# Patient Record
Sex: Male | Born: 1950 | Race: White | Hispanic: No | Marital: Married | Smoking: Never smoker
Health system: Southern US, Community
[De-identification: ages and names within clinical notes are randomized; demographics above are authoritative.]

## PROBLEM LIST (undated history)

## (undated) DIAGNOSIS — G629 Polyneuropathy, unspecified: Secondary | ICD-10-CM

## (undated) DIAGNOSIS — Z8679 Personal history of other diseases of the circulatory system: Secondary | ICD-10-CM

## (undated) DIAGNOSIS — E039 Hypothyroidism, unspecified: Secondary | ICD-10-CM

## (undated) DIAGNOSIS — G8929 Other chronic pain: Secondary | ICD-10-CM

## (undated) DIAGNOSIS — M21371 Foot drop, right foot: Secondary | ICD-10-CM

## (undated) DIAGNOSIS — N390 Urinary tract infection, site not specified: Secondary | ICD-10-CM

## (undated) DIAGNOSIS — E785 Hyperlipidemia, unspecified: Secondary | ICD-10-CM

## (undated) DIAGNOSIS — M21372 Foot drop, left foot: Secondary | ICD-10-CM

## (undated) DIAGNOSIS — F1120 Opioid dependence, uncomplicated: Secondary | ICD-10-CM

## (undated) DIAGNOSIS — F419 Anxiety disorder, unspecified: Secondary | ICD-10-CM

## (undated) DIAGNOSIS — I82409 Acute embolism and thrombosis of unspecified deep veins of unspecified lower extremity: Secondary | ICD-10-CM

## (undated) DIAGNOSIS — I509 Heart failure, unspecified: Secondary | ICD-10-CM

## (undated) DIAGNOSIS — E669 Obesity, unspecified: Secondary | ICD-10-CM

## (undated) DIAGNOSIS — F329 Major depressive disorder, single episode, unspecified: Secondary | ICD-10-CM

## (undated) DIAGNOSIS — R569 Unspecified convulsions: Secondary | ICD-10-CM

## (undated) DIAGNOSIS — R0602 Shortness of breath: Secondary | ICD-10-CM

## (undated) DIAGNOSIS — M8700 Idiopathic aseptic necrosis of unspecified bone: Secondary | ICD-10-CM

## (undated) DIAGNOSIS — K219 Gastro-esophageal reflux disease without esophagitis: Secondary | ICD-10-CM

## (undated) DIAGNOSIS — M549 Dorsalgia, unspecified: Secondary | ICD-10-CM

## (undated) DIAGNOSIS — R51 Headache: Secondary | ICD-10-CM

## (undated) DIAGNOSIS — M1990 Unspecified osteoarthritis, unspecified site: Secondary | ICD-10-CM

## (undated) DIAGNOSIS — G9340 Encephalopathy, unspecified: Secondary | ICD-10-CM

## (undated) DIAGNOSIS — G253 Myoclonus: Secondary | ICD-10-CM

## (undated) DIAGNOSIS — F32A Depression, unspecified: Secondary | ICD-10-CM

## (undated) DIAGNOSIS — R413 Other amnesia: Secondary | ICD-10-CM

## (undated) DIAGNOSIS — R269 Unspecified abnormalities of gait and mobility: Secondary | ICD-10-CM

## (undated) HISTORY — PX: JOINT REPLACEMENT: SHX530

## (undated) HISTORY — PX: HIP SURGERY: SHX245

## (undated) HISTORY — PX: BACK SURGERY: SHX140

## (undated) HISTORY — DX: Headache: R51

## (undated) HISTORY — DX: Personal history of other diseases of the circulatory system: Z86.79

## (undated) HISTORY — DX: Obesity, unspecified: E66.9

## (undated) HISTORY — PX: PAIN PUMP IMPLANTATION: SHX330

## (undated) HISTORY — PX: CERVICAL FUSION: SHX112

## (undated) HISTORY — PX: KNEE ARTHROSCOPY: SUR90

## (undated) HISTORY — DX: Unspecified abnormalities of gait and mobility: R26.9

---

## 2010-03-28 ENCOUNTER — Encounter: Admission: RE | Admit: 2010-03-28 | Discharge: 2010-03-28 | Payer: Self-pay | Admitting: Orthopedic Surgery

## 2010-05-25 LAB — DIFFERENTIAL
Basophils Absolute: 0 10*3/uL (ref 0.0–0.1)
Basophils Relative: 0 % (ref 0–1)
Eosinophils Absolute: 0.1 10*3/uL (ref 0.0–0.7)
Eosinophils Relative: 1 % (ref 0–5)
Lymphocytes Relative: 29 % (ref 12–46)
Lymphs Abs: 2.3 10*3/uL (ref 0.7–4.0)
Monocytes Absolute: 0.7 10*3/uL (ref 0.1–1.0)
Monocytes Relative: 9 % (ref 3–12)
Neutro Abs: 4.8 10*3/uL (ref 1.7–7.7)
Neutrophils Relative %: 60 % (ref 43–77)

## 2010-05-25 LAB — BASIC METABOLIC PANEL
BUN: 10 mg/dL (ref 6–23)
CO2: 30 mEq/L (ref 19–32)
Calcium: 9.4 mg/dL (ref 8.4–10.5)
Chloride: 104 mEq/L (ref 96–112)
Creatinine, Ser: 1.06 mg/dL (ref 0.4–1.5)
GFR calc Af Amer: >60 mL/min (ref >60)
GFR calc non Af Amer: >60 mL/min (ref >60)
Glucose, Bld: 105 mg/dL — ABNORMAL HIGH (ref 70–99)
Potassium: 4.5 mEq/L (ref 3.5–5.1)
Sodium: 143 mEq/L (ref 135–145)

## 2010-05-25 LAB — CBC
HCT: 47.7 % (ref 39.0–52.0)
Hemoglobin: 16.1 g/dL (ref 13.0–17.0)
MCH: 32.1 pg (ref 26.0–34.0)
MCHC: 33.8 g/dL (ref 30.0–36.0)
MCV: 95.2 fL (ref 78.0–100.0)
Platelets: 175 10*3/uL (ref 150–400)
RBC: 5.01 MIL/uL (ref 4.22–5.81)
RDW: 13.1 % (ref 11.5–15.5)
WBC: 7.9 10*3/uL (ref 4.0–10.5)

## 2010-05-25 LAB — SURGICAL PCR SCREEN
MRSA, PCR: NEGATIVE
Staphylococcus aureus: POSITIVE — AB

## 2010-05-25 LAB — PROTIME-INR
INR: 1.13 (ref 0.00–1.49)
Prothrombin Time: 14.7 seconds (ref 11.6–15.2)

## 2010-05-25 LAB — APTT: aPTT: 34 seconds (ref 24–37)

## 2010-05-27 LAB — URINALYSIS, ROUTINE W REFLEX MICROSCOPIC
Hgb urine dipstick: NEGATIVE
Nitrite: NEGATIVE
Protein, ur: NEGATIVE mg/dL
Specific Gravity, Urine: 1.035 — ABNORMAL HIGH (ref 1.005–1.030)
Urine Glucose, Fasting: NEGATIVE mg/dL
Urobilinogen, UA: 1 mg/dL (ref 0.0–1.0)
pH: 6 (ref 5.0–8.0)

## 2010-05-29 ENCOUNTER — Inpatient Hospital Stay (HOSPITAL_COMMUNITY)
Admission: RE | Admit: 2010-05-29 | Discharge: 2010-06-01 | Payer: Self-pay | Source: Home / Self Care | Attending: Orthopedic Surgery | Admitting: Orthopedic Surgery

## 2010-05-31 ENCOUNTER — Encounter: Payer: Self-pay | Admitting: Orthopedic Surgery

## 2010-06-01 LAB — TYPE AND SCREEN
ABO/RH(D): AB POS
Antibody Screen: NEGATIVE

## 2010-06-01 LAB — ABO/RH: ABO/RH(D): AB POS

## 2010-06-01 NOTE — Op Note (Signed)
NAMESIONE, BAUMGARTEN NO.:  [id number]  MEDICAL RECORD NO.:  [id number]          PATIENT TYPE:  INP  LOCATION:  0004                         FACILITY:  [facility]  PHYSICIAN:  Madlyn Frankel. Charlann Boxer, M.D.  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  PROCEDURE:  Left total hip replacement utilizing DePuy hip system, size 52 pinnacle cup, 36 +4 neutral AltrX liner, a size 6 high trial lock stem with a 36 +1.5 Delta ceramic ball.  SURGEON:  Madlyn Frankel. Charlann Boxer, M.D.  ASSISTANT:  Nelia Shi. Webb Silversmith, RN, nurse practitioner  ANESTHESIA:  General.  SPECIMENS:  None.  DRAINS:  One Hemovac.  COMPLICATIONS:  None.  BLOOD LOSS:  Approximately 800 mL.  INDICATIONS OF PROCEDURE:  Mr. Roessner is a 60 year old gentleman on chronic narcotic use for low back pain and multiple failed surgeries in his back.  He radiographically had diagnosis of avascular necrosis of both his hips.  After discussing with him this diagnosis and his chronic pain issues, we discussed the possibility of some pain relief by addressing his hips.  After identifying that he was failing conservative measures, he wished to proceed with left hip arthroplasty.  Risks of infection, DVT, component failure, dislocation, need for revision surgery for any reason were discussed.  Consent was obtained for the benefit of pain relief.  PROCEDURE IN DETAIL:  Patient was brought to the operative theater. Once adequate anesthesia and preoperative antibiotics, Ancef 2 grams was administered and patient was positioned into the right lateral decubitus position with the left side up.  Left lower extremity was then prepped and draped in a sterile fashion.  A time-out was  performed identifying the patient, planned procedure and extremity.  The lateral incision was made based off the trochanter.  Sharp dissection was carried to the iliotibial band and gluteal fascia.  This incised for posterior approach.  The patient was noted to be a large male with significant adipose tissue from the skin to the iliotibial band with an addition to a large muscle mass and adipose tissue in this gluteal region.  Short external rotators were identified and taken down separate from the posterior capsule.  An L capsulotomy was made preserving the posterior capsule for later anatomic repair, but also to protect the sciatic nerve from retractors.  Hip was dislocated.  A neck osteotomy made into the trochanteric fossa based off anatomic landmarks and preoperative templating the evaluation.  Attention was first directed to the femur.  Femoral retractors were placed.  The proximal femur was opened with starting drill hand, reamed once and then irrigated to try to prevent fat emboli, began to broach with the one broach and broached up to a size 6 broach, used a calcar planner, removed a little bit of more medial bone off the neck and calcar region.  I then packed off the femur now and attended to the  acetabulum.  The acetabular retractors were placed.  Labrum was debrided.  I began reaming.  The curved reamers were necessary due to his size and aided in the case significantly in order to prepare the bone adequately and reamed to 51 mm reamer with good bony bed preparation.  The size 52 cup was then chosen and using a curved impactor, I impacted the cup and it appeared based on evaluation with a cup guide to be positioned in approximately 4 degrees of abduction and appeared to be positioned adequately and anatomically with the cup beneath the anterior rim anteriorly and a portion of the cup exposed superolateral.  Given this and the insertion of the cup guide, I went ahead and  placed two screws to supplementally fix this cup and then placed a final 36 +4 neutral AltrX liner.  Trial reduction was now carried out with the six broach in place, a high offset neck and a 36 1.5 ball.  Combined anteversion appeared to be about 45-50 degrees.  There was very little shuck, but no evidence of impingement throughout range of motion.  Given these findings, the final six high trial lock stem was chosen.  The trial components were removed and the six high trial lock stem was then impacted and sat at the level where the broach was.  Based on this and my trial reduction, a 36 +1.5 Delta ceramic ball was chosen and impacted onto a clean and dry trunnion.  The hip was reduced.  We had irrigated the hip throughout the case and again at this point, I reapproximated the posterior capsule superior leaflet using #1 Vicryl.  A medium Hemovac drain was placed deep.  The remaining of the wound was closed with #1 Vicryl on the iliotibial band and gluteal fascia, 2-0 Vicryl in the subcutaneous layer and running 4-0 Monocryl.  The hip was cleaned, dried and dressed sterilely with Dermabond and Aquacel dressing.  The drain site was dressed separately.  He was then brought to the recovery room in stable condition tolerating the procedure well, extubated.     Madlyn Frankel Charlann Boxer, M.D.     MDO/MEDQ  D:  05/29/2010  T:  05/29/2010  Job:  [id number]  Electronically Signed by Durene Romans M.D. on 06/01/2010 09:37:53 AM

## 2010-06-01 NOTE — Discharge Summary (Signed)
NAMEEMBER, GOTTWALD NO.:  [id number]  MEDICAL RECORD NO.:  [id number]          PATIENT TYPE:  INP  LOCATION:  1611                         FACILITY:  [facility]  PHYSICIAN:  Madlyn Frankel. Charlann Boxer, M.D.  DATE OF BIRTH:  1950/11/06  DATE OF ADMISSION:  05/29/2010 DATE OF DISCHARGE:  06/01/2010                              DISCHARGE SUMMARY   ADMITTING DIAGNOSIS:  Left hip avascular necrosis.  DISCHARGE DIAGNOSES: 1. Left hip osteoarthritis. 2. Chronic pain. 3. Anxiety/depression. 4. Myoclonic seizures. 5. Hypercholesterolemia. 6. Reflux disease.  BRIEF ADMITTING HISTORY:  Alex Mcintosh is a 60 year old male who had presented office for evaluation of hip pain associated with longstanding chronic back pain due to failed back surgery.  Radiographs revealed concern to avascular necrosis in bilateral hips.  After reviewing with him the findings, the location of his pain and association with his chronic pain, he wished at this point to proceed with hip arthroplasty in a  staged fashion.  His left hip was hurting more than the right.  We had reviewed the risks and benefits of the procedures in the office and he was scheduled for same-day admission surgery.  BRIEF HOSPITAL COURSE:  The patient admitted for same-day surgery on May 29, 2010.  He underwent a left total hip replacement.  Please see dictated operative note for full details of the procedure. Postoperatively, after routine stay in the recovery room, he was transferred to orthopedic ward where he remained over the weekend.  He was seen and evaluated by Physical Therapy.  His Foley had been discharged on postop day #1.  He did have problems voiding, required catheter placement of Foley.  On postop day #3, the Foley was removed with plans to continue followup voiding at home.  He was seen and evaluated by Physical Therapy, was designated to be weightbearing as tolerated.  He progressed with recommendations  for strength and training with Home Health Physical Therapy.  He was started on Xarelto as blood thinner as well as early mobilization, had mechanical devices SCDs and TED hose placed.  He was on a regular diet tolerating this well.  By postop day #3, he was ready for discharge.  DISCHARGE INSTRUCTIONS:  The patient will be discharged to home with home health physical therapy.  He has a Aquacel dressing that will remain in place for 1 week.  He will remove this and then apply gauze and tape.  He may shower whenever as this is a waterproof dressing.  He will be seen and evaluated with therapy for weightbearing training, gait training and strengthening.  He will return to [facility] in 2 weeks to follow up with Dr. Durene Romans.  If there are orthopedic questions that arise, he should contact us at [phone].  Due to his chronic pain and pain management issues, if he has questions regarding his pain medication usage, then he should contact his pain specialist.  DIET:  He will be on a regular diet at home.  DISCHARGE MEDICATIONS:  His discharge medication include Colace 100 mg p.o. b.i.d. for constipation while on pain medicine, ferrous sulfate 325  mg 2 to 3 times a day for 2 weeks, Robaxin 500 mg every 6 hours as needed for pain, MiraLax 17 g p.o. daily as needed for constipation while on pain medicines, Xarelto 10 mg p.o. daily for 10 days, aspirin. Aspirin instructions are to be as follows, while he is on Xarelto, he will hold off on aspirin.  After the 10 mg of Xarelto, he will get regular strength aspirin for 4 weeks and then back to his 81 mg. Carisoprodol 350 mg p.o. q.i.d., Cymbalta 60 mg 2 tablets q.a.m., divalproex 500 mg q.h.s., gabapentin 600 mg 2 tablets q.i.d., Lunesta 3 mg q.h.s., multivitamins p.o. daily, Nucynta 100 mg q.i.d. as needed for pain, Nucynta ER 250 mg b.i.d., omeprazole 20 mg q.a.m., propranolol 20 mg p.o. b.i.d., testosterone gel topically  every morning, Zetia 10 mg q.a.m..  In the hospital, he was given prescription for the Xarelto and Nucynta 75 mg.  Again, I had instructed him to contact his pain specialist if he had questions or concerns regarding his pain medication usage due to the significant amount of medicine he is on.     Madlyn Frankel Charlann Boxer, M.D.     MDO/MEDQ  D:  06/01/2010  T:  06/01/2010  Job:  [id number]  Electronically Signed by Durene Romans M.D. on 06/01/2010 09:37:57 AM

## 2010-06-01 NOTE — H&P (Signed)
NAMELABRANDON, Mcintosh NO.:  [id number]  MEDICAL RECORD NO.:  [id number]          PATIENT TYPE:  INP  LOCATION:  NA                           FACILITY:  [facility]  PHYSICIAN:  Madlyn Frankel. Charlann Boxer, M.D.  DATE OF BIRTH:  26-Apr-1951  DATE OF ADMISSION: DATE OF DISCHARGE:                             HISTORY & PHYSICAL   ADMISSION DIAGNOSES:  Left hip osteoarthritis and avascular necrosis.  BRIEF HISTORY:  This is a patient who was seen by Dr. Charlann Boxer earlier in January for bilateral AVN, left greater than right in addition to multiple failed back surgery attempts and persistent chronic pain with chronic pain management.  The patient is scheduled to have the total left hip replacement on January 20.  He does have some avascular changes and advanced proximal hip pain.  He is also having some right-sided hip pain that is progressive, but he has waxing and waning with proceeding with the surgery, but right now it is scheduled for January 20.  PAST MEDICAL HISTORY:  Significant for anxiety and depression.  He has a new history of about 1 year of myoclonic seizures.  He has elevated cholesterol, reflux disease, history of fractures in his leg.  He has generalized osteoarthritis with degenerative disk disease, failed back surgery, staph infection, myofascial syndrome, measles and mumps as a child.  He also has some memory loss with fatigue, vertigo and transient tremors especially in his hands.  He does have a weak urinary stream.  PAST SURGICAL HISTORY:  Includes back surgery in 1984, two in 1986 with complications and one in 1994 with complications.  Neck fusion in 1990. He had a morphine pump installed in 1993 and had numerous procedures to work on that through 2008.  He has had knee surgeries in 1980 on the right and left in 1993.  CURRENT MEDICATION:  Nucynta ER, soma, Byetta, Neurontin, Cymbalta, Lunesta, Prilosec and propranolol.  He does not know the doses of any of his  medicines.  ALLERGIES:  He has medication allergies to CEFTIN and STATINS.  SOCIAL HISTORY:  The patient is married.  He does not give an occupation.  He has no history of tobacco use, alcohol use or drug abuse.  He has 3 children.  FAMILY HISTORY:  His father died at 80, his mother at 65.  He has 2 siblings.  REVIEW OF SYSTEMS:  Notable for those difficulties described in history present illness and past medical history.  Review of systems sheet is otherwise unremarkable.  PHYSICAL EXAMINATION:  VITAL SIGNS:  The patient is 5 feet 10 inches, 310 pounds.  Blood pressure is 140/80.  His respirations are 20.  His pulse is 88. GENERAL:  His general health is poor.  He does have a history of being staph positive.  Had MMR as a child. HEENT:  Shows that he wears glasses.  He does have transient tremors. NECK AND CHEST:  Unremarkable. LUNGS:  Clear to auscultation bilaterally. HEART:  Has S1, S2.  He has no murmurs, rubs or gallops.  He does have a history of high cholesterol. ABDOMEN:  Pendulous.  He does have a history of GERD. GU:  Shows history of weak stream. EXTREMITIES:  Shows a history of fractures, osteoarthritis and degenerative disk disease with failed back surgeries. DERMATOLOGICAL:  He is intact. NEUROLOGICAL:  He has a history of myoclonic seizures, anxiety, depression and decreased memory.  He has fatigue syndrome as well as vertigo and decreased balance problems.  IMPRESSION:  Left hip osteoarthritis and avascular necrosis.  PLAN:  He is to be admitted on January 20 for a left total hip arthroplasty with Dr. Charlann Boxer.  His questions were encouraged and answered. His discharge medications including Robaxin, iron, MiraLax and Colace were given to him today.  His DVT prevention and pain medicine will be given to him upon discharge.     Alex L. Webb Silversmith, RN   ______________________________ Madlyn Frankel Charlann Boxer, M.D.    RLW/MEDQ  D:  05/21/2010  T:  05/21/2010  Job:   [id number]  Electronically Signed by Lauree Chandler NP-C on 05/29/2010 09:48:15 AM Electronically Signed by Durene Romans M.D. on 06/01/2010 09:36:43 AM

## 2010-06-02 LAB — BASIC METABOLIC PANEL
BUN: 6 mg/dL (ref 6–23)
BUN: 5 mg/dL — ABNORMAL LOW (ref 6–23)
CO2: 31 mEq/L (ref 19–32)
CO2: 33 mEq/L — ABNORMAL HIGH (ref 19–32)
Calcium: 8.7 mg/dL (ref 8.4–10.5)
Calcium: 8.3 mg/dL — ABNORMAL LOW (ref 8.4–10.5)
Chloride: 103 mEq/L (ref 96–112)
Chloride: 102 mEq/L (ref 96–112)
Creatinine, Ser: 0.68 mg/dL (ref 0.4–1.5)
Creatinine, Ser: 0.86 mg/dL (ref 0.4–1.5)
GFR calc Af Amer: >60 mL/min (ref >60)
GFR calc Af Amer: >60 mL/min (ref >60)
GFR calc non Af Amer: >60 mL/min (ref >60)
GFR calc non Af Amer: >60 mL/min (ref >60)
Glucose, Bld: 172 mg/dL — ABNORMAL HIGH (ref 70–99)
Glucose, Bld: 128 mg/dL — ABNORMAL HIGH (ref 70–99)
Potassium: 4.7 mEq/L (ref 3.5–5.1)
Potassium: 4.3 mEq/L (ref 3.5–5.1)
Sodium: 141 mEq/L (ref 135–145)
Sodium: 141 mEq/L (ref 135–145)

## 2010-06-02 LAB — CBC
HCT: 38.6 % — ABNORMAL LOW (ref 39.0–52.0)
HCT: 35.1 % — ABNORMAL LOW (ref 39.0–52.0)
Hemoglobin: 13 g/dL (ref 13.0–17.0)
Hemoglobin: 11.9 g/dL — ABNORMAL LOW (ref 13.0–17.0)
MCH: 30.7 pg (ref 26.0–34.0)
MCH: 31.1 pg (ref 26.0–34.0)
MCHC: 33.7 g/dL (ref 30.0–36.0)
MCHC: 33.9 g/dL (ref 30.0–36.0)
MCV: 91.3 fL (ref 78.0–100.0)
MCV: 91.6 fL (ref 78.0–100.0)
Platelets: 160 10*3/uL (ref 150–400)
Platelets: 136 10*3/uL — ABNORMAL LOW (ref 150–400)
RBC: 4.23 MIL/uL (ref 4.22–5.81)
RBC: 3.83 MIL/uL — ABNORMAL LOW (ref 4.22–5.81)
RDW: 12.8 % (ref 11.5–15.5)
RDW: 13 % (ref 11.5–15.5)
WBC: 11.1 10*3/uL — ABNORMAL HIGH (ref 4.0–10.5)
WBC: 10.5 10*3/uL (ref 4.0–10.5)

## 2011-06-01 ENCOUNTER — Emergency Department (HOSPITAL_BASED_OUTPATIENT_CLINIC_OR_DEPARTMENT_OTHER)
Admission: EM | Admit: 2011-06-01 | Discharge: 2011-06-02 | Disposition: A | Discharge status: Home / Self Care | Payer: Medicare Other | Attending: Emergency Medicine | Admitting: Emergency Medicine

## 2011-06-01 ENCOUNTER — Encounter (HOSPITAL_BASED_OUTPATIENT_CLINIC_OR_DEPARTMENT_OTHER): Payer: Self-pay | Admitting: Emergency Medicine

## 2011-06-01 DIAGNOSIS — E785 Hyperlipidemia, unspecified: Secondary | ICD-10-CM | POA: Insufficient documentation

## 2011-06-01 DIAGNOSIS — M549 Dorsalgia, unspecified: Secondary | ICD-10-CM | POA: Insufficient documentation

## 2011-06-01 DIAGNOSIS — K219 Gastro-esophageal reflux disease without esophagitis: Secondary | ICD-10-CM | POA: Insufficient documentation

## 2011-06-01 DIAGNOSIS — G8929 Other chronic pain: Secondary | ICD-10-CM | POA: Insufficient documentation

## 2011-06-01 DIAGNOSIS — S01309A Unspecified open wound of unspecified ear, initial encounter: Secondary | ICD-10-CM | POA: Insufficient documentation

## 2011-06-01 DIAGNOSIS — S08119A Complete traumatic amputation of unspecified ear, initial encounter: Secondary | ICD-10-CM

## 2011-06-01 DIAGNOSIS — W1809XA Striking against other object with subsequent fall, initial encounter: Secondary | ICD-10-CM | POA: Insufficient documentation

## 2011-06-01 DIAGNOSIS — Y92009 Unspecified place in unspecified non-institutional (private) residence as the place of occurrence of the external cause: Secondary | ICD-10-CM | POA: Insufficient documentation

## 2011-06-01 DIAGNOSIS — IMO0002 Reserved for concepts with insufficient information to code with codable children: Secondary | ICD-10-CM | POA: Insufficient documentation

## 2011-06-01 DIAGNOSIS — G40909 Epilepsy, unspecified, not intractable, without status epilepticus: Secondary | ICD-10-CM | POA: Insufficient documentation

## 2011-06-01 HISTORY — DX: Hyperlipidemia, unspecified: E78.5

## 2011-06-01 HISTORY — DX: Other chronic pain: G89.29

## 2011-06-01 HISTORY — DX: Unspecified convulsions: R56.9

## 2011-06-01 HISTORY — DX: Dorsalgia, unspecified: M54.9

## 2011-06-01 HISTORY — DX: Anxiety disorder, unspecified: F41.9

## 2011-06-01 HISTORY — DX: Gastro-esophageal reflux disease without esophagitis: K21.9

## 2011-06-01 MED ORDER — LIDOCAINE-EPINEPHRINE 2 %-1:100000 IJ SOLN
INTRAMUSCULAR | Status: AC
  Filled 2011-06-01: qty 1

## 2011-06-01 NOTE — ED Notes (Signed)
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

## 2011-06-01 NOTE — ED Provider Notes (Signed)
Patient has arrived from [facility] to see Dr. Jeanice Lim for evaluation and repair of traumatic amputation of portion of left ear.  Dr. Jeanice Lim notified of patient's arrival.  Patient resting comfortably at present with family at bedside.  Lungs CTA bilaterally.  S1/ S2, RRR, no murmur.  No active bleeding from amputation site.    Jimmye Norman, NP 06/01/11 2209

## 2011-06-01 NOTE — ED Notes (Signed)
Report to Felicie Morn PA. Pt is being transported via W/C.

## 2011-06-01 NOTE — ED Notes (Signed)
Dr. Jeanice Lim at bedside for laceration evaluation and suturing, family at bedside. Will continue to monitor pt.

## 2011-06-01 NOTE — ED Notes (Signed)
Pt is coming to [facility] to meet Dr Jeanice Lim.

## 2011-06-01 NOTE — ED Notes (Signed)
Pt resting quietly, no s/s of any pain to distress noted. Pt denies any pain or complaints at this time, dressing over left ear for bleeding control. Spouse at bedside with piece of ear in a specimen jar. Pt reports "tripped and fell off the porch and hit a 4x4 piece of wood". Pt denies any dizziness prior or LOC post fall. Pt INAD, resp e/u, skin w/d and A/O x3.

## 2011-06-01 NOTE — ED Provider Notes (Addendum)
History     CSN: [id number]  Arrival date & time 06/01/11  1949   First MD Initiated Contact with Patient 06/01/11 2020      Chief Complaint  Patient presents with  . Fall  . Ear Laceration   patient fell onto her chin hit his head on a wooden post. Apparently, sustained laceration or partial amputation of the left ear pinna. He did package of the amputated portion. The ear and put it in paper towel. The wife states this happened a proximally 7 PM or shortly thereafter. He also sustained a small abrasion to his back however denies any other trauma. Denied any neck pain or head injury. The wife states his tetanus is up-to-date. He does take an aspirin but apparently not on any other blood thinners.  (Consider location/radiation/quality/duration/timing/severity/associated sxs/prior treatment) HPI  Past Medical History  Diagnosis Date  . Hyperlipemia   . Chronic back pain   . Seizures   . GERD (gastroesophageal reflux disease)   . Anxiety     Past Surgical History  Procedure Date  . Back surgery     No family history on file.  History  Substance Use Topics  . Smoking status: Never Smoker   . Smokeless tobacco: Not on file  . Alcohol Use: No      Review of Systems  All other systems reviewed and are negative.    Allergies  Ceftin and Statins  Home Medications  No current outpatient prescriptions on file.  BP 141/76  Pulse 79  Temp(Src) 98.3 F (36.8 C) (Oral)  Resp 16  Ht 5\' 10"  (1.778 m)  Wt 290 lb (131.543 kg)  BMI 41.61 kg/m2  SpO2 98%  Physical Exam  Nursing note and vitals reviewed. Constitutional: He is oriented to person, place, and time. He appears well-developed and well-nourished.  HENT:  Head: Normocephalic.  Right Ear: External ear normal.       Partial amputation involving the soft tissues of the left year, the pinna or the lobe. Cartilage exposed. No active bleeding  Eyes: Conjunctivae and EOM are normal. Pupils are equal, round, and  reactive to light.  Neck: Neck supple.  Cardiovascular: Normal rate and regular rhythm.  Exam reveals no gallop and no friction rub.   No murmur heard. Pulmonary/Chest: Breath sounds normal. He has no wheezes. He has no rales. He exhibits no tenderness.  Abdominal: Soft. Bowel sounds are normal. He exhibits no distension. There is no tenderness. There is no rebound and no guarding.  Musculoskeletal: Normal range of motion.  Neurological: He is alert and oriented to person, place, and time. No cranial nerve deficit. Coordination normal.  Skin: Skin is warm and dry. No rash noted.  Psychiatric: He has a normal mood and affect.    ED Course  Procedures (including critical care time)  Labs Reviewed - No data to display No results found.   No diagnosis found.    MDM  Pt is seen and examined;  Initial history and physical completed.  Will follow.     8:31 PM  Plastic surgery/maxillofacial trauma. Has been paged. Will request their formal evaluation of the patient.   Await call back   8:52 PM  The patient will be transferred to the [facility] for formal evaluation by the maxillofacial surgeon, Dr. Jeanice Lim. He has accepted the patient, who can come by private vehicle. Dr. Patria Mane, and Felicie Morn were also notified. The patient and his family are amenable to go by private vehicle.\\  A sterile dressing has been placed to the injured year. Will place, the amputated portion in gauze and saline and a sterile container.   Alyss Granato A. Patrica Duel, MD 06/01/11 2053  Lorelle Gibbs. Patrica Duel, MD 06/02/11 1610

## 2011-06-01 NOTE — ED Notes (Signed)
Portion of patient's ear was brought in via family wrapped in paper towels; ear was cleaned with saline and placed in saline.  Patient's ear had been dressed via EMS at the patient's home.  Dressing removed.  Wound cleaned with saline.  Dry dressing replaced.  Bleeding controlled.

## 2011-06-01 NOTE — ED Notes (Signed)
Report received by Lamonte Richer at [facility]. Will assume care of pt at this time.

## 2011-06-02 MED ORDER — CLINDAMYCIN HCL 150 MG PO CAPS: 300.0000 mg | ORAL_CAPSULE | Freq: Three times a day (TID) | ORAL | Status: AC

## 2011-06-02 MED ORDER — HYDROCODONE-ACETAMINOPHEN 5-500 MG PO TABS: 1.0000 | ORAL_TABLET | Freq: Four times a day (QID) | ORAL | Status: AC | PRN

## 2011-06-02 NOTE — ED Provider Notes (Signed)
Medical screening examination/treatment/procedure(s) were performed by non-physician practitioner and as supervising physician I was immediately available for consultation/collaboration.   Lyanne Co, MD 06/02/11 [phone]

## 2011-06-02 NOTE — Consult Note (Signed)
Reason for Consult:Left Ear Laceration  Referring Physician: Felicie Morn, NP   Alex Mcintosh is an 61 y.o. male.  HPI: Alex Mcintosh sustained a ground level fall while entering his home this evening as he was being chased by two dogs from his neighborhood.     PMHx:  Past Medical History  Diagnosis Date  . Hyperlipemia   . Chronic back pain   . Seizures   . GERD (gastroesophageal reflux disease)   . Anxiety     PSx:  Past Surgical History  Procedure Date  . Back surgery     Family Hx: No family history on file.  Social Hx:  reports that he has never smoked. He does not have any smokeless tobacco history on file. He reports that he does not drink alcohol or use illicit drugs.  Allergies:  Allergies  Allergen Reactions  . Ceftin (Cefuroxime Axetil) Hives  . Statins     Messes with liver enzymes    Medications: I have reviewed the patient's current medications.  Labs: No results found for this or any previous visit (from the past 48 hour(s)).  Radiology: No results found.  ROS:A comprehensive review of systems was negative.  Vital Signs: BP 141/76  Pulse 79  Temp(Src) 98.3 F (36.8 C) (Oral)  Resp 16  Ht 5\' 10"  (1.778 m)  Wt 131.543 kg (290 lb)  BMI 41.61 kg/m2  SpO2 98%  Physical Exam: General appearance: alert and cooperative Head: Normocephalic, without obvious abnormality, atraumatic Eyes: conjunctivae/corneas clear. PERRL, EOM's intact. Fundi benign. Ears: normal TM's and external ear canals both ears Nose: Nares normal. Septum midline. Mucosa normal. No drainage or sinus tenderness. Throat: lips, mucosa, and tongue normal; teeth and gums normal Left ear avulsion at the most distal aspect of the ear from lobe to helix measuring vertically approximately 7 cm, minimal cartilage involved.  There was a complete transection of the portion of ear.  Cranial Nerves grossly intact Occlusion stable and repeatable.  Assessment/Plan: Left Ear Laceration/Avulsion 1. Copious  Irrigation with saline + betadine 2. 10 mL 1% Lidocaine with 1:100,000 epi and 6 mL of 1% Lidocaine plain 3. Closed deep cartilage with 4.0 Vicryl, Closed superficial skin with 5.0 Prolene, Placed bolster dressing (gauze rolls on anterior and posterior aspect of ear secured with 3.0 nylon. 4. Discharge with follow up at my office on Friday 06/03/2010 call for appointment time [phone] 5. Discharge with clindamycin 300 mg tid x 7 days, pain medication, and topical bacitracin.    [place],Etna Forquer L  06/02/2011, 12:24 AM

## 2011-06-02 NOTE — ED Notes (Signed)
MD [facility] completed procedure at bedside and pt will be discharge per MD orders. Will continue to monitor pt, pt is awaiting discharge.

## 2011-06-02 NOTE — ED Notes (Signed)
Pt denies any question or pain upon discharge. 

## 2012-04-26 ENCOUNTER — Emergency Department (HOSPITAL_COMMUNITY): Payer: Medicare Other

## 2012-04-26 ENCOUNTER — Encounter (HOSPITAL_COMMUNITY): Payer: Self-pay | Admitting: Emergency Medicine

## 2012-04-26 ENCOUNTER — Emergency Department (HOSPITAL_COMMUNITY)
Admission: EM | Admit: 2012-04-26 | Discharge: 2012-04-26 | Disposition: A | Discharge status: Home / Self Care | Payer: Medicare Other | Attending: Emergency Medicine | Admitting: Emergency Medicine

## 2012-04-26 DIAGNOSIS — Z79899 Other long term (current) drug therapy: Secondary | ICD-10-CM | POA: Insufficient documentation

## 2012-04-26 DIAGNOSIS — Z8739 Personal history of other diseases of the musculoskeletal system and connective tissue: Secondary | ICD-10-CM | POA: Insufficient documentation

## 2012-04-26 DIAGNOSIS — S8990XA Unspecified injury of unspecified lower leg, initial encounter: Secondary | ICD-10-CM | POA: Insufficient documentation

## 2012-04-26 DIAGNOSIS — Z8719 Personal history of other diseases of the digestive system: Secondary | ICD-10-CM | POA: Insufficient documentation

## 2012-04-26 DIAGNOSIS — I82409 Acute embolism and thrombosis of unspecified deep veins of unspecified lower extremity: Secondary | ICD-10-CM | POA: Insufficient documentation

## 2012-04-26 DIAGNOSIS — Y92009 Unspecified place in unspecified non-institutional (private) residence as the place of occurrence of the external cause: Secondary | ICD-10-CM | POA: Insufficient documentation

## 2012-04-26 DIAGNOSIS — Z96659 Presence of unspecified artificial knee joint: Secondary | ICD-10-CM | POA: Insufficient documentation

## 2012-04-26 DIAGNOSIS — F411 Generalized anxiety disorder: Secondary | ICD-10-CM | POA: Insufficient documentation

## 2012-04-26 DIAGNOSIS — Z7901 Long term (current) use of anticoagulants: Secondary | ICD-10-CM | POA: Insufficient documentation

## 2012-04-26 DIAGNOSIS — Y9389 Activity, other specified: Secondary | ICD-10-CM | POA: Insufficient documentation

## 2012-04-26 DIAGNOSIS — W19XXXA Unspecified fall, initial encounter: Secondary | ICD-10-CM

## 2012-04-26 DIAGNOSIS — R296 Repeated falls: Secondary | ICD-10-CM | POA: Insufficient documentation

## 2012-04-26 DIAGNOSIS — E785 Hyperlipidemia, unspecified: Secondary | ICD-10-CM | POA: Insufficient documentation

## 2012-04-26 LAB — PROTIME-INR: Prothrombin Time: 37.9 seconds — ABNORMAL HIGH (ref 11.6–15.2)

## 2012-04-26 NOTE — ED Notes (Signed)
Pt was walking without his walker at home and fell from standing position. No LOC. Did not hit head. Floor was carpet. L leg pain. Chronic pain in that leg and blood clot in leg. On coumadin. Multiple lumbar surgeries. No deformity.

## 2012-04-26 NOTE — ED Provider Notes (Signed)
History     CSN: [id number]  Arrival date & time 04/26/12  1910   First MD Initiated Contact with Patient 04/26/12 2010      Chief Complaint  Patient presents with  . Fall   HPI  History provided by the patient. 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 Patient normally moves around with a walker at home was getting up to use bathroom without his walker. Patient reports that his left leg had sudden pain and gave out on him causing him to fall. He denies any significant head injury and there was no LOC. He denies having any significant pain or injuries. He has some soreness to his left side and leg after the fall. Denies any numbness or weakness. Denies having any chest pain, shortness of breath or heart palpitations prior to or after the fall. He denies any headache. Patient has chronic neck and back pains but these are unchanged. No history of urinary or fecal incontinence. No reports of possible seizure activity.    Past Medical History  Diagnosis Date  . Hyperlipemia   . Chronic back pain   . Seizures   . GERD (gastroesophageal reflux disease)   . Anxiety     Past Surgical History  Procedure Date  . Back surgery     History reviewed. No pertinent family history.  History  Substance Use Topics  . Smoking status: Never Smoker   . Smokeless tobacco: Not on file  . Alcohol Use: No      Review of Systems  Respiratory: Negative for shortness of breath.   Cardiovascular: Negative for chest pain and palpitations.  Neurological: Negative for dizziness, light-headedness and headaches.  All other systems reviewed and are negative.    Allergies  Ceftin and Statins  Home Medications   Current Outpatient Rx  Name  Route  Sig  Dispense  Refill  . CARISOPRODOL 350 MG PO TABS   Oral   Take 350 mg by mouth  4 (four) times daily.         Marland Kitchen DIAZEPAM 5 MG PO TABS   Oral   Take 5 mg by mouth 4 (four) times daily. For anxiety         . DULOXETINE HCL 60 MG PO CPEP   Oral   Take 120 mg by mouth daily.         Marland Kitchen ESZOPICLONE 3 MG PO TABS   Oral   Take 3 mg by mouth at bedtime. Take immediately before bedtime         . EZETIMIBE 10 MG PO TABS   Oral   Take 10 mg by mouth daily.         Marland Kitchen GABAPENTIN 600 MG PO TABS   Oral   Take 600 mg by mouth 2 (two) times daily.          . ADULT MULTIVITAMIN W/MINERALS CH   Oral   Take 1 tablet by mouth daily.         Marland Kitchen OMEPRAZOLE 20 MG PO CPDR   Oral   Take 20 mg by mouth daily.         Marland Kitchen OXYMORPHONE HCL 10 MG PO TABS   Oral   Take 10 mg by mouth 5 (five) times daily.         Marland Kitchen PROPRANOLOL HCL 20 MG  PO TABS   Oral   Take 20 mg by mouth 2 (two) times daily.         Raylene Miyamoto ER PO   Oral   Take 250 mg by mouth 2 (two) times daily.         . TESTOSTERONE 12.5 MG/ACT (1%) TD GEL   Transdermal   Place 1 patch onto the skin daily.         . WARFARIN SODIUM 5 MG PO TABS   Oral   Take 5-7.5 mg by mouth every morning. 1 tablet daily except on Wednesday patient takes 1.5 tablets           BP 160/83  Pulse 92  Temp 98.1 F (36.7 C) (Oral)  Resp 16  SpO2 90%  Physical Exam  Nursing note and vitals reviewed. Constitutional: He is oriented to person, place, and time. He appears well-developed and well-nourished. No distress.  HENT:  Head: Normocephalic and atraumatic.       No battle sign or raccoon signs  Eyes: EOM are normal. Pupils are equal, round, and reactive to light.       Right conjunctiva injected. No significant ankle edema. No discharge.  Neck: Normal range of motion. Neck supple.       No cervical midline tenderness. Mild paraspinous tenderness reported to be at baseline of chronic pains  Cardiovascular: Normal rate and regular rhythm.   Pulmonary/Chest: Effort normal and breath sounds normal. No  respiratory distress. He has no wheezes.  Abdominal: Soft. There is no tenderness.       Obese  Musculoskeletal:       Increased edema of left lower extremity. Normal dorsal pedal pulses and feet bilaterally. Mild tenderness over the left lateral hip area. No deformities. No rotation or shortening of the left leg. No pain to palpation over the foot, ankle or knee. Normal passive range of motion. Normal distal sensations and feet.  Normal upper extremity exam. Strength equal bilaterally in all extremities.  Neurological: He is alert and oriented to person, place, and time. He has normal strength. No cranial nerve deficit or sensory deficit. Coordination and gait normal.  Skin: Skin is warm. No erythema.  Psychiatric: He has a normal mood and affect. His behavior is normal.    ED Course  Procedures   Results for orders placed during the hospital encounter of 04/26/12  PROTIME-INR      Component Value Range   Prothrombin Time 37.9 (*) 11.6 - 15.2 seconds   INR 4.20 (*) 0.00 - 1.49      Dg Hip Complete Left  04/26/2012  *RADIOLOGY REPORT*  Clinical Data: Fall.  Left hip pain hip replacement 1 year ago  LEFT HIP - COMPLETE 2+ VIEW  Comparison: 05/29/2010  Findings: Left hip replacement in satisfactory position and alignment.  No fracture .  Mild lucency around the  femoral prosthesis may represent mild loosening of the femoral stem.  IMPRESSION:   Negative for fracture.  Question mild loosening around the femoral prosthesis.   Original Report Authenticated By: Janeece Riggers, M.D.      1. Fall       MDM  8:30 PM patient seen and evaluated. Patient resting comfortably without any acute distress at this time. Patient has no significant complaints of discomfort.  X-rays unremarkable. Patient is able to stand and bear weight without significant discomfort. Patient is aware of elevated INR level and are starting made reduction in his normal Coumadin dosage. He has close  followup with an  upcoming appointment scheduled. Patient was instructed to notify PCP of fall tonight. Patient also instructed to use walker every time he is up to move around.  Patient was discussed with attending physician. Patient does have elevated INR but no other signs of injuries from a fall. No signs of trauma to the head and patient denies any head injury, headache or LOC.    Angus Seller, [place] 04/27/12 [phone]

## 2012-04-26 NOTE — ED Notes (Signed)
[id number]<AV> Expected date:<BR> Expected time:<BR> Means of arrival:Ambulance<BR> Comments:<BR> 61 yom-fall/

## 2012-04-27 NOTE — ED Provider Notes (Signed)
Medical screening examination/treatment/procedure(s) were performed by non-physician practitioner and as supervising physician I was immediately available for consultation/collaboration.  Luvinia Lucy R. Timmey Lamba, MD 04/27/12 2358 

## 2012-06-13 ENCOUNTER — Observation Stay (HOSPITAL_COMMUNITY)
Admission: EM | Admit: 2012-06-13 | Discharge: 2012-06-13 | Discharge status: Against Medical Advice | Payer: Medicare Other | Attending: Emergency Medicine | Admitting: Emergency Medicine

## 2012-06-13 ENCOUNTER — Emergency Department (HOSPITAL_COMMUNITY): Payer: Medicare Other

## 2012-06-13 ENCOUNTER — Encounter (HOSPITAL_COMMUNITY): Payer: Self-pay | Admitting: *Deleted

## 2012-06-13 DIAGNOSIS — M7989 Other specified soft tissue disorders: Principal | ICD-10-CM

## 2012-06-13 DIAGNOSIS — L988 Other specified disorders of the skin and subcutaneous tissue: Secondary | ICD-10-CM | POA: Insufficient documentation

## 2012-06-13 DIAGNOSIS — Z86718 Personal history of other venous thrombosis and embolism: Secondary | ICD-10-CM | POA: Diagnosis not present

## 2012-06-13 DIAGNOSIS — R238 Other skin changes: Secondary | ICD-10-CM

## 2012-06-13 DIAGNOSIS — K219 Gastro-esophageal reflux disease without esophagitis: Secondary | ICD-10-CM | POA: Diagnosis not present

## 2012-06-13 DIAGNOSIS — I517 Cardiomegaly: Secondary | ICD-10-CM | POA: Diagnosis not present

## 2012-06-13 DIAGNOSIS — M79609 Pain in unspecified limb: Secondary | ICD-10-CM

## 2012-06-13 DIAGNOSIS — R0989 Other specified symptoms and signs involving the circulatory and respiratory systems: Secondary | ICD-10-CM | POA: Diagnosis not present

## 2012-06-13 DIAGNOSIS — Z79899 Other long term (current) drug therapy: Secondary | ICD-10-CM | POA: Diagnosis not present

## 2012-06-13 DIAGNOSIS — Z7901 Long term (current) use of anticoagulants: Secondary | ICD-10-CM | POA: Insufficient documentation

## 2012-06-13 DIAGNOSIS — E785 Hyperlipidemia, unspecified: Secondary | ICD-10-CM | POA: Insufficient documentation

## 2012-06-13 DIAGNOSIS — R0902 Hypoxemia: Secondary | ICD-10-CM

## 2012-06-13 LAB — URINALYSIS, ROUTINE W REFLEX MICROSCOPIC
Bilirubin Urine: NEGATIVE
Glucose, UA: NEGATIVE mg/dL
Hgb urine dipstick: NEGATIVE
Ketones, ur: NEGATIVE mg/dL
pH: 5.5 (ref 5.0–8.0)

## 2012-06-13 LAB — CBC WITH DIFFERENTIAL/PLATELET
Basophils Absolute: 0 10*3/uL (ref 0.0–0.1)
Basophils Relative: 0 % (ref 0–1)
Eosinophils Absolute: 0.1 10*3/uL (ref 0.0–0.7)
Lymphs Abs: 2.6 10*3/uL (ref 0.7–4.0)
MCH: 28.8 pg (ref 26.0–34.0)
Neutrophils Relative %: 52 % (ref 43–77)
Platelets: 184 10*3/uL (ref 150–400)
RBC: 4.69 MIL/uL (ref 4.22–5.81)
WBC: 6.9 10*3/uL (ref 4.0–10.5)

## 2012-06-13 LAB — COMPREHENSIVE METABOLIC PANEL
ALT: 15 U/L (ref 0–53)
AST: 29 U/L (ref 0–37)
Albumin: 3.1 g/dL — ABNORMAL LOW (ref 3.5–5.2)
Alkaline Phosphatase: 110 U/L (ref 39–117)
Glucose, Bld: 98 mg/dL (ref 70–99)
Potassium: 4.2 mEq/L (ref 3.5–5.1)
Sodium: 138 mEq/L (ref 135–145)
Total Protein: 6.7 g/dL (ref 6.0–8.3)

## 2012-06-13 LAB — APTT: aPTT: 61 seconds — ABNORMAL HIGH (ref 24–37)

## 2012-06-13 LAB — PRO B NATRIURETIC PEPTIDE: Pro B Natriuretic peptide (BNP): 120.4 pg/mL (ref 0–125)

## 2012-06-13 MED ORDER — FUROSEMIDE 20 MG PO TABS: 20.0000 mg | ORAL_TABLET | Freq: Every day | ORAL | Status: DC

## 2012-06-13 MED ORDER — FUROSEMIDE 10 MG/ML IJ SOLN
40.0000 mg | Freq: Once | INTRAMUSCULAR | Status: AC
  Administered 2012-06-13: 40 mg via INTRAVENOUS
  Filled 2012-06-13: qty 4

## 2012-06-13 NOTE — ED Notes (Signed)
Pt presetns to ed with c/o left leg swelling. Pt diagnosed with blood cloths in left leg, sts redness and pain is getting worse last few days. Also pt reports blisters on his left thigh.

## 2012-06-13 NOTE — Progress Notes (Signed)
VASCULAR LAB PRELIMINARY  PRELIMINARY  PRELIMINARY  PRELIMINARY  Left lower extremity venous duplex completed.    Preliminary report:  Left:  No evidence of DVT, superficial thrombosis, or Baker's cyst.  Alex Mcintosh, RVT 06/13/2012, 4:14 PM

## 2012-06-13 NOTE — ED Notes (Signed)
Ambulated patient to the restroom and back. Walking to the restroom patient was fine. O2 sat did drop to 69RA HR was 169 MD aware. Upon returning back to patients room emt hooked patient back up to the monitor O2 sat was 96 2L and HR was 113.

## 2012-06-13 NOTE — ED Notes (Signed)
Patient given urinal and advised need sample.  

## 2012-06-13 NOTE — ED Provider Notes (Signed)
History  This chart was scribed for Loren Racer, MD by Shari Heritage, ED Scribe. The patient was seen in room WA24/WA24. Patient's care was started at 1519.  CSN: [id number]  Arrival date & time 06/13/12  1435   First MD Initiated Contact with Patient 06/13/12 1519      Chief Complaint  Patient presents with  . Leg Swelling    Patient is a 62 y.o. male presenting with rash. The history is provided by the patient. No language interpreter was used.  Rash  This is a new problem. The current episode started more than 1 week ago. The problem has not changed since onset.The problem is associated with an unknown factor. There has been no fever. The rash is present on the left upper leg. The pain is mild. The pain has been constant since onset. Associated symptoms include blisters and pain. Pertinent negatives include no itching. He has tried nothing for the symptoms.     HPI Comments: Alex Mcintosh is a 62 y.o. male who presents to the Emergency Department complaining of increased swelling and erythema to the dorsum of the left foot and calf onset 2 weeks ago. Patient was diagnosed with left lower extremity DVT in September 2013. Patient states that he has improved since he started Coumadin, but swelling began worsening again over the past couple of weeks. Wife states that patient had a fall a couple of weeks ago which caused some increased swelling to the left leg and foot as well. Patient denies fever, chills, nausea and vomiting. Last INR was at 2.3. There have been no recent changes in daily Coumadin dosages.  Patient also reports multiple, erythematous blisters to the left lateral thigh with associated mild to moderate burning sensation onset 1.5 weeks ago. Patient says he has no history of shingles.    Past Medical History  Diagnosis Date  . Hyperlipemia   . Chronic back pain   . Seizures   . GERD (gastroesophageal reflux disease)   . Anxiety     Past Surgical History  Procedure Date   . Back surgery     History reviewed. No pertinent family history.  History  Substance Use Topics  . Smoking status: Never Smoker   . Smokeless tobacco: Never Used  . Alcohol Use: No      Review of Systems  Constitutional: Negative for fever and chills.  Cardiovascular: Positive for leg swelling.  Skin: Positive for rash. Negative for itching.  All other systems reviewed and are negative.    Allergies  Ceftin and Statins  Home Medications   Current Outpatient Rx  Name  Route  Sig  Dispense  Refill  . CARISOPRODOL 350 MG PO TABS   Oral   Take 350 mg by mouth 4 (four) times daily.         Marland Kitchen DIAZEPAM 5 MG PO TABS   Oral   Take 5 mg by mouth 4 (four) times daily. For anxiety         . DULOXETINE HCL 60 MG PO CPEP   Oral   Take 120 mg by mouth daily.         Marland Kitchen ESZOPICLONE 3 MG PO TABS   Oral   Take 3 mg by mouth at bedtime. Take immediately before bedtime         . EZETIMIBE 10 MG PO TABS   Oral   Take 10 mg by mouth daily.         Marland Kitchen GABAPENTIN 600 MG  PO TABS   Oral   Take 600 mg by mouth 2 (two) times daily.          Marland Kitchen OMEPRAZOLE 20 MG PO CPDR   Oral   Take 20 mg by mouth daily.         Marland Kitchen OXYMORPHONE HCL 10 MG PO TABS   Oral   Take 20 mg by mouth every 6 (six) hours as needed. Pain.         Marland Kitchen PROPRANOLOL HCL 20 MG PO TABS   Oral   Take 20 mg by mouth 2 (two) times daily.         Raylene Miyamoto ER PO   Oral   Take 250 mg by mouth 2 (two) times daily.         . TESTOSTERONE 12.5 MG/ACT (1%) TD GEL   Transdermal   Place 1 packet onto the skin daily.          . WARFARIN SODIUM 5 MG PO TABS   Oral   Take 5-7.5 mg by mouth every morning. 1 tablet daily except on Wednesday patient takes 1.5 tablets         . FUROSEMIDE 20 MG PO TABS   Oral   Take 1 tablet (20 mg total) by mouth daily.   30 tablet   0   . ADULT MULTIVITAMIN W/MINERALS CH   Oral   Take 1 tablet by mouth daily.           Triage Vitals: BP 115/67  Pulse  105  Temp 98.2 F (36.8 C) (Oral)  Resp 16  SpO2 89%  Physical Exam  Constitutional: He is oriented to person, place, and time. He appears well-developed and well-nourished. No distress.  HENT:  Head: Normocephalic and atraumatic.  Mouth/Throat: Oropharynx is clear and moist.  Eyes: Conjunctivae normal and EOM are normal. Pupils are equal, round, and reactive to light.  Neck: Normal range of motion. Neck supple.  Cardiovascular: Normal rate, regular rhythm and normal heart sounds.   Pulmonary/Chest: Effort normal and breath sounds normal. No respiratory distress. He has no wheezes. He has no rales.  Abdominal: Soft. Bowel sounds are normal. He exhibits no distension. There is no tenderness. There is no rebound.  Musculoskeletal: Normal range of motion.       Left lower leg: He exhibits swelling.       Left foot: He exhibits swelling.       Swelling to left foot and calf.  Neurological: He is alert and oriented to person, place, and time.  Skin: Skin is warm and dry. Rash noted. Rash is vesicular. There is erythema.       Erythematous, vesicular rash confined to left lateral buttock and quadricep.    ED Course  Procedures (including critical care time) DIAGNOSTIC STUDIES: Oxygen Saturation is 89% on room air, low by my interpretation.    COORDINATION OF CARE: 3:27 PM- Patient informed of current plan for treatment and evaluation and agrees with plan at this time.   6:15 PM- Patient appears comfortable, but drowsy. X-ray showed vascular congestion. Informed patient that swelling is likely due to heart not pumping well. Have given Lasix and will keep patient for a little longer to observe.  8:26 PM- Patient desatted to the 60s and HR elevated to 140s while walking. Have spoken with wife who understands need for patient to be admitted. Have paged hospitalist for consult.  9:02 PM- Spoke with Dr. Eben Burow who was planning to admit patient.  Patient is alert and oriented x3. He understands  the consequences of refusing medical admission including cardio-pulmonary arrest and possible death. He wants to leave against medical advice. He has been encouraged to return for worsening symptoms or upon changing his mind. Will have patient sign an AMA form.   Labs Reviewed  COMPREHENSIVE METABOLIC PANEL - Abnormal; Notable for the following:    CO2 33 (*)     Albumin 3.1 (*)     All other components within normal limits  PROTIME-INR - Abnormal; Notable for the following:    Prothrombin Time 28.4 (*)     INR 2.84 (*)     All other components within normal limits  APTT - Abnormal; Notable for the following:    aPTT 61 (*)     All other components within normal limits  CBC WITH DIFFERENTIAL  URINALYSIS, ROUTINE W REFLEX MICROSCOPIC  PRO B NATRIURETIC PEPTIDE  TROPONIN I    Dg Chest Port 1 View  06/13/2012  *RADIOLOGY REPORT*  Clinical Data: Hypoxia, left leg pain  PORTABLE CHEST - 1 VIEW  Comparison: None.  Findings: Cardiomegaly noted with mild vascular congestion.  Low lung volumes evident but no focal pneumonia, definite edema, effusion or pneumothorax.  Mild curvature of the spine versus scoliosis.  Ectatic thoracic aorta.  IMPRESSION: Cardiomegaly with vascular congestion   Original Report Authenticated By: Judie Petit. Shick, M.D.      1. Swelling of left lower extremity   2. Hypoxia   3. Vesicular rash       Date: 06/13/2012  Rate:97  Rhythm: normal sinus rhythm  QRS Axis: normal  Intervals: normal  ST/T Wave abnormalities: nonspecific T wave changes  Conduction Disutrbances:none  Narrative Interpretation:   Old EKG Reviewed: none available   MDM  I personally performed the services described in this documentation, which was scribed in my presence. The recorded information has been reviewed and is accurate.  Pt competent to make medical decisions. Aware of possible worsening of condition and death as result of leaving AMA. Encouraged to return for worsening symptoms or if he  changes his mind.    Loren Racer, MD 06/13/12 2227

## 2012-09-06 ENCOUNTER — Inpatient Hospital Stay (HOSPITAL_COMMUNITY)
Admission: EM | Admit: 2012-09-06 | Discharge: 2012-09-08 | DRG: 071 | Disposition: A | Discharge status: Home / Self Care | Payer: Medicare Other | Attending: Internal Medicine | Admitting: Internal Medicine

## 2012-09-06 ENCOUNTER — Inpatient Hospital Stay (HOSPITAL_COMMUNITY): Payer: Medicare Other

## 2012-09-06 ENCOUNTER — Emergency Department (HOSPITAL_COMMUNITY): Payer: Medicare Other

## 2012-09-06 ENCOUNTER — Encounter (HOSPITAL_COMMUNITY): Payer: Self-pay | Admitting: Emergency Medicine

## 2012-09-06 DIAGNOSIS — R4182 Altered mental status, unspecified: Secondary | ICD-10-CM

## 2012-09-06 DIAGNOSIS — F419 Anxiety disorder, unspecified: Secondary | ICD-10-CM

## 2012-09-06 DIAGNOSIS — F22 Delusional disorders: Secondary | ICD-10-CM | POA: Diagnosis present

## 2012-09-06 DIAGNOSIS — N390 Urinary tract infection, site not specified: Secondary | ICD-10-CM | POA: Diagnosis present

## 2012-09-06 DIAGNOSIS — E785 Hyperlipidemia, unspecified: Secondary | ICD-10-CM | POA: Diagnosis present

## 2012-09-06 DIAGNOSIS — Z79899 Other long term (current) drug therapy: Secondary | ICD-10-CM

## 2012-09-06 DIAGNOSIS — G8929 Other chronic pain: Secondary | ICD-10-CM | POA: Diagnosis present

## 2012-09-06 DIAGNOSIS — E039 Hypothyroidism, unspecified: Secondary | ICD-10-CM | POA: Diagnosis present

## 2012-09-06 DIAGNOSIS — E860 Dehydration: Secondary | ICD-10-CM

## 2012-09-06 DIAGNOSIS — K219 Gastro-esophageal reflux disease without esophagitis: Secondary | ICD-10-CM | POA: Diagnosis present

## 2012-09-06 DIAGNOSIS — F1120 Opioid dependence, uncomplicated: Secondary | ICD-10-CM | POA: Diagnosis present

## 2012-09-06 DIAGNOSIS — G9340 Encephalopathy, unspecified: Principal | ICD-10-CM | POA: Diagnosis present

## 2012-09-06 DIAGNOSIS — Z7901 Long term (current) use of anticoagulants: Secondary | ICD-10-CM

## 2012-09-06 DIAGNOSIS — F1920 Other psychoactive substance dependence, uncomplicated: Secondary | ICD-10-CM | POA: Diagnosis present

## 2012-09-06 DIAGNOSIS — Z86718 Personal history of other venous thrombosis and embolism: Secondary | ICD-10-CM

## 2012-09-06 DIAGNOSIS — F411 Generalized anxiety disorder: Secondary | ICD-10-CM | POA: Diagnosis present

## 2012-09-06 DIAGNOSIS — F339 Major depressive disorder, recurrent, unspecified: Secondary | ICD-10-CM | POA: Diagnosis present

## 2012-09-06 HISTORY — DX: Depression, unspecified: F32.A

## 2012-09-06 HISTORY — DX: Hypothyroidism, unspecified: E03.9

## 2012-09-06 HISTORY — DX: Major depressive disorder, single episode, unspecified: F32.9

## 2012-09-06 HISTORY — DX: Myoclonus: G25.3

## 2012-09-06 HISTORY — DX: Opioid dependence, uncomplicated: F11.20

## 2012-09-06 HISTORY — DX: Encephalopathy, unspecified: G93.40

## 2012-09-06 LAB — COMPREHENSIVE METABOLIC PANEL
Albumin: 3.3 g/dL — ABNORMAL LOW (ref 3.5–5.2)
BUN: 10 mg/dL (ref 6–23)
Calcium: 9.4 mg/dL (ref 8.4–10.5)
Chloride: 100 mEq/L (ref 96–112)
Creatinine, Ser: 1.04 mg/dL (ref 0.50–1.35)
GFR calc non Af Amer: 77 mL/min — ABNORMAL LOW (ref >90)
Total Bilirubin: 0.9 mg/dL (ref 0.3–1.2)

## 2012-09-06 LAB — URINALYSIS, ROUTINE W REFLEX MICROSCOPIC
Glucose, UA: NEGATIVE mg/dL
Hgb urine dipstick: NEGATIVE
Ketones, ur: 15 mg/dL — AB
Protein, ur: 30 mg/dL — AB
pH: 5.5 (ref 5.0–8.0)

## 2012-09-06 LAB — RAPID URINE DRUG SCREEN, HOSP PERFORMED
Amphetamines: NOT DETECTED
Benzodiazepines: POSITIVE — AB
Benzodiazepines: POSITIVE — AB
Cocaine: NOT DETECTED
Opiates: POSITIVE — AB
Opiates: NOT DETECTED
Tetrahydrocannabinol: NOT DETECTED

## 2012-09-06 LAB — CBC WITH DIFFERENTIAL/PLATELET
Basophils Relative: 0 % (ref 0–1)
Eosinophils Relative: 1 % (ref 0–5)
HCT: 42.9 % (ref 39.0–52.0)
Hemoglobin: 14.7 g/dL (ref 13.0–17.0)
MCH: 29 pg (ref 26.0–34.0)
MCHC: 34.3 g/dL (ref 30.0–36.0)
MCV: 84.6 fL (ref 78.0–100.0)
Monocytes Absolute: 1.1 10*3/uL — ABNORMAL HIGH (ref 0.1–1.0)
Monocytes Relative: 11 % (ref 3–12)
Neutro Abs: 5.7 10*3/uL (ref 1.7–7.7)
RDW: 13.9 % (ref 11.5–15.5)

## 2012-09-06 LAB — URINE MICROSCOPIC-ADD ON

## 2012-09-06 LAB — AMMONIA: Ammonia: 19 umol/L (ref 11–60)

## 2012-09-06 LAB — RPR: RPR Ser Ql: NONREACTIVE

## 2012-09-06 LAB — PROTIME-INR: Prothrombin Time: 20 seconds — ABNORMAL HIGH (ref 11.6–15.2)

## 2012-09-06 MED ORDER — CIPROFLOXACIN IN D5W 400 MG/200ML IV SOLN
400.0000 mg | Freq: Two times a day (BID) | INTRAVENOUS | Status: DC
  Administered 2012-09-06 – 2012-09-07 (×4): 400 mg via INTRAVENOUS
  Filled 2012-09-06 (×6): qty 200

## 2012-09-06 MED ORDER — PROPRANOLOL HCL 20 MG PO TABS
20.0000 mg | ORAL_TABLET | Freq: Two times a day (BID) | ORAL | Status: DC
  Administered 2012-09-06 – 2012-09-08 (×5): 20 mg via ORAL
  Filled 2012-09-06 (×6): qty 1

## 2012-09-06 MED ORDER — EZETIMIBE 10 MG PO TABS
10.0000 mg | ORAL_TABLET | Freq: Every day | ORAL | Status: DC
  Administered 2012-09-06 – 2012-09-08 (×3): 10 mg via ORAL
  Filled 2012-09-06 (×3): qty 1

## 2012-09-06 MED ORDER — SODIUM CHLORIDE 0.9 % IV SOLN: INTRAVENOUS | Status: DC

## 2012-09-06 MED ORDER — MORPHINE SULFATE 30 MG PO TABS: 45.0000 mg | ORAL_TABLET | Freq: Four times a day (QID) | ORAL | Status: DC | PRN

## 2012-09-06 MED ORDER — PANTOPRAZOLE SODIUM 40 MG PO TBEC
40.0000 mg | DELAYED_RELEASE_TABLET | Freq: Every day | ORAL | Status: DC
  Administered 2012-09-06 – 2012-09-08 (×3): 40 mg via ORAL
  Filled 2012-09-06 (×3): qty 1

## 2012-09-06 MED ORDER — ZOLPIDEM TARTRATE 5 MG PO TABS: 5.0000 mg | ORAL_TABLET | Freq: Every evening | ORAL | Status: DC | PRN

## 2012-09-06 MED ORDER — TAPENTADOL HCL ER 250 MG PO TB12: 1.0000 | ORAL_TABLET | Freq: Two times a day (BID) | ORAL | Status: DC

## 2012-09-06 MED ORDER — ONDANSETRON HCL 4 MG/2ML IJ SOLN: 4.0000 mg | Freq: Three times a day (TID) | INTRAMUSCULAR | Status: DC | PRN

## 2012-09-06 MED ORDER — DIAZEPAM 5 MG PO TABS
5.0000 mg | ORAL_TABLET | Freq: Four times a day (QID) | ORAL | Status: DC
  Administered 2012-09-06 – 2012-09-07 (×5): 5 mg via ORAL
  Filled 2012-09-06 (×6): qty 1

## 2012-09-06 MED ORDER — ACETAMINOPHEN 325 MG PO TABS: 650.0000 mg | ORAL_TABLET | Freq: Four times a day (QID) | ORAL | Status: DC | PRN

## 2012-09-06 MED ORDER — TAMSULOSIN HCL 0.4 MG PO CAPS
0.4000 mg | ORAL_CAPSULE | Freq: Every day | ORAL | Status: DC
  Administered 2012-09-06 – 2012-09-08 (×3): 0.4 mg via ORAL
  Filled 2012-09-06 (×3): qty 1

## 2012-09-06 MED ORDER — TEMAZEPAM 15 MG PO CAPS
15.0000 mg | ORAL_CAPSULE | Freq: Every day | ORAL | Status: DC
  Administered 2012-09-06 – 2012-09-07 (×2): 15 mg via ORAL
  Filled 2012-09-06 (×2): qty 1

## 2012-09-06 MED ORDER — ONDANSETRON HCL 4 MG/2ML IJ SOLN: 4.0000 mg | Freq: Four times a day (QID) | INTRAMUSCULAR | Status: DC | PRN

## 2012-09-06 MED ORDER — WARFARIN SODIUM 6 MG PO TABS
6.0000 mg | ORAL_TABLET | Freq: Once | ORAL | Status: AC
  Administered 2012-09-06: 6 mg via ORAL
  Filled 2012-09-06: qty 1

## 2012-09-06 MED ORDER — SODIUM CHLORIDE 0.9 % IV SOLN: 1.5000 g | Freq: Once | INTRAVENOUS | Status: DC

## 2012-09-06 MED ORDER — SODIUM CHLORIDE 0.9 % IV BOLUS (SEPSIS)
500.0000 mL | Freq: Once | INTRAVENOUS | Status: AC
  Administered 2012-09-06: 500 mL via INTRAVENOUS

## 2012-09-06 MED ORDER — SODIUM CHLORIDE 0.9 % IJ SOLN
3.0000 mL | Freq: Two times a day (BID) | INTRAMUSCULAR | Status: DC
  Administered 2012-09-06 – 2012-09-08 (×3): 3 mL via INTRAVENOUS

## 2012-09-06 MED ORDER — WARFARIN - PHARMACIST DOSING INPATIENT: Freq: Every day | Status: DC

## 2012-09-06 MED ORDER — SODIUM CHLORIDE 0.9 % IV SOLN
INTRAVENOUS | Status: AC
  Administered 2012-09-06: 07:00:00 via INTRAVENOUS

## 2012-09-06 MED ORDER — DULOXETINE HCL 60 MG PO CPEP
120.0000 mg | ORAL_CAPSULE | Freq: Every day | ORAL | Status: DC
  Administered 2012-09-06 – 2012-09-08 (×3): 120 mg via ORAL
  Filled 2012-09-06 (×3): qty 2

## 2012-09-06 MED ORDER — HYDROMORPHONE HCL PF 1 MG/ML IJ SOLN: 1.0000 mg | INTRAMUSCULAR | Status: DC | PRN

## 2012-09-06 MED ORDER — OXYMORPHONE HCL 10 MG PO TABS: 20.0000 mg | ORAL_TABLET | Freq: Four times a day (QID) | ORAL | Status: DC | PRN

## 2012-09-06 MED ORDER — CARISOPRODOL 350 MG PO TABS
350.0000 mg | ORAL_TABLET | Freq: Four times a day (QID) | ORAL | Status: DC
  Administered 2012-09-06 – 2012-09-08 (×9): 350 mg via ORAL
  Filled 2012-09-06 (×9): qty 1

## 2012-09-06 MED ORDER — ACETAMINOPHEN 650 MG RE SUPP: 650.0000 mg | Freq: Four times a day (QID) | RECTAL | Status: DC | PRN

## 2012-09-06 MED ORDER — SODIUM CHLORIDE 0.9 % IV SOLN
3.0000 g | Freq: Once | INTRAVENOUS | Status: AC
  Administered 2012-09-06: 3 g via INTRAVENOUS
  Filled 2012-09-06: qty 3

## 2012-09-06 MED ORDER — ONDANSETRON HCL 4 MG PO TABS: 4.0000 mg | ORAL_TABLET | Freq: Four times a day (QID) | ORAL | Status: DC | PRN

## 2012-09-06 NOTE — ED Notes (Signed)
Pt's 02 dropped in upper 80s on RA.  Placed pt on 2L [place], 02 up to 97%.

## 2012-09-06 NOTE — Progress Notes (Addendum)
ANTICOAGULATION CONSULT NOTE - Initial Consult  Pharmacy Consult for Coumadin Indication: h/o DVT  Allergies  Allergen Reactions  . Ceftin (Cefuroxime Axetil) Hives  . Statins     Messes with liver enzymes    Vital Signs: Temp: 98 F (36.7 C) (04/30 0500) Temp src: Oral (04/30 0128) BP: 112/81 mmHg (04/30 0530) Pulse Rate: 92 (04/30 0530)  Labs:  Recent Labs  09/06/12 0213  HGB 14.7  HCT 42.9  PLT 207  APTT 21*  LABPROT 20.0*  INR 1.77*  CREATININE 1.04    Medical History: Past Medical History  Diagnosis Date  . Hyperlipemia   . Chronic back pain   . Seizures   . GERD (gastroesophageal reflux disease)   . Anxiety   . Chronic narcotic dependence     Assessment: 62yo male with AMS to be admitted for possible infection to continue Coumadin for h/o DVT; admitted with subtherapeutic INR, unknown when last dose was taken since pt was missing x3d and now unable to say what occurred during those days.  Goal of Therapy:  INR 2-3   Plan:  Will give boosted dose of Coumadin 6mg  x1 today and monitor INR for dose adjustments.  Vernard Gambles, PharmD, BCPS  09/06/2012,6:28 AM  Addum:  Cipro per Pharmacy r/o UTI.  SrCr 1.04, CrCl ~90 ml/min. Cipro 400 mg IV q12 hours. F/u cultures and clinical progress. Monitor for drug interaction with coumadin.

## 2012-09-06 NOTE — ED Notes (Signed)
Pt transported to EEG and will be taken to 2000 from EEG

## 2012-09-06 NOTE — Progress Notes (Signed)
EEG completed.
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
## 2012-09-06 NOTE — Progress Notes (Signed)
Pt complained of having trouble voiding. Bladder scan revealed 439 cc of urine. Md order to in and out cath. In and out cath result 800 cc. Will continue to monitor intake and output. Dion Saucier
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
## 2012-09-06 NOTE — ED Notes (Addendum)
SPOUSE CONCERNED ABOUT PT'S CONFUSION FOR THE LAST SEVERAL DAYS , STATES CURRENTLY TAKING SEVERAL PAIN MEDICATIONS FOR CHRONIC LOW BACK /HIP PAIN,  SUSPECTS MULTIPLE PAIN MEDICATIONS MAKES HIM CONFUSED/DISORIENTED .

## 2012-09-06 NOTE — ED Notes (Signed)
Patient transported to CT 

## 2012-09-06 NOTE — ED Notes (Signed)
Pt in MRI.

## 2012-09-06 NOTE — ED Notes (Signed)
Alex Mcintosh--WIFE [phone]

## 2012-09-06 NOTE — Progress Notes (Signed)
Patient admitted early this AM by Dr. Toniann Fail- please see H&P.  EEG done  Marlin Canary DO

## 2012-09-06 NOTE — Progress Notes (Signed)
Utilization Review Completed.Alex Mcintosh T4/30/2014  

## 2012-09-07 DIAGNOSIS — R4182 Altered mental status, unspecified: Secondary | ICD-10-CM

## 2012-09-07 DIAGNOSIS — N390 Urinary tract infection, site not specified: Secondary | ICD-10-CM

## 2012-09-07 HISTORY — DX: Urinary tract infection, site not specified: N39.0

## 2012-09-07 LAB — URINE CULTURE: Culture: NO GROWTH

## 2012-09-07 LAB — BASIC METABOLIC PANEL
BUN: 7 mg/dL (ref 6–23)
CO2: 29 mEq/L (ref 19–32)
Chloride: 106 mEq/L (ref 96–112)
Creatinine, Ser: 0.75 mg/dL (ref 0.50–1.35)

## 2012-09-07 LAB — CBC
HCT: 39.4 % (ref 39.0–52.0)
MCHC: 33.8 g/dL (ref 30.0–36.0)
MCV: 84.9 fL (ref 78.0–100.0)
RDW: 14 % (ref 11.5–15.5)

## 2012-09-07 MED ORDER — WARFARIN SODIUM 6 MG PO TABS
6.0000 mg | ORAL_TABLET | Freq: Once | ORAL | Status: AC
  Administered 2012-09-07: 6 mg via ORAL
  Filled 2012-09-07: qty 1

## 2012-09-07 MED ORDER — DIAZEPAM 2 MG PO TABS
2.0000 mg | ORAL_TABLET | Freq: Four times a day (QID) | ORAL | Status: DC
  Administered 2012-09-07 – 2012-09-08 (×5): 2 mg via ORAL
  Filled 2012-09-07 (×5): qty 1

## 2012-09-07 MED ORDER — MORPHINE SULFATE 15 MG PO TABS
45.0000 mg | ORAL_TABLET | Freq: Four times a day (QID) | ORAL | Status: DC | PRN
  Administered 2012-09-07 – 2012-09-08 (×3): 45 mg via ORAL
  Filled 2012-09-07: qty 1
  Filled 2012-09-07: qty 2
  Filled 2012-09-07 (×2): qty 3

## 2012-09-07 MED ORDER — POTASSIUM CHLORIDE CRYS ER 20 MEQ PO TBCR
40.0000 meq | EXTENDED_RELEASE_TABLET | Freq: Once | ORAL | Status: AC
  Administered 2012-09-07: 40 meq via ORAL
  Filled 2012-09-07: qty 2

## 2012-09-07 NOTE — Procedures (Signed)
EEG NUMBER:  [id number].  REFERRING PHYSICIAN:  Eduard Clos, MD  INDICATION FOR STUDY:  A 62 year old man with a history of transient confusion of unclear etiology.  Study is being performed to rule out possible new-onset partial seizure disorder.  DESCRIPTION:  This is a routine EEG recording performed during drowsiness and during light sleep.  Predominant activity during drowsiness consisted of low amplitude, diffuse symmetrical delta activity with superimposed 15-20 Hz diffuse beta activity as well as occasional brief runs of 8 Hz alpha activity recorded from the posterior head regions.  During light sleep, symmetrical sleep spindles and occasional arousal responses were recorded, which was symmetrical. Photic stimulation was not performed.  Hyperventilation was not performed.  No epileptiform discharges were recorded.  INTERPRETATION:  This is a normal EEG recording during drowsiness and during light sleep.     Noel Christmas, MD    [id number] D:  09/06/2012 12:44:50  T:  09/07/2012 04:13:01  Job #:  [id number]

## 2012-09-07 NOTE — Progress Notes (Signed)
ANTICOAGULATION CONSULT NOTE - Initial Consult  Pharmacy Consult for Coumadin Indication: h/o DVT  Allergies  Allergen Reactions  . Ceftin (Cefuroxime Axetil) Hives  . Statins     Messes with liver enzymes    Vital Signs: Temp: 98.3 F (36.8 C) (05/01 1300) Temp src: Oral (05/01 1300) BP: 126/72 mmHg (05/01 1300) Pulse Rate: 78 (05/01 1300)  Labs:  Recent Labs  09/06/12 0213 09/07/12 0510  HGB 14.7 13.3  HCT 42.9 39.4  PLT 207 164  APTT 21*  --   LABPROT 20.0* 20.0*  INR 1.77* 1.77*  CREATININE 1.04 0.75    Medical History: Past Medical History  Diagnosis Date  . Hyperlipemia   . Chronic back pain   . Seizures   . GERD (gastroesophageal reflux disease)   . Anxiety   . Chronic narcotic dependence   . Hypothyroidism   . Depression   . Myoclonus     Assessment: 62yo male with AMS to be admitted for possible infection to continue Coumadin for h/o DVT; admitted with subtherapeutic INR, unknown when last dose was taken since pt was missing x3d and now unable to say what occurred during those days.  Today INR = 1.77.  CBC stable. No bleeding noted.  Home dose of coumadin was 5mg  daily except 2.5mg  every Tuesday and Wednesday.   Acute encephalopathy - most likely cause could be drug related and /UTI. On Cipro 400mg  IV q12h for UTI- dose is appropriate for current renal function, SCr stable.CrCl >100 ml/min. Cipro can increase coumadin effect.    Goal of Therapy:  INR 2-3   Plan:  Coumadin 6 mg x1 today INR qAM  Noah Delaine, RPh Clinical Pharmacist Pager: [phone] 09/07/2012, 16:20 PM

## 2012-09-07 NOTE — Progress Notes (Addendum)
[facility] PROGRESS NOTE  Alex Mcintosh ZOX:[id number] DOB: 12-20-1950 DOA: 09/06/2012 PCP: Berenda Morale, MD  Assessment/Plan: Acute encephalopathy - most likely cause could be drug related and /UTI. Patient may have not taken his medications as indicated. A urine drug screen is pending.  MRI of the brain neg, ammonia levels ok, TSH, RPR, B12,EEG are all ok. Patient does have a UA which shows possibility of UTI and has been started on antibiotics. Which can also cause possible confusion. Follow urine cultures.    Chronic pain - do not carry patient's home meds- they are in [place] and family unable to bring in  Paranoia- ask psych to see to maximize psych medications   History of DVT - Coumadin per pharmacy.   Hyperlipidemia - continue home medications.   History of myoclonus - on Valium- wean down as tolerated. Presently has no obvious myoclonic movements on exam  Urinary retention- says he has a "shy bladder-" happens to him at the hospital- once home he will be able to urinate  Code Status: full Family Communication: wife at bedside Disposition Plan: ?   Consultants:  psych  Procedures:  none  Antibiotics:  cipro  HPI/Subjective: Feels like he is back to baseline, wants to go back home  Objective: Filed Vitals:   09/06/12 1700 09/06/12 2120 09/07/12 0456 09/07/12 0958  BP:  122/75 95/61 110/68  Pulse:  96 74 79  Temp:  97.1 F (36.2 C) 98 F (36.7 C)   TempSrc:  Oral Oral   Resp:  20 18   Height: 5\' 10"  (1.778 m)     Weight: 131.543 kg (290 lb)     SpO2:  98% 92%     Intake/Output Summary (Last 24 hours) at 09/07/12 1141 Last data filed at 09/06/12 1834  Gross per 24 hour  Intake    120 ml  Output    800 ml  Net   -680 ml   Filed Weights   09/06/12 1700  Weight: 131.543 kg (290 lb)    Exam:  General: Well-developed well-nourished.  Cardiovascular: S1-S2 heard.  Respiratory: No rhonchi no crepitations.  Abdomen: Soft nontender bowel  sounds present.  Skin: No rash.  Musculoskeletal: No edema.  Psychiatric: Alert awake oriented to person. Place, time Neurologic: Follows commands moves all extremities.    Data Reviewed: Basic Metabolic Panel:  Recent Labs Lab 09/06/12 0213 09/07/12 0510  NA 139 144  K 3.5 3.4*  CL 100 106  CO2 31 29  GLUCOSE 109* 116*  BUN 10 7  CREATININE 1.04 0.75  CALCIUM 9.4 9.0   Liver Function Tests:  Recent Labs Lab 09/06/12 0213  AST 28  ALT 13  ALKPHOS 108  BILITOT 0.9  PROT 7.1  ALBUMIN 3.3*   No results found for this basename: LIPASE, AMYLASE,  in the last 168 hours  Recent Labs Lab 09/06/12 0830  AMMONIA 19   CBC:  Recent Labs Lab 09/06/12 0213 09/07/12 0510  WBC 9.5 7.8  NEUTROABS 5.7  --   HGB 14.7 13.3  HCT 42.9 39.4  MCV 84.6 84.9  PLT 207 164   Cardiac Enzymes: No results found for this basename: CKTOTAL, CKMB, CKMBINDEX, TROPONINI,  in the last 168 hours BNP (last 3 results)  Recent Labs  06/13/12 1550  PROBNP 120.4   CBG: No results found for this basename: GLUCAP,  in the last 168 hours  Recent Results (from the past 240 hour(s))  CULTURE, BLOOD (ROUTINE X 2)  Status: None   Collection Time    09/06/12  5:10 AM      Result Value Range Status   Specimen Description BLOOD LEFT ANTECUBITAL   Final   Special Requests BOTTLES DRAWN AEROBIC AND ANAEROBIC 10CC   Final   Culture  Setup Time 09/06/2012 15:03   Final   Culture     Final   Value:        BLOOD CULTURE RECEIVED NO GROWTH TO DATE CULTURE WILL BE HELD FOR 5 DAYS BEFORE ISSUING A FINAL NEGATIVE REPORT   Report Status PENDING   Incomplete  CULTURE, BLOOD (ROUTINE X 2)     Status: None   Collection Time    09/06/12  5:20 AM      Result Value Range Status   Specimen Description BLOOD HAND LEFT   Final   Special Requests BOTTLES DRAWN AEROBIC ONLY 10CC   Final   Culture  Setup Time 09/06/2012 15:03   Final   Culture     Final   Value:        BLOOD CULTURE RECEIVED NO GROWTH  TO DATE CULTURE WILL BE HELD FOR 5 DAYS BEFORE ISSUING A FINAL NEGATIVE REPORT   Report Status PENDING   Incomplete     Studies: Dg Chest 2 View  09/06/2012  *RADIOLOGY REPORT*  Clinical Data: .  CHEST - 2 VIEW  Comparison: 06/13/2012  Findings: Interval improvement of vascular congestion since previous study.  Azygos lobe. The heart size and pulmonary vascularity are normal. The lungs appear clear and expanded without focal air space disease or consolidation. No blunting of the costophrenic angles.  No pneumothorax.  Mediastinal contours appear intact.  IMPRESSION: No evidence of active pulmonary disease.  Interval improvement of congestive changes since previous study.   Original Report Authenticated By: Burman Nieves, M.D.    Ct Head Wo Contrast  09/06/2012  *RADIOLOGY REPORT*  Clinical Data: Altered mental status for several days.  Confusion.  CT HEAD WITHOUT CONTRAST  Technique:  Contiguous axial images were obtained from the base of the skull through the vertex without contrast.  Comparison: None.  Findings: The ventricles and sulci are symmetrical without significant effacement, displacement, or dilatation. No mass effect or midline shift. No abnormal extra-axial fluid collections. The grey-white matter junction is distinct. Basal cisterns are not effaced. No acute intracranial hemorrhage. No depressed skull fractures.  No depressed skull fractures.  Mucosal thickening of the paranasal sinuses.  No acute air-fluid levels.  Mastoid air cells are not opacified.  IMPRESSION: No acute intracranial abnormalities.   Original Report Authenticated By: Burman Nieves, M.D.    Mr Brain Wo Contrast  09/06/2012  *RADIOLOGY REPORT*  Clinical Data: 62 year old male found with altered mental status. Acute encephalopathy.  MRI HEAD WITHOUT CONTRAST  Technique:  Multiplanar, multiecho pulse sequences of the brain and surrounding structures were obtained according to standard protocol without intravenous  contrast.  Comparison: Head CT without contrast 09/06/2012.  Findings: Study is intermittently degraded by motion artifact despite repeated imaging attempts.  Suggestion of a small cortical or subcortical white matter area of restricted diffusion in the left parietal lobe (series 4 image 21).  No definite T2 or FLAIR hyperintensity is associated.  No other convincing area of restricted diffusion in the brain  Major intracranial vascular flow voids are preserved. No midline shift, ventriculomegaly, mass effect, evidence of mass lesion, extra-axial collection or acute intracranial hemorrhage. Cervicomedullary junction and pituitary are within normal limits. Negative visualized cervical spine.  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  No other areas of cortical encephalomalacia identified.  Mild scattered mostly subcortical white matter T2 and FLAIR hyperintensity.  Deep gray matter nuclei and brain stem within normal limits.  Several small chronic lacunar infarcts, or less likely dilated perivascular spaces, are noted in the left cerebellum.  The  Hyperostosis frontalis.  Normal bone marrow signal. Visualized orbit soft tissues are within normal limits.  Small fluid level in the right maxillary sinus plus right side paranasal sinus mucosal thickening.  Trace left mastoid fluid.  Negative visualized nasopharynx.  Negative scalp soft tissues.  IMPRESSION: 1.  Subtle evidence of a very small acute cortical or subcortical white matter infarct in the left parietal lobe (see series 4 image 21). A similar size small chronic cortical infarct is identified in the right motor strip and  are several chronic lacunar infarcts suspected in the left cerebellum. 2.  No other acute abnormality of the brain is evident. 3.  Mild right paranasal inflammatory changes which may reflect acute sinusitis.   Original Report Authenticated By: Erskine Speed, M.D.     Scheduled Meds: .  carisoprodol  350 mg Oral QID  . ciprofloxacin  400 mg Intravenous Q12H  . diazepam  5 mg Oral QID  . DULoxetine  120 mg Oral Daily  . ezetimibe  10 mg Oral Daily  . pantoprazole  40 mg Oral Daily  . propranolol  20 mg Oral BID  . sodium chloride  3 mL Intravenous Q12H  . tamsulosin  0.4 mg Oral QPC supper  . temazepam  15 mg Oral QHS  . Warfarin - Pharmacist Dosing Inpatient   Does not apply q1800   Continuous Infusions:   Principal Problem:   Acute encephalopathy Active Problems:   Hyperlipidemia   History of DVT of lower extremity   Chronic pain   UTI (lower urinary tract infection)    Time spent: 35    [facility], Makai Dumond  [facility] Pager [phone]. If 7PM-7AM, please contact night-coverage at [email or web address], password [facility] 09/07/2012, 11:41 AM  LOS: 1 day

## 2012-09-08 DIAGNOSIS — E860 Dehydration: Secondary | ICD-10-CM

## 2012-09-08 DIAGNOSIS — F411 Generalized anxiety disorder: Secondary | ICD-10-CM

## 2012-09-08 DIAGNOSIS — F339 Major depressive disorder, recurrent, unspecified: Secondary | ICD-10-CM

## 2012-09-08 MED ORDER — WARFARIN SODIUM 1 MG PO TABS
1.0000 mg | ORAL_TABLET | Freq: Once | ORAL | Status: AC
  Administered 2012-09-08: 1 mg via ORAL
  Filled 2012-09-08: qty 1

## 2012-09-08 MED ORDER — TAMSULOSIN HCL 0.4 MG PO CAPS: 0.4000 mg | ORAL_CAPSULE | Freq: Every day | ORAL | Status: DC

## 2012-09-08 MED ORDER — CIPROFLOXACIN HCL 500 MG PO TABS
500.0000 mg | ORAL_TABLET | Freq: Two times a day (BID) | ORAL | Status: DC
  Administered 2012-09-08: 500 mg via ORAL
  Filled 2012-09-08 (×3): qty 1

## 2012-09-08 MED ORDER — DIAZEPAM 2 MG PO TABS: 2.0000 mg | ORAL_TABLET | Freq: Four times a day (QID) | ORAL | Status: DC

## 2012-09-08 MED ORDER — MORPHINE SULFATE 15 MG PO TABS: 45.0000 mg | ORAL_TABLET | Freq: Four times a day (QID) | ORAL | Status: DC | PRN

## 2012-09-08 NOTE — Discharge Summary (Signed)
Physician Discharge Summary  Alex Mcintosh:[id number] DOB: [birth year] DOA: 09/06/2012  PCP: Berenda Morale, MD  Admit date: 09/06/2012 Discharge date: 09/08/2012  Time spent: 35 minutes  Recommendations for Outpatient Follow-up:  1. pcp for referral to Psych dr 2. Pain management for weaning off of pain medications 3. PT/INR on Monday 4. Referral to urology    Discharge Diagnoses:  Principal Problem:   Acute encephalopathy Active Problems:   Hyperlipidemia   History of DVT of lower extremity   Chronic pain   UTI (lower urinary tract infection)   Discharge Condition: improved  Diet recommendation: cardiac  Filed Weights   09/06/12 1700  Weight: 131.543 kg (290 lb)    History of present illness:  Alex Mcintosh is a 62 y.o. male with history of chronic pain, hyperlipidemia, myoclonus, and DVT on Coumadin was found to be confused and in a car on a roadside in [place]. Patient had gone to visit his daughter in [place] had stayed over there in a hotel for one week. And last Sunday 4 days ago he had checked out and was on his way back home to [place]. On Monday morning he had called his wife from another on the way. But since then last evening that Tuesday evening there was no contact with him. Patient was found on the road side in his car confused and Police contacted his family and his family went and picked him up and brought him straight to the ER at [facility]. He was found to be initially in confused state but at this time is become more alert awake oriented aspirin his wife with whom I had spoken. Patient at this time denies any chest pain shortness of breath headache fever chills nausea vomiting abdominal pain and diarrhea. Patient takes a lot of pain medications. He does not recollect that would happen last 2 days. CT head was negative for anything acute. UA shows possibility of UTI.   Hospital Course:  Acute encephalopathy - most likely cause could be drug  related and /UTI. Patient may have not taken his medications as indicated. MRI of the brain showed a possible small CVA but not consistent with presentation- life style modifications were discussed, ammonia levels ok, TSH, RPR, B12,EEG are all ok. Patient does have a UA which shows possibility of UTI and has been started on antibiotics. Which can also cause possible confusion. Follow urine cultures were negative.  Suspect patient was abusing drugs-    Chronic pain - do not carry patient's home meds- they are in [place] and family unable to bring in- will give a few days of pain meds at d/c  Paranoia- ask psych to see to maximize psych medications - resolved and no intervention by [facility]  History of DVT - Coumadin per pharmacy.   Hyperlipidemia - continue home medications.   History of myoclonus - on Valium- wean down as tolerated. Presently has no obvious myoclonic movements on exam   Urinary retention- says he has a "shy bladder-" happens to him at the hospital- once home he will be able to urinate- urinating upon d/c- will need outpatient referral to urology   Procedures: None  Consultations:  psych  Discharge Exam: Filed Vitals:   09/07/12 2050 09/07/12 2052 09/08/12 0431 09/08/12 1345  BP: 108/70 108/70 105/66 123/71  Pulse:  64 77 73  Temp:  96.8 F (36 C) 98.2 F (36.8 C) 97.1 F (36.2 C)  TempSrc:  Oral Oral Oral  Resp:  20 18 17  Height:      Weight:      SpO2:  98% 97% 91%    General: A+Ox3, NAD, no SI/HI Cardiovascular: rrr Respiratory: clear  Discharge Instructions      Discharge Orders   Future Orders Complete By Expires     Diet - low sodium heart healthy  As directed     Discharge instructions  As directed     Comments:      Can take Morphine IR until able to get pain medications from [place] -need to follow up with pain dr ASAP as well as PCP (hold lasix until then) -outpatient referral to urology PT/INR on Monday    Increase activity slowly  As  directed         Medication List    STOP taking these medications       furosemide 20 MG tablet  Commonly known as:  LASIX      TAKE these medications       carisoprodol 350 MG tablet  Commonly known as:  SOMA  Take 350 mg by mouth 4 (four) times daily.     diazepam 2 MG tablet  Commonly known as:  VALIUM  Take 1 tablet (2 mg total) by mouth 4 (four) times daily.     DULoxetine 60 MG capsule  Commonly known as:  CYMBALTA  Take 120 mg by mouth daily.     eszopiclone 3 MG Tabs  Generic drug:  Eszopiclone  Take 3 mg by mouth at bedtime. Take immediately before bedtime     ezetimibe 10 MG tablet  Commonly known as:  ZETIA  Take 10 mg by mouth daily.     morphine 15 MG tablet  Commonly known as:  MSIR  Take 3 tablets (45 mg total) by mouth every 6 (six) hours as needed.     NUCYNTA ER 250 MG Tb12  Generic drug:  Tapentadol HCl  Take 1 tablet by mouth 2 (two) times daily.     omeprazole 20 MG capsule  Commonly known as:  PRILOSEC  Take 20 mg by mouth daily.     oxymorphone 10 MG tablet  Commonly known as:  OPANA  Take 20 mg by mouth every 6 (six) hours as needed. Pain.     propranolol 20 MG tablet  Commonly known as:  INDERAL  Take 20 mg by mouth 2 (two) times daily.     tamsulosin 0.4 MG Caps  Commonly known as:  FLOMAX  Take 1 capsule (0.4 mg total) by mouth daily after supper.     temazepam 15 MG capsule  Commonly known as:  RESTORIL  Take 15 mg by mouth at bedtime.     warfarin 5 MG tablet  Commonly known as:  COUMADIN  Take 2.5-5 mg by mouth every morning. 1 tablet daily except on Tuesday and Wednesday patient takes 1/2 tablet       Allergies  Allergen Reactions  . Ceftin (Cefuroxime Axetil) Hives  . Statins     Messes with liver enzymes      The results of significant diagnostics from this hospitalization (including imaging, microbiology, ancillary and laboratory) are listed below for reference.    Significant Diagnostic Studies: Dg  Chest 2 View  09/06/2012  *RADIOLOGY REPORT*  Clinical Data: .  CHEST - 2 VIEW  Comparison: 06/13/2012  Findings: Interval improvement of vascular congestion since previous study.  Azygos lobe. The heart size and pulmonary vascularity are normal. The lungs appear clear and expanded without focal  air space disease or consolidation. No blunting of the costophrenic angles.  No pneumothorax.  Mediastinal contours appear intact.  IMPRESSION: No evidence of active pulmonary disease.  Interval improvement of congestive changes since previous study.   Original Report Authenticated By: Burman Nieves, M.D.    Ct Head Wo Contrast  09/06/2012  *RADIOLOGY REPORT*  Clinical Data: Altered mental status for several days.  Confusion.  CT HEAD WITHOUT CONTRAST  Technique:  Contiguous axial images were obtained from the base of the skull through the vertex without contrast.  Comparison: None.  Findings: The ventricles and sulci are symmetrical without significant effacement, displacement, or dilatation. No mass effect or midline shift. No abnormal extra-axial fluid collections. The grey-white matter junction is distinct. Basal cisterns are not effaced. No acute intracranial hemorrhage. No depressed skull fractures.  No depressed skull fractures.  Mucosal thickening of the paranasal sinuses.  No acute air-fluid levels.  Mastoid air cells are not opacified.  IMPRESSION: No acute intracranial abnormalities.   Original Report Authenticated By: Burman Nieves, M.D.    Mr Brain Wo Contrast  09/06/2012  *RADIOLOGY REPORT*  Clinical Data: 62 year old male found with altered mental status. Acute encephalopathy.  MRI HEAD WITHOUT CONTRAST  Technique:  Multiplanar, multiecho pulse sequences of the brain and surrounding structures were obtained according to standard protocol without intravenous contrast.  Comparison: Head CT without contrast 09/06/2012.  Findings: Study is intermittently degraded by motion artifact despite repeated  imaging attempts.  Suggestion of a small cortical or subcortical white matter area of restricted diffusion in the left parietal lobe (series 4 image 21).  No definite T2 or FLAIR hyperintensity is associated.  No other convincing area of restricted diffusion in the brain  Major intracranial vascular flow voids are preserved. No midline shift, ventriculomegaly, mass effect, evidence of mass lesion, extra-axial collection or acute intracranial hemorrhage. Cervicomedullary junction and pituitary are within normal limits. Negative visualized cervical spine.  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  No other areas of cortical encephalomalacia identified.  Mild scattered mostly subcortical white matter T2 and FLAIR hyperintensity.  Deep gray matter nuclei and brain stem within normal limits.  Several small chronic lacunar infarcts, or less likely dilated perivascular spaces, are noted in the left cerebellum.  The  Hyperostosis frontalis.  Normal bone marrow signal. Visualized orbit soft tissues are within normal limits.  Small fluid level in the right maxillary sinus plus right side paranasal sinus mucosal thickening.  Trace left mastoid fluid.  Negative visualized nasopharynx.  Negative scalp soft tissues.  IMPRESSION: 1.  Subtle evidence of a very small acute cortical or subcortical white matter infarct in the left parietal lobe (see series 4 image 21). A similar size small chronic cortical infarct is identified in the right motor strip and  are several chronic lacunar infarcts suspected in the left cerebellum. 2.  No other acute abnormality of the brain is evident. 3.  Mild right paranasal inflammatory changes which may reflect acute sinusitis.   Original Report Authenticated By: Erskine Speed, M.D.     Microbiology: Recent Results (from the past 240 hour(s))  URINE CULTURE     Status: None   Collection Time    09/06/12  3:35 AM      Result Value Range  Status   Specimen Description URINE, CLEAN CATCH   Final   Special Requests NONE   Final   Culture  Setup Time 09/06/2012 05:01   Final  Colony Count NO GROWTH   Final   Culture NO GROWTH   Final   Report Status 09/07/2012 FINAL   Final  CULTURE, BLOOD (ROUTINE X 2)     Status: None   Collection Time    09/06/12  5:10 AM      Result Value Range Status   Specimen Description BLOOD LEFT ANTECUBITAL   Final   Special Requests BOTTLES DRAWN AEROBIC AND ANAEROBIC 10CC   Final   Culture  Setup Time 09/06/2012 15:03   Final   Culture     Final   Value:        BLOOD CULTURE RECEIVED NO GROWTH TO DATE CULTURE WILL BE HELD FOR 5 DAYS BEFORE ISSUING A FINAL NEGATIVE REPORT   Report Status PENDING   Incomplete  CULTURE, BLOOD (ROUTINE X 2)     Status: None   Collection Time    09/06/12  5:20 AM      Result Value Range Status   Specimen Description BLOOD HAND LEFT   Final   Special Requests BOTTLES DRAWN AEROBIC ONLY 10CC   Final   Culture  Setup Time 09/06/2012 15:03   Final   Culture     Final   Value:        BLOOD CULTURE RECEIVED NO GROWTH TO DATE CULTURE WILL BE HELD FOR 5 DAYS BEFORE ISSUING A FINAL NEGATIVE REPORT   Report Status PENDING   Incomplete     Labs: Basic Metabolic Panel:  Recent Labs Lab 09/06/12 0213 09/07/12 0510  NA 139 144  K 3.5 3.4*  CL 100 106  CO2 31 29  GLUCOSE 109* 116*  BUN 10 7  CREATININE 1.04 0.75  CALCIUM 9.4 9.0   Liver Function Tests:  Recent Labs Lab 09/06/12 0213  AST 28  ALT 13  ALKPHOS 108  BILITOT 0.9  PROT 7.1  ALBUMIN 3.3*   No results found for this basename: LIPASE, AMYLASE,  in the last 168 hours  Recent Labs Lab 09/06/12 0830  AMMONIA 19   CBC:  Recent Labs Lab 09/06/12 0213 09/07/12 0510  WBC 9.5 7.8  NEUTROABS 5.7  --   HGB 14.7 13.3  HCT 42.9 39.4  MCV 84.6 84.9  PLT 207 164   Cardiac Enzymes: No results found for this basename: CKTOTAL, CKMB, CKMBINDEX, TROPONINI,  in the last 168 hours BNP: BNP  (last 3 results)  Recent Labs  06/13/12 1550  PROBNP 120.4   CBG: No results found for this basename: GLUCAP,  in the last 168 hours     Signed:  Marlin Canary  [facility] 09/08/2012, 5:35 PM

## 2012-09-08 NOTE — Progress Notes (Signed)
DC instructions given to pt at this time.  Pt verbalized understanding of all instructions.  Instructed pt to remain in hospital until psych doctor sees him just in case psych doctor decides to keep him.  Otherwise pt shows no s/s of any acute distress.  Acting appropriately.

## 2012-09-08 NOTE — Progress Notes (Signed)
Psych MD never came to see patient.  Notified Dr. Benjamine Mola.  Pt to be dc'd at this time.  Pt still denies any suicidal ideation, homicidal ideation, and no delusions noted.  No s/s of any acute distress noted.

## 2012-09-08 NOTE — Consult Note (Signed)
Reason for Consult: AMS and confusion Referring Physician: Dr. Waymond Cera is an 62 y.o. male.  HPI: Patient was seen and chart reviewed. Patient has been suffering with a chronic major depressive disorder and anxiety. Patient has been seeing the outside psychiatric provider who has been providing his medication Cymbalta and Restoril. Patient reportedly has no side effect of the medication and has been thinking today. Patient reported his wife is going to provide transportation to home. Patient denied current symptoms of depression and anxiety.  Medical: KATIE MOCH is a 62 y.o. male with history of chronic pain, hyperlipidemia, myoclonus, and DVT on Coumadin was found to be confused and in a car on a roadside in [place]. Patient had gone to visit his daughter in [place] had stayed over there in a hotel for one week. And last Sunday 4 days ago he had checked out and was on his way back home to [place]. On Monday morning he had called his wife from another on the way. But since then last evening that Tuesday evening there was no contact with him. Patient was found on the road side in his car confused and Police contacted his family and his family went and picked him up and brought him straight to the ER at [facility].   Mental Status Examination: Patient appeared as per his stated age, unshaven white beard, wearing eyeglasses and has a special boots and fairly groomed, and maintaining good eye contact. Patient has good mood and his affect was appropriate. He has normal rate, rhythm, and volume of speech. His thought process is linear and goal directed. Patient has denied suicidal, homicidal ideations, intentions or plans. Patient has no evidence of auditory or visual hallucinations, delusions, and paranoia. Patient has cognitive deficits and there could not recall his medication names but is able to give the details about the his outpatient provider and family without difficulties.  deficits Patient has fair insight judgment and impulse control.  Past Medical History  Diagnosis Date  . Hyperlipemia   . Chronic back pain   . Seizures   . GERD (gastroesophageal reflux disease)   . Anxiety   . Chronic narcotic dependence   . Hypothyroidism   . Depression   . Myoclonus     Past Surgical History  Procedure Laterality Date  . Back surgery    . Hip surgery    . Cervical fusion    . Knee arthroscopy      Family History  Problem Relation Age of Onset  . CAD Father     Social History:  reports that he has never smoked. He has never used smokeless tobacco. He reports that he does not drink alcohol or use illicit drugs.  Allergies:  Allergies  Allergen Reactions  . Ceftin (Cefuroxime Axetil) Hives  . Statins     Messes with liver enzymes    Medications: I have reviewed the patient's current medications.  Results for orders placed during the hospital encounter of 09/06/12 (from the past 48 hour(s))  URINE RAPID DRUG SCREEN (HOSP PERFORMED)     Status: Abnormal   Collection Time    09/06/12  4:37 PM      Result Value Range   Opiates NONE DETECTED  NONE DETECTED   Comment: DELTA CHECK NOTED     REPEATED TO VERIFY   Cocaine NONE DETECTED  NONE DETECTED   Benzodiazepines POSITIVE (*) NONE DETECTED   Amphetamines NONE DETECTED  NONE DETECTED   Tetrahydrocannabinol NONE  DETECTED  NONE DETECTED   Barbiturates NONE DETECTED  NONE DETECTED   Comment:            DRUG SCREEN FOR MEDICAL PURPOSES     ONLY.  IF CONFIRMATION IS NEEDED     FOR ANY PURPOSE, NOTIFY LAB     WITHIN 5 DAYS.                LOWEST DETECTABLE LIMITS     FOR URINE DRUG SCREEN     Drug Class       Cutoff (ng/mL)     Amphetamine      1000     Barbiturate      200     Benzodiazepine   200     Tricyclics       300     Opiates          300     Cocaine          300     THC              50  BASIC METABOLIC PANEL     Status: Abnormal   Collection Time    09/07/12  5:10 AM      Result  Value Range   Sodium 144  135 - 145 mEq/L   Potassium 3.4 (*) 3.5 - 5.1 mEq/L   Chloride 106  96 - 112 mEq/L   CO2 29  19 - 32 mEq/L   Glucose, Bld 116 (*) 70 - 99 mg/dL   BUN 7  6 - 23 mg/dL   Creatinine, Ser 9.60  0.50 - 1.35 mg/dL   Calcium 9.0  8.4 - 45.4 mg/dL   GFR calc non Af Amer >90  >90 mL/min   GFR calc Af Amer >90  >90 mL/min   Comment:            The eGFR has been calculated     using the CKD EPI equation.     This calculation has not been     validated in all clinical     situations.     eGFR's persistently     <90 mL/min signify     possible Chronic Kidney Disease.  CBC     Status: None   Collection Time    09/07/12  5:10 AM      Result Value Range   WBC 7.8  4.0 - 10.5 K/uL   RBC 4.64  4.22 - 5.81 MIL/uL   Hemoglobin 13.3  13.0 - 17.0 g/dL   HCT 09.8  11.9 - 14.7 %   MCV 84.9  78.0 - 100.0 fL   MCH 28.7  26.0 - 34.0 pg   MCHC 33.8  30.0 - 36.0 g/dL   RDW 82.9  56.2 - 13.0 %   Platelets 164  150 - 400 K/uL  PROTIME-INR     Status: Abnormal   Collection Time    09/07/12  5:10 AM      Result Value Range   Prothrombin Time 20.0 (*) 11.6 - 15.2 seconds   INR 1.77 (*) 0.00 - 1.49  PROTIME-INR     Status: Abnormal   Collection Time    09/08/12  4:25 AM      Result Value Range   Prothrombin Time 26.0 (*) 11.6 - 15.2 seconds   INR 2.52 (*) 0.00 - 1.49    No results found.  Positive for anxiety, bad mood, depression, learning difficulty and sleep disturbance  Blood pressure 105/66, pulse 77, temperature 98.2 F (36.8 C), temperature source Oral, resp. rate 18, height 5\' 10"  (1.778 m), weight 290 lb (131.543 kg), SpO2 97.00%.   Assessment/Plan: Major depressive disorder, recurrent Anxiety disorder NOS  Recommendation: Patient does not meet criteria for acute psych hospitalization and will be referred to out patient psychiatric care. He will continue his home medication without changes.  Doyne Micke,JANARDHAHA R. 09/08/2012, 11:27 AM

## 2012-09-08 NOTE — Progress Notes (Signed)
ANTICOAGULATION / ANTIBIOTIC CONSULT NOTE - Follow Up Consult  Pharmacy Consult:  Coumadin / Cipro Indication:  History of DVT / empiric for rule out UTI  Allergies  Allergen Reactions  . Ceftin (Cefuroxime Axetil) Hives  . Statins     Messes with liver enzymes    Patient Measurements: Height: 5\' 10"  (177.8 cm) Weight: 290 lb (131.543 kg) IBW/kg (Calculated) : 73  Vital Signs: Temp: 98.2 F (36.8 C) (05/02 0431) Temp src: Oral (05/02 0431) BP: 105/66 mmHg (05/02 0431) Pulse Rate: 77 (05/02 0431)  Labs:  Recent Labs  09/06/12 0213 09/07/12 0510 09/08/12 0425  HGB 14.7 13.3  --   HCT 42.9 39.4  --   PLT 207 164  --   APTT 21*  --   --   LABPROT 20.0* 20.0* 26.0*  INR 1.77* 1.77* 2.52*  CREATININE 1.04 0.75  --     Estimated Creatinine Clearance: 135.6 ml/min (by C-G formula based on Cr of 0.75).      Assessment: 64 YOM admitted with with AMS, possibly due to infection.  Patient continues on Coumadin for history of DVT.  INR increased significantly to therapeutic level today; no bleeding reported.  Noted patient continues on empiric Cipro, which could increase the effect of Coumadin.  She has excellent renal function.   Goal of Therapy:  INR 2-3 Monitor platelets by anticoagulation protocol: Yes Clearance of infection    Plan:  - Coumadin 1gm PO today - Cipro 400mg  IV Q12H ==> change to 500mg  PO BID - Daily PT / INR - Monitor renal function and abx LOT     Brown Dunlap D. Laney Potash, PharmD, BCPS Pager:  [phone] 09/08/2012, 9:00 AM

## 2012-09-08 NOTE — Progress Notes (Addendum)
Pt still awaitng psych md to see pt.  Spoke with Dr. Benjamine Mola who states, "attempted to reach psych md.  Will wait for around hour to see if he comes.  If not, will dc pt without consult since patient is not suicidal, homicidal, or delusional."

## 2012-09-08 NOTE — Progress Notes (Signed)
Patient no urine output the whole night.  Bladder noted to be distended.  Bladder scan done, volume retention at 428 ml.  In and out catheterization done per protocol, output at 320 ml.  Reinforced teaching on importance of emptying bladder.  Patient verbalized difficulty of urinating at the hospital, but added that he will try to void.  Will monitor and evaluate.

## 2012-09-08 NOTE — Care Management Note (Signed)
    Page 1 of 1   09/08/2012     3:31:36 PM   CARE MANAGEMENT NOTE 09/08/2012  Patient:  Alex Mcintosh, Alex Mcintosh   Account Number:  [id number]  Date Initiated:  09/08/2012  Documentation initiated by:  Theia Dezeeuw  Subjective/Objective Assessment:   PT ADM ON 09/06/12 WITH ACUTE ENCEPHALOPATHY.  PTA, PT INDEPENDENT, LIVES WITH SPOUSE.     Action/Plan:   WILL FOLLOW FOR HOME NEEDS AS PT PROGRESSES.   Anticipated DC Date:  09/08/2012   Anticipated DC Plan:  HOME/SELF CARE      DC Planning Services  CM consult      Choice offered to / List presented to:             Status of service:  Completed, signed off Medicare Important Message given?   (If response is "NO", the following Medicare IM given date fields will be blank) Date Medicare IM given:   Date Additional Medicare IM given:    Discharge Disposition:  HOME/SELF CARE  Per UR Regulation:  Reviewed for med. necessity/level of care/duration of stay  If discussed at Long Length of Stay Meetings, dates discussed:    Comments:

## 2012-09-12 LAB — CULTURE, BLOOD (ROUTINE X 2): Culture: NO GROWTH

## 2012-10-26 NOTE — ED Provider Notes (Signed)
History     CSN: [id number]  Arrival date & time 09/06/12  0124   First MD Initiated Contact with Patient 09/06/12 0126      Chief Complaint  Patient presents with  . Altered Mental Status  . Back Pain    (Consider location/radiation/quality/duration/timing/severity/associated sxs/prior treatment) Patient is a 62 y.o. male presenting with altered mental status and back pain.  Altered Mental Status Back Pain  please note that this is a late entry. This patient was seen and examined by me on the day of his presentation to the emergency department. The patient is a 62 year old man with multiple medical problems including seizure disorder chronic back pain, opiate dependence who is currently being treated for venous thromboembolism with warfarin.  He presents with altered mental status. He also has aching bilateral back pain. History is difficult to obtain from the patient. There is no documentation or knowledge of any recent fever.  Past Medical History  Diagnosis Date  . Hyperlipemia   . Chronic back pain   . Seizures   . GERD (gastroesophageal reflux disease)   . Anxiety   . Chronic narcotic dependence   . Hypothyroidism   . Depression   . Myoclonus     Past Surgical History  Procedure Laterality Date  . Back surgery    . Hip surgery    . Cervical fusion    . Knee arthroscopy      Family History  Problem Relation Age of Onset  . CAD Father     History  Substance Use Topics  . Smoking status: Never Smoker   . Smokeless tobacco: Never Used  . Alcohol Use: No      Review of Systems  Musculoskeletal: Positive for back pain.  Psychiatric/Behavioral: Positive for altered mental status.  Unable to obtain further ros due to altered mental status  Allergies  Ceftin and Statins  Home Medications   Current Outpatient Rx  Name  Route  Sig  Dispense  Refill  . carisoprodol (SOMA) 350 MG tablet   Oral   Take 350 mg by mouth 4 (four) times daily.         .  DULoxetine (CYMBALTA) 60 MG capsule   Oral   Take 120 mg by mouth daily.         . Eszopiclone (ESZOPICLONE) 3 MG TABS   Oral   Take 3 mg by mouth at bedtime. Take immediately before bedtime         . ezetimibe (ZETIA) 10 MG tablet   Oral   Take 10 mg by mouth daily.         Marland Kitchen omeprazole (PRILOSEC) 20 MG capsule   Oral   Take 20 mg by mouth daily.         Marland Kitchen oxymorphone (OPANA) 10 MG tablet   Oral   Take 20 mg by mouth every 6 (six) hours as needed. Pain.         . propranolol (INDERAL) 20 MG tablet   Oral   Take 20 mg by mouth 2 (two) times daily.         . Tapentadol HCl (NUCYNTA ER) 250 MG TB12   Oral   Take 1 tablet by mouth 2 (two) times daily.         . temazepam (RESTORIL) 15 MG capsule   Oral   Take 15 mg by mouth at bedtime.         Marland Kitchen warfarin (COUMADIN) 5 MG tablet   Oral  Take 2.5-5 mg by mouth every morning. 1 tablet daily except on Tuesday and Wednesday patient takes 1/2 tablet         . diazepam (VALIUM) 2 MG tablet   Oral   Take 1 tablet (2 mg total) by mouth 4 (four) times daily.   15 tablet   0   . morphine (MSIR) 15 MG tablet   Oral   Take 3 tablets (45 mg total) by mouth every 6 (six) hours as needed.   46 tablet   0   . tamsulosin (FLOMAX) 0.4 MG CAPS   Oral   Take 1 capsule (0.4 mg total) by mouth daily after supper.   30 capsule   0     BP 123/71  Pulse 73  Temp(Src) 97.1 F (36.2 C) (Oral)  Resp 17  Ht 5\' 10"  (1.778 m)  Wt 290 lb (131.543 kg)  BMI 41.61 kg/m2  SpO2 91%  Physical Exam Gen: well developed and well nourished appearing, somnolent and difficult to arouse Head: NCAT Eyes: PERL, EOMI, pupils are 2-3 mm Nose: no epistaixis or rhinorrhea Mouth/throat: mucosa is mildly dehydrated appearing and pink Neck: supple, no stridor Lungs: CTA B, no wheezing, rhonchi or rales Heart-regular rate and rhythm, no murmur, ext well perfused Abd: soft, notender, nondistended Back: no ttp, no cva ttp Skin: no  rashese, wnl Neuro: CN ii-xii grossly intact, moves all 4 ext Psyche; flat affect  ED Course  Procedures (including critical care time)  Labs Reviewed  PROTIME-INR - Abnormal; Notable for the following:    Prothrombin Time 20.0 (*)    INR 1.77 (*)    All other components within normal limits  APTT - Abnormal; Notable for the following:    aPTT 21 (*)    All other components within normal limits  URINALYSIS, ROUTINE W REFLEX MICROSCOPIC - Abnormal; Notable for the following:    Color, Urine ORANGE (*)    APPearance CLOUDY (*)    Specific Gravity, Urine 1.036 (*)    Bilirubin Urine SMALL (*)    Ketones, ur 15 (*)    Protein, ur 30 (*)    Leukocytes, UA TRACE (*)    All other components within normal limits  CBC WITH DIFFERENTIAL - Abnormal; Notable for the following:    Monocytes Absolute 1.1 (*)    All other components within normal limits  COMPREHENSIVE METABOLIC PANEL - Abnormal; Notable for the following:    Glucose, Bld 109 (*)    Albumin 3.3 (*)    GFR calc non Af Amer 77 (*)    GFR calc Af Amer 89 (*)    All other components within normal limits  URINE MICROSCOPIC-ADD ON - Abnormal; Notable for the following:    Bacteria, UA MANY (*)    Casts HYALINE CASTS (*)    All other components within normal limits  URINE RAPID DRUG SCREEN (HOSP PERFORMED) - Abnormal; Notable for the following:    Opiates POSITIVE (*)    Benzodiazepines POSITIVE (*)    All other components within normal limits  URINE RAPID DRUG SCREEN (HOSP PERFORMED) - Abnormal; Notable for the following:    Benzodiazepines POSITIVE (*)    All other components within normal limits  BASIC METABOLIC PANEL - Abnormal; Notable for the following:    Potassium 3.4 (*)    Glucose, Bld 116 (*)    All other components within normal limits  PROTIME-INR - Abnormal; Notable for the following:    Prothrombin Time 20.0 (*)  INR 1.77 (*)    All other components within normal limits  PROTIME-INR - Abnormal; Notable  for the following:    Prothrombin Time 26.0 (*)    INR 2.52 (*)    All other components within normal limits  URINE CULTURE  CULTURE, BLOOD (ROUTINE X 2)  CULTURE, BLOOD (ROUTINE X 2)  AMMONIA  TSH  RPR  VITAMIN B12  CBC   No results found.   1. Altered mental state   2. UTI (lower urinary tract infection)   3. Dehydration   4. Narcotic dependence   5. Acute encephalopathy   6. Chronic pain   7. History of DVT of lower extremity   8. Hyperlipidemia   9. Anxiety disorder   10. Major depressive disorder, recurrent episode, unspecified       MDM  Patient with acute encephalopathy - may be drug related or secondary to UTI. CT brain negative for acute intracranial process. Case discussed with admitted service and the patient will be evaluated for admission.         Brandt Loosen, MD 10/26/12 [phone]

## 2012-11-01 ENCOUNTER — Encounter (HOSPITAL_COMMUNITY): Payer: Self-pay | Admitting: Pharmacy Technician

## 2012-11-08 ENCOUNTER — Emergency Department (HOSPITAL_COMMUNITY)
Admission: EM | Admit: 2012-11-08 | Discharge: 2012-11-08 | Disposition: A | Discharge status: Home / Self Care | Payer: Medicare Other | Attending: Emergency Medicine | Admitting: Emergency Medicine

## 2012-11-08 ENCOUNTER — Emergency Department (HOSPITAL_COMMUNITY): Payer: Medicare Other

## 2012-11-08 ENCOUNTER — Encounter (HOSPITAL_COMMUNITY): Payer: Self-pay

## 2012-11-08 DIAGNOSIS — Z862 Personal history of diseases of the blood and blood-forming organs and certain disorders involving the immune mechanism: Secondary | ICD-10-CM | POA: Insufficient documentation

## 2012-11-08 DIAGNOSIS — M25559 Pain in unspecified hip: Secondary | ICD-10-CM | POA: Insufficient documentation

## 2012-11-08 DIAGNOSIS — Z79899 Other long term (current) drug therapy: Secondary | ICD-10-CM | POA: Insufficient documentation

## 2012-11-08 DIAGNOSIS — Z7901 Long term (current) use of anticoagulants: Secondary | ICD-10-CM | POA: Insufficient documentation

## 2012-11-08 DIAGNOSIS — M549 Dorsalgia, unspecified: Secondary | ICD-10-CM | POA: Insufficient documentation

## 2012-11-08 DIAGNOSIS — Z87440 Personal history of urinary (tract) infections: Secondary | ICD-10-CM | POA: Insufficient documentation

## 2012-11-08 DIAGNOSIS — Z8669 Personal history of other diseases of the nervous system and sense organs: Secondary | ICD-10-CM | POA: Insufficient documentation

## 2012-11-08 DIAGNOSIS — Z8739 Personal history of other diseases of the musculoskeletal system and connective tissue: Secondary | ICD-10-CM | POA: Insufficient documentation

## 2012-11-08 DIAGNOSIS — K219 Gastro-esophageal reflux disease without esophagitis: Secondary | ICD-10-CM | POA: Insufficient documentation

## 2012-11-08 DIAGNOSIS — G8929 Other chronic pain: Secondary | ICD-10-CM | POA: Insufficient documentation

## 2012-11-08 DIAGNOSIS — Z86718 Personal history of other venous thrombosis and embolism: Secondary | ICD-10-CM | POA: Insufficient documentation

## 2012-11-08 DIAGNOSIS — Z8639 Personal history of other endocrine, nutritional and metabolic disease: Secondary | ICD-10-CM | POA: Insufficient documentation

## 2012-11-08 DIAGNOSIS — Z8659 Personal history of other mental and behavioral disorders: Secondary | ICD-10-CM | POA: Insufficient documentation

## 2012-11-08 DIAGNOSIS — I509 Heart failure, unspecified: Secondary | ICD-10-CM | POA: Insufficient documentation

## 2012-11-08 DIAGNOSIS — G40909 Epilepsy, unspecified, not intractable, without status epilepticus: Secondary | ICD-10-CM | POA: Insufficient documentation

## 2012-11-08 DIAGNOSIS — M25552 Pain in left hip: Secondary | ICD-10-CM

## 2012-11-08 DIAGNOSIS — F411 Generalized anxiety disorder: Secondary | ICD-10-CM | POA: Insufficient documentation

## 2012-11-08 LAB — CBC WITH DIFFERENTIAL/PLATELET
Basophils Absolute: 0 10*3/uL (ref 0.0–0.1)
HCT: 44.6 % (ref 39.0–52.0)
Hemoglobin: 15 g/dL (ref 13.0–17.0)
Lymphocytes Relative: 39 % (ref 12–46)
Monocytes Absolute: 0.8 10*3/uL (ref 0.1–1.0)
Monocytes Relative: 10 % (ref 3–12)
Neutro Abs: 4 10*3/uL (ref 1.7–7.7)
RBC: 5.27 MIL/uL (ref 4.22–5.81)
WBC: 8.1 10*3/uL (ref 4.0–10.5)

## 2012-11-08 LAB — POCT I-STAT, CHEM 8
BUN: 12 mg/dL (ref 6–23)
Calcium, Ion: 1.15 mmol/L (ref 1.12–1.23)
Chloride: 102 mEq/L (ref 96–112)

## 2012-11-08 LAB — PROTIME-INR: INR: 1.05 (ref 0.00–1.49)

## 2012-11-08 NOTE — ED Notes (Signed)
Pt takes diazepam for seizures. But no meds since May.

## 2012-11-08 NOTE — ED Provider Notes (Signed)
History    CSN: [id number] Arrival date & time 11/08/12  0013  First MD Initiated Contact with Patient 11/08/12 0030     Chief Complaint  Patient presents with  . Seizures  . Hip Pain   (Consider location/radiation/quality/duration/timing/severity/associated sxs/prior Treatment) HPI Comments: Patient has a history of myoclonic seizures, being treated with 2 mg of Valium 3 times a day for the last several years.  Recently his medical provider has stopped prescribing this to 2 a suspected overdose, and ICU admission, while out-of-town.  He has not been referred to a neurologist for further evaluation of his seizures.  He, states he was seen by a neurologist for 5 years ago in [facility], and was prescribed, Neurontin, which did not work, and then the neurologist refused to see the patient.  Again.  His primary care physician will not write prescriptions for Valium for seizure control.  He does have an appointment with his pain clinic, tomorrow. Today.  He says he had a seizure for 15 minutes about 5:30 and then 4 hours later noticed he was having left hip pain.  Patient does currently take Coumadin for a DVT.  He has not had an INR checked in over a month.  He does, state that on Thursday.  He is to stop taking his Coumadin for a dental surgery.  Patient is a 62 y.o. male presenting with seizures and hip pain. The history is provided by the patient and the spouse.  Seizures Seizure activity on arrival: no   Seizure type:  Myoclonic Initial focality:  Diffuse Episode characteristics: abnormal movements   Return to baseline: yes   Severity:  Moderate Duration:  15 minutes Context: medical non-compliance   PTA treatment:  Diazepam History of seizures: yes   Hip Pain Pertinent negatives include no joint swelling.   Past Medical History  Diagnosis Date  . Hyperlipemia   . Chronic back pain   . Seizures   . GERD (gastroesophageal reflux disease)   . Anxiety   . Chronic narcotic  dependence   . Hypothyroidism   . Depression   . Myoclonus    Past Surgical History  Procedure Laterality Date  . Back surgery    . Hip surgery    . Cervical fusion    . Knee arthroscopy     Family History  Problem Relation Age of Onset  . CAD Father    History  Substance Use Topics  . Smoking status: Never Smoker   . Smokeless tobacco: Never Used  . Alcohol Use: No    Review of Systems  Constitutional: Negative for activity change.  Respiratory: Negative for shortness of breath.   Cardiovascular: Negative for leg swelling.  Musculoskeletal: Negative for joint swelling.  Skin: Negative for color change and wound.  Neurological: Positive for seizures.  All other systems reviewed and are negative.    Allergies  Ceftin and Statins  Home Medications   Current Outpatient Rx  Name  Route  Sig  Dispense  Refill  . carisoprodol (SOMA) 350 MG tablet   Oral   Take 350 mg by mouth 4 (four) times daily.         . diazepam (VALIUM) 2 MG tablet   Oral   Take 2 mg by mouth every 6 (six) hours as needed for anxiety.         . DULoxetine (CYMBALTA) 60 MG capsule   Oral   Take 120 mg by mouth daily.         Marland Kitchen  Eszopiclone (ESZOPICLONE) 3 MG TABS   Oral   Take 3 mg by mouth at bedtime. Take immediately before bedtime         . ezetimibe (ZETIA) 10 MG tablet   Oral   Take 10 mg by mouth daily.         Marland Kitchen morphine (MSIR) 15 MG tablet   Oral   Take 45 mg by mouth every 6 (six) hours as needed for pain.         Marland Kitchen omeprazole (PRILOSEC) 20 MG capsule   Oral   Take 20 mg by mouth daily.         Marland Kitchen oxymorphone (OPANA ER) 10 MG 12 hr tablet   Oral   Take 10 mg by mouth 3 (three) times daily.         . propranolol (INDERAL) 20 MG tablet   Oral   Take 20 mg by mouth 2 (two) times daily.         . tamsulosin (FLOMAX) 0.4 MG CAPS   Oral   Take 0.4 mg by mouth daily after supper.         . Tapentadol HCl (NUCYNTA ER) 150 MG TB12   Oral   Take 1  tablet by mouth 2 (two) times daily.         . temazepam (RESTORIL) 15 MG capsule   Oral   Take 15 mg by mouth at bedtime.         Marland Kitchen warfarin (COUMADIN) 5 MG tablet   Oral   Take 5 mg by mouth every other day.           BP 142/81  Temp(Src) 98.6 F (37 C) (Oral)  Resp 18  SpO2 91% Physical Exam  Nursing note and vitals reviewed. Constitutional: He appears well-developed and well-nourished.  Eyes: Pupils are equal, round, and reactive to light.  Neck: Normal range of motion.  Cardiovascular: Normal rate.   Pulmonary/Chest: Effort normal.  Abdominal: Soft. Bowel sounds are normal.  Musculoskeletal: He exhibits tenderness. He exhibits no edema.       Back:  Skin: Skin is warm and dry. No rash noted. No erythema.    ED Course  Procedures (including critical care time) Labs Reviewed  POCT I-STAT, CHEM 8 - Abnormal; Notable for the following:    Glucose, Bld 125 (*)    All other components within normal limits  PROTIME-INR  CBC WITH DIFFERENTIAL   Dg Hip Complete Left  11/08/2012   *RADIOLOGY REPORT*  Clinical Data: Seizures.  Left hip pain.  LEFT HIP - COMPLETE 2+ VIEW  Comparison: 04/26/2012.  Findings: Pelvic rings appear intact.  The patient panniculus projects over the anatomic pelvis.  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  Uncomplicated left total hip arthroplasty.  The left hip appears located.  There is no periprosthetic fracture.  IMPRESSION: No acute abnormality.   Original Report Authenticated By: Andreas Newport, M.D.   1. Hip pain, acute, left   2. Seizure disorder     MDM   Will check INR, obtain xray as she has an appointment for his pain specialist in the morning X-ray reveals no fracture.  Patient's INR is subtherapeutic.  Today.  Fortunately, he has an appointment with his physician in the morning  Arman Filter, NP 11/08/12 [phone]

## 2012-11-08 NOTE — ED Notes (Signed)
Pt transported by EMS for myoclonic seizures and left hip pain. Seizures started 530 this evening. Pt reports seizure lasting about 15 minutes. Left hip pain started 1 hour ago. No injury noted to area from seizure. Hx of hip replacement. Treats chronic pain with morphine with no relief today. 5 versed IN.

## 2012-11-08 NOTE — ED Provider Notes (Signed)
Medical screening examination/treatment/procedure(s) were performed by non-physician practitioner and as supervising physician I was immediately available for consultation/collaboration.  Mykenzi Vanzile M Abra Lingenfelter, MD 11/08/12 0326 

## 2012-11-08 NOTE — ED Notes (Signed)
BJY:[id number]<FA> Expected date:11/07/12<BR> Expected time:11:41 PM<BR> Means of arrival:Ambulance<BR> Comments:<BR> seizures

## 2012-11-09 ENCOUNTER — Encounter (HOSPITAL_COMMUNITY)
Admission: RE | Admit: 2012-11-09 | Discharge: 2012-11-09 | Disposition: A | Discharge status: Home / Self Care | Payer: Medicare Other | Source: Ambulatory Visit | Attending: Oral Surgery | Admitting: Oral Surgery

## 2012-11-09 ENCOUNTER — Encounter (HOSPITAL_COMMUNITY): Payer: Self-pay

## 2012-11-09 HISTORY — DX: Acute embolism and thrombosis of unspecified deep veins of unspecified lower extremity: I82.409

## 2012-11-09 HISTORY — DX: Foot drop, right foot: M21.372

## 2012-11-09 HISTORY — DX: Urinary tract infection, site not specified: N39.0

## 2012-11-09 HISTORY — DX: Shortness of breath: R06.02

## 2012-11-09 HISTORY — DX: Foot drop, right foot: M21.371

## 2012-11-09 HISTORY — DX: Polyneuropathy, unspecified: G62.9

## 2012-11-09 HISTORY — DX: Idiopathic aseptic necrosis of unspecified bone: M87.00

## 2012-11-09 HISTORY — DX: Encephalopathy, unspecified: G93.40

## 2012-11-09 HISTORY — DX: Other amnesia: R41.3

## 2012-11-09 HISTORY — DX: Unspecified osteoarthritis, unspecified site: M19.90

## 2012-11-09 HISTORY — DX: Heart failure, unspecified: I50.9

## 2012-11-09 NOTE — Progress Notes (Signed)
11/09/12 1211  OBSTRUCTIVE SLEEP APNEA  Have you ever been diagnosed with sleep apnea through a sleep study? No  Do you snore loudly (loud enough to be heard through closed doors)?  0  Do you often feel tired, fatigued, or sleepy during the daytime? 1  Has anyone observed you stop breathing during your sleep? 0  Do you have, or are you being treated for high blood pressure? 0  BMI more than 35 kg/m2? 1  Age over 60 years old? 1  Neck circumference greater than 40 cm/18 inches? 1  Gender: 1  Obstructive Sleep Apnea Score 5  Score 4 or greater  Results sent to PCP

## 2012-11-09 NOTE — Pre-Procedure Instructions (Addendum)
Alex Mcintosh  11/09/2012   Your procedure is scheduled on:  Thursday, July 10th.  Report to [facility] at 5:30AM.             Take [name] to 3rd Floor- Short Stay.  Call this number if you have problems the morning of surgery: [phone]   Remember:   Do not eat food or drink liquids after midnight.   Take these medicines the morning of surgery with A SIP OF WATER:DULoxetine (CYMBALTA), omeprazole (PRILOSEC),carisoprodol (SOMA),oxymorphone (OPANA ER),propranolol (INDERAL),tamsulosin (FLOMAX),Tapentadol HCl (NUCYNTA ER) Take if needed:diazepam (VALIUM)     Do not wear jewelry, make-up or nail polish.  Do not wear lotions, powders, or perfumes. You may wear deodorant.  Do not shave 48 hours prior to surgery. Men may shave face and neck.  Do not bring valuables to the hospital.  [facility] is not responsible  for any belongings or valuables.  Contacts, dentures or bridgework may not be worn into surgery.  Leave suitcase in the car. After surgery it may be brought to your room.  For patients admitted to the hospital, checkout time is 11:00 AM the day of discharge.   Patients discharged the day of surgery will not be allowed to drive home.  Name and phone number of your driver: --   Special Instructions: Shower using CHG 2 nights before surgery and the night before surgery.  If you shower the day of surgery use CHG.  Use special wash - you have one bottle of CHG for all showers.  You should use approximately 1/3 of the bottle for each shower.   Please read over the following fact sheets that you were given: Pain Booklet, Coughing and Deep Breathing and Surgical Site Infection Prevention

## 2012-11-13 ENCOUNTER — Encounter (HOSPITAL_COMMUNITY): Payer: Self-pay | Admitting: *Deleted

## 2012-11-13 NOTE — H&P (Signed)
Alex Mcintosh is an 62 y.o. male.   Chief Complaint: multiple decayed and non restorable teeth HPI: decayed teeth present for several months  Past Medical History  Diagnosis Date  . Hyperlipemia   . Chronic back pain   . GERD (gastroesophageal reflux disease)   . Anxiety   . Chronic narcotic dependence   . Hypothyroidism   . Depression   . Myoclonus   . UTI (lower urinary tract infection) 5/14    was confused and was found in [place].  Marland Kitchen Acute encephalopathy 09/06/12    was admitted to [facility]  . Foot drop, bilateral   . Shortness of breath     with exertion  . CHF (congestive heart failure)     diurects stopped in April  . DVT (deep venous thrombosis)     01/2012  . Seizures     Myoclonus  . Neuropathy     legs  . Arthritis   . Memory loss of unknown cause   . AVN (avascular necrosis of bone)     right    Past Surgical History  Procedure Laterality Date  . Back surgery      4 lumbar  . Hip surgery Left   . Cervical fusion    . Knee arthroscopy Right   . Joint replacement    . Pain pump implantation      Family History  Problem Relation Age of Onset  . CAD Father    Social History:  reports that he has never smoked. He has never used smokeless tobacco. He reports that he does not drink alcohol or use illicit drugs.  Allergies:  Allergies  Allergen Reactions  . Ceftin (Cefuroxime Axetil) Hives  . Statins     Messes with liver enzymes    No prescriptions prior to admission    No results found for this or any previous visit (from the past 48 hour(s)). No results found.  ROS  There were no vitals taken for this visit. Physical Exam  HENT:  Head: No trismus in the jaw.  Mouth/Throat: Uvula is midline, oropharynx is clear and moist and mucous membranes are normal. He does not have dentures. No oral lesions. Abnormal dentition. Dental abscesses and dental caries present. No edematous or lacerations.       Assessment/Plan Surgical removal of all non restorable  teeth with possible alveoplasties  Fidelia Cathers,JOSEPH L 11/13/2012, 5:07 PM

## 2012-11-13 NOTE — Progress Notes (Signed)
Dr. Rondel Baton office notified that orders need to be placed in EPIC.

## 2012-11-13 NOTE — Consult Note (Signed)
  This is a 62 y/o obese white male who presents with multiple significant medical problems as well as multiple non restorable teeth.  Because so many of the teeth are to be removed as well as several quadrants of alveoplasty it was recommended this be done in the OR at [facility]. The patient is not a candidate for office sedation.

## 2012-11-15 MED ORDER — CLINDAMYCIN PHOSPHATE 900 MG/50ML IV SOLN
900.0000 mg | Freq: Once | INTRAVENOUS | Status: AC
  Administered 2012-11-16: 900 mg via INTRAVENOUS
  Filled 2012-11-15: qty 50

## 2012-11-15 NOTE — Progress Notes (Deleted)
2nd request made to Dr. Dulce Sellar to complete peri-operative implanted device orders and fax to Short Stay.

## 2012-11-16 ENCOUNTER — Encounter (HOSPITAL_COMMUNITY)
Admission: RE | Disposition: A | Discharge status: Home / Self Care | Payer: Self-pay | Source: Ambulatory Visit | Attending: Oral Surgery

## 2012-11-16 ENCOUNTER — Encounter (HOSPITAL_COMMUNITY): Payer: Self-pay | Admitting: Critical Care Medicine

## 2012-11-16 ENCOUNTER — Ambulatory Visit (HOSPITAL_COMMUNITY)
Admission: RE | Admit: 2012-11-16 | Discharge: 2012-11-16 | Disposition: A | Discharge status: Home / Self Care | Payer: Medicare Other | Source: Ambulatory Visit | Attending: Oral Surgery | Admitting: Oral Surgery

## 2012-11-16 ENCOUNTER — Ambulatory Visit (HOSPITAL_COMMUNITY): Payer: Medicare Other | Admitting: Critical Care Medicine

## 2012-11-16 DIAGNOSIS — Z888 Allergy status to other drugs, medicaments and biological substances status: Secondary | ICD-10-CM | POA: Insufficient documentation

## 2012-11-16 DIAGNOSIS — N390 Urinary tract infection, site not specified: Secondary | ICD-10-CM

## 2012-11-16 DIAGNOSIS — F3289 Other specified depressive episodes: Secondary | ICD-10-CM | POA: Insufficient documentation

## 2012-11-16 DIAGNOSIS — K047 Periapical abscess without sinus: Secondary | ICD-10-CM | POA: Insufficient documentation

## 2012-11-16 DIAGNOSIS — M549 Dorsalgia, unspecified: Secondary | ICD-10-CM | POA: Insufficient documentation

## 2012-11-16 DIAGNOSIS — K219 Gastro-esophageal reflux disease without esophagitis: Secondary | ICD-10-CM | POA: Insufficient documentation

## 2012-11-16 DIAGNOSIS — M129 Arthropathy, unspecified: Secondary | ICD-10-CM | POA: Insufficient documentation

## 2012-11-16 DIAGNOSIS — F329 Major depressive disorder, single episode, unspecified: Secondary | ICD-10-CM | POA: Insufficient documentation

## 2012-11-16 DIAGNOSIS — G40309 Generalized idiopathic epilepsy and epileptic syndromes, not intractable, without status epilepticus: Secondary | ICD-10-CM | POA: Insufficient documentation

## 2012-11-16 DIAGNOSIS — E039 Hypothyroidism, unspecified: Secondary | ICD-10-CM | POA: Insufficient documentation

## 2012-11-16 DIAGNOSIS — G9340 Encephalopathy, unspecified: Secondary | ICD-10-CM

## 2012-11-16 DIAGNOSIS — Z86718 Personal history of other venous thrombosis and embolism: Secondary | ICD-10-CM | POA: Insufficient documentation

## 2012-11-16 DIAGNOSIS — I509 Heart failure, unspecified: Secondary | ICD-10-CM | POA: Insufficient documentation

## 2012-11-16 DIAGNOSIS — E785 Hyperlipidemia, unspecified: Secondary | ICD-10-CM

## 2012-11-16 DIAGNOSIS — F411 Generalized anxiety disorder: Secondary | ICD-10-CM | POA: Insufficient documentation

## 2012-11-16 DIAGNOSIS — K029 Dental caries, unspecified: Secondary | ICD-10-CM | POA: Insufficient documentation

## 2012-11-16 DIAGNOSIS — E669 Obesity, unspecified: Secondary | ICD-10-CM | POA: Insufficient documentation

## 2012-11-16 DIAGNOSIS — G8929 Other chronic pain: Secondary | ICD-10-CM | POA: Insufficient documentation

## 2012-11-16 HISTORY — PX: TOOTH EXTRACTION: SHX859

## 2012-11-16 LAB — BASIC METABOLIC PANEL
BUN: 13 mg/dL (ref 6–23)
Chloride: 103 mEq/L (ref 96–112)
GFR calc Af Amer: >90 mL/min (ref >90)
GFR calc non Af Amer: >90 mL/min (ref >90)
Glucose, Bld: 115 mg/dL — ABNORMAL HIGH (ref 70–99)
Potassium: 5.4 mEq/L — ABNORMAL HIGH (ref 3.5–5.1)
Sodium: 139 mEq/L (ref 135–145)

## 2012-11-16 LAB — PROTIME-INR: INR: 1.01 (ref 0.00–1.49)

## 2012-11-16 LAB — APTT: aPTT: 30 seconds (ref 24–37)

## 2012-11-16 SURGERY — DENTAL RESTORATION/EXTRACTIONS
Anesthesia: General | Site: Mouth | Wound class: Clean Contaminated

## 2012-11-16 MED ORDER — LIDOCAINE-EPINEPHRINE 2 %-1:100000 IJ SOLN
INTRAMUSCULAR | Status: AC
  Filled 2012-11-16: qty 5.1

## 2012-11-16 MED ORDER — SODIUM CHLORIDE 0.9 % IR SOLN
Status: DC | PRN
  Administered 2012-11-16: 1

## 2012-11-16 MED ORDER — OXYMETAZOLINE HCL 0.05 % NA SOLN
NASAL | Status: AC
  Filled 2012-11-16: qty 15

## 2012-11-16 MED ORDER — OXYMETAZOLINE HCL 0.05 % NA SOLN
NASAL | Status: DC | PRN
  Administered 2012-11-16: 1 via NASAL

## 2012-11-16 MED ORDER — HEMOSTATIC AGENTS (NO CHARGE) OPTIME
TOPICAL | Status: DC | PRN
  Administered 2012-11-16 (×2): 1 via TOPICAL

## 2012-11-16 MED ORDER — LIDOCAINE HCL (CARDIAC) 20 MG/ML IV SOLN
INTRAVENOUS | Status: DC | PRN
  Administered 2012-11-16: 50 mg via INTRAVENOUS

## 2012-11-16 MED ORDER — LACTATED RINGERS IV SOLN
INTRAVENOUS | Status: DC | PRN
  Administered 2012-11-16 (×2): via INTRAVENOUS

## 2012-11-16 MED ORDER — HYDROMORPHONE HCL PF 1 MG/ML IJ SOLN
INTRAMUSCULAR | Status: AC
  Filled 2012-11-16: qty 1

## 2012-11-16 MED ORDER — MIDAZOLAM HCL 5 MG/5ML IJ SOLN
INTRAMUSCULAR | Status: DC | PRN
  Administered 2012-11-16: 2 mg via INTRAVENOUS

## 2012-11-16 MED ORDER — PROPOFOL 10 MG/ML IV BOLUS
INTRAVENOUS | Status: DC | PRN
  Administered 2012-11-16: 50 mg via INTRAVENOUS
  Administered 2012-11-16: 150 mg via INTRAVENOUS

## 2012-11-16 MED ORDER — HYDROMORPHONE HCL PF 1 MG/ML IJ SOLN
0.2500 mg | INTRAMUSCULAR | Status: DC | PRN
  Administered 2012-11-16 (×4): 0.5 mg via INTRAVENOUS

## 2012-11-16 MED ORDER — ONDANSETRON HCL 4 MG/2ML IJ SOLN
INTRAMUSCULAR | Status: DC | PRN
  Administered 2012-11-16: 4 mg via INTRAVENOUS

## 2012-11-16 MED ORDER — LIDOCAINE-EPINEPHRINE 2 %-1:100000 IJ SOLN
INTRAMUSCULAR | Status: DC | PRN
  Administered 2012-11-16: 8.5 mL

## 2012-11-16 MED ORDER — LIDOCAINE-EPINEPHRINE 2 %-1:100000 IJ SOLN
INTRAMUSCULAR | Status: AC
  Filled 2012-11-16: qty 3.4

## 2012-11-16 MED ORDER — DEXAMETHASONE SODIUM PHOSPHATE 4 MG/ML IJ SOLN
INTRAMUSCULAR | Status: DC | PRN
  Administered 2012-11-16: 4 mg via INTRAVENOUS

## 2012-11-16 MED ORDER — FENTANYL CITRATE 0.05 MG/ML IJ SOLN
INTRAMUSCULAR | Status: DC | PRN
  Administered 2012-11-16 (×4): 50 ug via INTRAVENOUS

## 2012-11-16 SURGICAL SUPPLY — 34 items
ALCOHOL 70% 16 OZ (MISCELLANEOUS) ×2 IMPLANT
BLADE SURG 15 STRL LF DISP TIS (BLADE) ×1 IMPLANT
BLADE SURG 15 STRL SS (BLADE) ×1
BUR RND FLUTED 2.5 (BURR) ×2 IMPLANT
BUR STRYKR 2.5 FLUT MED (BURR) ×2 IMPLANT
BUR SURG 4X8 MED (BURR) ×1 IMPLANT
BURR SURG 4X8 MED (BURR) ×2
CANISTER SUCTION 2500CC (MISCELLANEOUS) ×2 IMPLANT
CLOTH BEACON ORANGE TIMEOUT ST (SAFETY) ×2 IMPLANT
COVER SURGICAL LIGHT HANDLE (MISCELLANEOUS) ×2 IMPLANT
GAUZE PACKING FOLDED 2  STR (GAUZE/BANDAGES/DRESSINGS) ×1
GAUZE PACKING FOLDED 2 STR (GAUZE/BANDAGES/DRESSINGS) ×1 IMPLANT
GAUZE SPONGE 4X4 16PLY XRAY LF (GAUZE/BANDAGES/DRESSINGS) ×2 IMPLANT
GLOVE BIO SURGEON STRL SZ 6.5 (GLOVE) ×2 IMPLANT
GLOVE BIO SURGEON STRL SZ7.5 (GLOVE) ×4 IMPLANT
GOWN PREVENTION PLUS XLARGE (GOWN DISPOSABLE) ×2 IMPLANT
GOWN STRL NON-REIN LRG LVL3 (GOWN DISPOSABLE) ×4 IMPLANT
KIT BASIN OR (CUSTOM PROCEDURE TRAY) ×2 IMPLANT
KIT ROOM TURNOVER OR (KITS) ×2 IMPLANT
NEEDLE BLUNT 16X1.5 OR ONLY (NEEDLE) ×4 IMPLANT
NEEDLE DENTAL 27 LONG (NEEDLE) ×2 IMPLANT
NS IRRIG 1000ML POUR BTL (IV SOLUTION) ×2 IMPLANT
PACK EENT II TURBAN DRAPE (CUSTOM PROCEDURE TRAY) ×2 IMPLANT
PAD ARMBOARD 7.5X6 YLW CONV (MISCELLANEOUS) ×4 IMPLANT
SPONGE GAUZE 4X4 12PLY (GAUZE/BANDAGES/DRESSINGS) ×2 IMPLANT
SPONGE SURGIFOAM ABS GEL 12-7 (HEMOSTASIS) ×4 IMPLANT
SUT CHROMIC 3 0 PS 2 (SUTURE) ×4 IMPLANT
SYR 50ML SLIP (SYRINGE) ×4 IMPLANT
TOWEL OR 17X26 10 PK STRL BLUE (TOWEL DISPOSABLE) ×2 IMPLANT
TUBE CONNECTING 12X1/4 (SUCTIONS) ×2 IMPLANT
TUBING IRRIGATION (MISCELLANEOUS) IMPLANT
VENT IRR SPI W TUB AD (MISCELLANEOUS) ×2 IMPLANT
WATER STERILE IRR 1000ML POUR (IV SOLUTION) ×2 IMPLANT
YANKAUER SUCT BULB TIP NO VENT (SUCTIONS) ×2 IMPLANT

## 2012-11-16 NOTE — Preoperative (Signed)
Beta Blockers   Reason not to administer Beta Blockers:Not Applicable 

## 2012-11-16 NOTE — Transfer of Care (Signed)
Immediate Anesthesia Transfer of Care Note  Patient: Alex Mcintosh  Procedure(s) Performed: Procedure(s) with comments: DENTAL EXTRACTIONS OF MULTIPLE NONRESTORABLE TEETH (N/A) - #12, 14, 15, 22, 23, 24, 25, 26, 27, 28  Patient Location: PACU  Anesthesia Type:General  Level of Consciousness: awake and alert   Airway & Oxygen Therapy: Patient Spontanous Breathing and Patient connected to face mask oxygen  Post-op Assessment: Report given to PACU RN, Post -op Vital signs reviewed and stable and Patient moving all extremities X 4  Post vital signs: Reviewed and stable  Complications: No apparent anesthesia complications

## 2012-11-16 NOTE — H&P (Signed)
  There is no change in the medical history

## 2012-11-16 NOTE — Op Note (Signed)
Patient was brought to the operating room in the supine position and which remained throughout the whole procedure. He was intubated via right nasoendotracheal tube and prepped and draped in the usual fashion for an intraoral procedure. 3 carpules for a total of 4.5 cc of 2% Xylocaine with 1-100,000 epinephrine was given as bilateral blocks and infiltrations of the maxilla and palate. The mouth was examined and the treatment plan was consistent with the preoperative treatment plan. #22 and #27 small areas of decayed and nonrestorable. I recommended leave these teeth at this time. A periosteal elevator was used to create a full-thickness mucoperiosteal flap around #12#14 and #15. Each tooth was mobilized with an 11-A. elevator area in #12 was removed with an upper universal forceps. The buccal plate was intact. The socket was curetted irrigated and Gelfoam was placed. The soft tissue was closed with a 3-0 chromic suture. #14 was sectioned with a round bur and copious irrigation. It was split with an 11-A. elevator and removed in multiple pieces with a rongeur. #15 was sectioned and removed in 2 pieces. The bone was trimmed so that the ridge was smooth. The sockets were curetted and irrigated. Gelfoam was placed in the sockets and the soft tissue was closed with multiple 3-0 chromic sutures. A #15 blade made an incision extending from #23 to #26. Each root was then mobilized with an 11-A elevator and then removed with a lower universal forceps. The buccal plates intact. The sockets were curetted and irrigated. #22 and #27 appeared nonrestorable and therefore were not removed. Gelfoam was placed in the socket and the soft tissue was closed with multiple 3-0 chromic sutures. A periosteal elevator reflected a full-thickness mucoperiosteal flap around #28. The tooth was fractured off and the alveolus. It was mobilized with an 11-A. elevator and removed with a rongeur. The socket was curetted irrigated and Gelfoam was  placed. The buccal plates intact. The soft tissue was closed with multiple 3-0 chromic sutures. It was good hemostasis throughout the mouth. There was no elevator evidence of decayed. The mouth was suctioned out and the throat pack was removed. The patient tolerated the procedure well was extubated on the table and returned to the recovery room in good condition.  []    P;]

## 2012-11-16 NOTE — Anesthesia Postprocedure Evaluation (Signed)
  Anesthesia Post-op Note  Patient: Alex Mcintosh  Procedure(s) Performed: Procedure(s) with comments: DENTAL EXTRACTIONS OF MULTIPLE NONRESTORABLE TEETH (N/A) - #12, 14, 15, 22, 23, 24, 25, 26, 27, 28  Patient Location: PACU  Anesthesia Type:General  Level of Consciousness: awake  Airway and Oxygen Therapy: Patient Spontanous Breathing  Post-op Pain: mild  Post-op Assessment: Post-op Vital signs reviewed  Post-op Vital Signs: Reviewed  Complications: No apparent anesthesia complications

## 2012-11-16 NOTE — Anesthesia Preprocedure Evaluation (Addendum)
Anesthesia Evaluation  Patient identified by MRN, date of birth, ID band Patient awake    Reviewed: Allergy & Precautions, H&P , NPO status , Patient's Chart, lab work & pertinent test results  Airway Mallampati: II TM Distance: >3 FB Neck ROM: Limited    Dental  (+) Dental Advisory Given and Teeth Intact   Pulmonary shortness of breath and with exertion,  breath sounds clear to auscultation        Cardiovascular +CHF and DVT (on coumadin ) Rhythm:Regular     Neuro/Psych Seizures -,  PSYCHIATRIC DISORDERS Anxiety Depression Bilateral foot drop    GI/Hepatic Neg liver ROS, GERD-  Medicated,  Endo/Other  Hypothyroidism Morbid obesity  Renal/GU negative Renal ROS     Musculoskeletal  (+) Arthritis -,   Abdominal   Peds  Hematology   Anesthesia Other Findings   Reproductive/Obstetrics                        Anesthesia Physical Anesthesia Plan  ASA: III  Anesthesia Plan: General   Post-op Pain Management:    Induction: Intravenous  Airway Management Planned: Nasal ETT  Additional Equipment:   Intra-op Plan:   Post-operative Plan: Extubation in OR  Informed Consent: I have reviewed the patients History and Physical, chart, labs and discussed the procedure including the risks, benefits and alternatives for the proposed anesthesia with the patient or authorized representative who has indicated his/her understanding and acceptance.   Dental advisory given  Plan Discussed with: CRNA, Anesthesiologist and Surgeon  Anesthesia Plan Comments:         Anesthesia Quick Evaluation

## 2012-11-16 NOTE — Brief Op Note (Signed)
11/16/2012  8:36 AM  PATIENT:  Alex Mcintosh  62 y.o. male  PRE-OPERATIVE DIAGNOSIS:  MULTIPLE NONRESTORABLE TEETH/SEVERE CARDIAC HISTORY  POST-OPERATIVE DIAGNOSIS:  MULTIPLE NONRESTORABLE TEETH/SEVERE CARDIAC HISTORY  PROCEDURE:  Procedure(s) with comments: DENTAL EXTRACTIONS OF MULTIPLE NONRESTORABLE TEETH (N/A) - #12, 14, 15, 22, 23, 24, 25, 26, 27, 28  SURGEON:  Surgeon(s) and Role:    * Hinton Dyer, DDS - Primary  PHYSICIAN ASSISTANT:   ASSISTANTS: Hadassah Pais, Lequita Halt Arledge   ANESTHESIA:   general  EBL:  Total I/O In: 1000 [I.V.:1000] Out: 25 [Blood:25]  BLOOD ADMINISTERED:none  DRAINS: none   LOCAL MEDICATIONS USED:  XYLOCAINE   SPECIMEN:  No Specimen  DISPOSITION OF SPECIMEN:  N/A  COUNTS:  YES  TOURNIQUET:  * No tourniquets in log *  DICTATION: .Dragon Dictation  PLAN OF CARE: Discharge to home after PACU  PATIENT DISPOSITION:  PACU - hemodynamically stable.   Delay start of Pharmacological VTE agent (>24hrs) due to surgical blood loss or risk of bleeding: yes

## 2012-11-16 NOTE — Anesthesia Procedure Notes (Signed)
Procedure Name: Intubation Date/Time: 11/16/2012 7:48 AM Performed by: Elon Alas Pre-anesthesia Checklist: Patient identified, Timeout performed, Emergency Drugs available, Suction available and Patient being monitored Patient Re-evaluated:Patient Re-evaluated prior to inductionOxygen Delivery Method: Circle system utilized Preoxygenation: Pre-oxygenation with 100% oxygen Intubation Type: IV induction Ventilation: Two handed mask ventilation required, Oral airway inserted - appropriate to patient size and Mask ventilation with difficulty Grade View: Grade I Nasal Tubes: Nasal Rae and Right Tube size: 7.5 mm Number of attempts: 1 Airway Equipment and Method: Video-laryngoscopy Placement Confirmation: positive ETCO2,  ETT inserted through vocal cords under direct vision and breath sounds checked- equal and bilateral Tube secured with: Tape Dental Injury: Teeth and Oropharynx as per pre-operative assessment

## 2012-11-17 ENCOUNTER — Encounter (HOSPITAL_COMMUNITY): Payer: Self-pay | Admitting: Oral Surgery

## 2013-01-07 ENCOUNTER — Emergency Department (HOSPITAL_COMMUNITY)
Admission: EM | Admit: 2013-01-07 | Discharge: 2013-01-07 | Disposition: A | Discharge status: Home / Self Care | Payer: Medicare Other | Attending: Emergency Medicine | Admitting: Emergency Medicine

## 2013-01-07 ENCOUNTER — Observation Stay (HOSPITAL_BASED_OUTPATIENT_CLINIC_OR_DEPARTMENT_OTHER)
Admission: EM | Admit: 2013-01-07 | Discharge: 2013-01-09 | Disposition: A | Discharge status: Home / Self Care | Payer: Medicare Other | Source: Home / Self Care | Attending: Emergency Medicine | Admitting: Emergency Medicine

## 2013-01-07 ENCOUNTER — Encounter (HOSPITAL_COMMUNITY): Payer: Self-pay | Admitting: Emergency Medicine

## 2013-01-07 ENCOUNTER — Emergency Department (HOSPITAL_COMMUNITY): Payer: Medicare Other

## 2013-01-07 DIAGNOSIS — E785 Hyperlipidemia, unspecified: Secondary | ICD-10-CM | POA: Diagnosis present

## 2013-01-07 DIAGNOSIS — Z7901 Long term (current) use of anticoagulants: Secondary | ICD-10-CM | POA: Insufficient documentation

## 2013-01-07 DIAGNOSIS — Z79899 Other long term (current) drug therapy: Secondary | ICD-10-CM | POA: Insufficient documentation

## 2013-01-07 DIAGNOSIS — Z86718 Personal history of other venous thrombosis and embolism: Secondary | ICD-10-CM | POA: Insufficient documentation

## 2013-01-07 DIAGNOSIS — R791 Abnormal coagulation profile: Secondary | ICD-10-CM

## 2013-01-07 DIAGNOSIS — R0789 Other chest pain: Secondary | ICD-10-CM

## 2013-01-07 DIAGNOSIS — I509 Heart failure, unspecified: Secondary | ICD-10-CM | POA: Insufficient documentation

## 2013-01-07 DIAGNOSIS — G8929 Other chronic pain: Secondary | ICD-10-CM | POA: Insufficient documentation

## 2013-01-07 DIAGNOSIS — R079 Chest pain, unspecified: Secondary | ICD-10-CM

## 2013-01-07 DIAGNOSIS — R609 Edema, unspecified: Secondary | ICD-10-CM | POA: Insufficient documentation

## 2013-01-07 DIAGNOSIS — W19XXXA Unspecified fall, initial encounter: Secondary | ICD-10-CM | POA: Insufficient documentation

## 2013-01-07 DIAGNOSIS — Z23 Encounter for immunization: Secondary | ICD-10-CM | POA: Insufficient documentation

## 2013-01-07 LAB — BASIC METABOLIC PANEL
BUN: 7 mg/dL (ref 6–23)
Chloride: 104 mEq/L (ref 96–112)
GFR calc Af Amer: >90 mL/min (ref >90)
Potassium: 3.9 mEq/L (ref 3.5–5.1)
Sodium: 140 mEq/L (ref 135–145)

## 2013-01-07 LAB — CBC WITH DIFFERENTIAL/PLATELET
HCT: 41.5 % (ref 39.0–52.0)
Hemoglobin: 14.1 g/dL (ref 13.0–17.0)
Lymphocytes Relative: 24 % (ref 12–46)
Lymphs Abs: 3.4 10*3/uL (ref 0.7–4.0)
MCHC: 34 g/dL (ref 30.0–36.0)
Monocytes Absolute: 1 10*3/uL (ref 0.1–1.0)
Monocytes Relative: 7 % (ref 3–12)
Neutro Abs: 9.9 10*3/uL — ABNORMAL HIGH (ref 1.7–7.7)
RBC: 4.89 MIL/uL (ref 4.22–5.81)
WBC: 14.3 10*3/uL — ABNORMAL HIGH (ref 4.0–10.5)

## 2013-01-07 LAB — POCT I-STAT TROPONIN I

## 2013-01-07 LAB — URINALYSIS, ROUTINE W REFLEX MICROSCOPIC
Glucose, UA: NEGATIVE mg/dL
Leukocytes, UA: NEGATIVE
Nitrite: NEGATIVE
Specific Gravity, Urine: 1.01 (ref 1.005–1.030)
pH: 7 (ref 5.0–8.0)

## 2013-01-07 MED ORDER — DIAZEPAM 2 MG PO TABS: 2.0000 mg | ORAL_TABLET | Freq: Four times a day (QID) | ORAL | Status: DC | PRN

## 2013-01-07 MED ORDER — CARISOPRODOL 350 MG PO TABS
350.0000 mg | ORAL_TABLET | Freq: Three times a day (TID) | ORAL | Status: DC | PRN
  Administered 2013-01-08: 350 mg via ORAL
  Filled 2013-01-07: qty 1

## 2013-01-07 MED ORDER — SODIUM CHLORIDE 0.9 % IJ SOLN: 3.0000 mL | Freq: Two times a day (BID) | INTRAMUSCULAR | Status: DC

## 2013-01-07 MED ORDER — ZOLPIDEM TARTRATE 5 MG PO TABS: 5.0000 mg | ORAL_TABLET | Freq: Every evening | ORAL | Status: DC | PRN

## 2013-01-07 MED ORDER — SODIUM CHLORIDE 0.9 % IJ SOLN: 3.0000 mL | INTRAMUSCULAR | Status: DC | PRN

## 2013-01-07 MED ORDER — MORPHINE SULFATE 2 MG/ML IJ SOLN
1.0000 mg | INTRAMUSCULAR | Status: DC | PRN
  Administered 2013-01-08 – 2013-01-09 (×3): 1 mg via INTRAVENOUS
  Filled 2013-01-07 (×3): qty 1

## 2013-01-07 MED ORDER — SODIUM CHLORIDE 0.9 % IJ SOLN
3.0000 mL | Freq: Two times a day (BID) | INTRAMUSCULAR | Status: DC
  Administered 2013-01-08 (×3): 3 mL via INTRAVENOUS

## 2013-01-07 MED ORDER — TEMAZEPAM 15 MG PO CAPS
15.0000 mg | ORAL_CAPSULE | Freq: Every day | ORAL | Status: DC
  Administered 2013-01-08 (×2): 15 mg via ORAL
  Filled 2013-01-07 (×2): qty 1

## 2013-01-07 MED ORDER — ASPIRIN EC 81 MG PO TBEC
81.0000 mg | DELAYED_RELEASE_TABLET | Freq: Every day | ORAL | Status: DC
  Administered 2013-01-08 – 2013-01-09 (×2): 81 mg via ORAL
  Filled 2013-01-07 (×2): qty 1

## 2013-01-07 MED ORDER — EZETIMIBE 10 MG PO TABS
10.0000 mg | ORAL_TABLET | Freq: Every day | ORAL | Status: DC
  Administered 2013-01-08 – 2013-01-09 (×2): 10 mg via ORAL
  Filled 2013-01-07 (×2): qty 1

## 2013-01-07 MED ORDER — NITROGLYCERIN 0.4 MG SL SUBL: 0.4000 mg | SUBLINGUAL_TABLET | SUBLINGUAL | Status: DC | PRN

## 2013-01-07 MED ORDER — PANTOPRAZOLE SODIUM 40 MG PO TBEC
40.0000 mg | DELAYED_RELEASE_TABLET | Freq: Every day | ORAL | Status: DC
  Administered 2013-01-08 – 2013-01-09 (×2): 40 mg via ORAL
  Filled 2013-01-07 (×2): qty 1

## 2013-01-07 MED ORDER — DULOXETINE HCL 60 MG PO CPEP
120.0000 mg | ORAL_CAPSULE | Freq: Every day | ORAL | Status: DC
  Administered 2013-01-08 – 2013-01-09 (×2): 120 mg via ORAL
  Filled 2013-01-07 (×2): qty 2

## 2013-01-07 MED ORDER — PROPRANOLOL HCL 20 MG PO TABS
20.0000 mg | ORAL_TABLET | Freq: Two times a day (BID) | ORAL | Status: DC
  Administered 2013-01-08 – 2013-01-09 (×2): 20 mg via ORAL
  Filled 2013-01-07 (×5): qty 1

## 2013-01-07 MED ORDER — SODIUM CHLORIDE 0.9 % IV SOLN: 250.0000 mL | INTRAVENOUS | Status: DC | PRN

## 2013-01-07 MED ORDER — MORPHINE SULFATE ER 100 MG PO TBCR
100.0000 mg | EXTENDED_RELEASE_TABLET | Freq: Two times a day (BID) | ORAL | Status: DC
  Administered 2013-01-08 – 2013-01-09 (×4): 100 mg via ORAL
  Filled 2013-01-07 (×4): qty 1

## 2013-01-07 NOTE — ED Notes (Signed)
Bladder scan revealed 

## 2013-01-07 NOTE — ED Provider Notes (Signed)
CSN: [id number]     Arrival date & time 01/07/13  1759 History   First MD Initiated Contact with Patient 01/07/13 1844     Chief Complaint  Patient presents with  . Chest Pain   (Consider location/radiation/quality/duration/timing/severity/associated sxs/prior Treatment) HPI Comments: Patient presents with chest pain. He states it started about noon today and was located and left-sided chest. It was nonradiating. He described it as an intense pain. It is not related to movement or breathing. He did have some associated shortness of breath. There is no nausea or vomiting or diaphoresis associated with the pain. He has a history of congestive heart failure but denies any increased weight gain. He does have some increased swelling of his left leg and he had a prior DVT in that leg. He currently is not on blood thinners. He states the chest pain was nonexertional it lasted about 5 hours. It was relieved with nitroglycerin that was given prior to arrival by EMS. He currently denies any chest pain. He denies any history of similar pain in the past. He also has some pain and bruising over his right knee where he had a recent minor fall. He denies hitting his head during the fall. He has an abrasion on his left arm but denies any other injuries. He does say that he's had a hard time urinating over the last few days. He's worried that he may have a urinary tract infection.  Patient is a 62 y.o. male presenting with chest pain.  Chest Pain Associated symptoms: back pain (chronic), fatigue and shortness of breath   Associated symptoms: no abdominal pain, no cough, no diaphoresis, no dizziness, no fever, no headache, no nausea, no numbness, not vomiting and no weakness     Past Medical History  Diagnosis Date  . Hyperlipemia   . Chronic back pain   . GERD (gastroesophageal reflux disease)   . Anxiety   . Chronic narcotic dependence   . Hypothyroidism   . Depression   . Myoclonus   . UTI (lower urinary  tract infection) 5/14    was confused and was found in [place].  Marland Kitchen Acute encephalopathy 09/06/12    was admitted to [facility]  . Foot drop, bilateral   . Shortness of breath     with exertion  . CHF (congestive heart failure)     diurects stopped in April  . DVT (deep venous thrombosis)     01/2012  . Seizures     Myoclonus  . Neuropathy     legs  . Arthritis   . Memory loss of unknown cause   . AVN (avascular necrosis of bone)     right   Past Surgical History  Procedure Laterality Date  . Back surgery      4 lumbar  . Hip surgery Left   . Cervical fusion    . Knee arthroscopy Right   . Joint replacement    . Pain pump implantation    . Tooth extraction N/A 11/16/2012    Procedure: DENTAL EXTRACTIONS OF MULTIPLE NONRESTORABLE TEETH;  Surgeon: Hinton Dyer, DDS;  Location: [facility] OR;  Service: Oral Surgery;  Laterality: N/A;  #12, 14, 15, 22, 23, 24, 25, 26, 27, 28   Family History  Problem Relation Age of Onset  . CAD Father    History  Substance Use Topics  . Smoking status: Never Smoker   . Smokeless tobacco: Never Used  . Alcohol Use: No    Review of Systems  Constitutional: Positive for fatigue. Negative for fever, chills and diaphoresis.  HENT: Negative for congestion, rhinorrhea and sneezing.   Eyes: Negative.   Respiratory: Positive for shortness of breath. Negative for cough and chest tightness.   Cardiovascular: Positive for chest pain and leg swelling.  Gastrointestinal: Negative for nausea, vomiting, abdominal pain, diarrhea and blood in stool.  Genitourinary: Positive for dysuria. Negative for frequency, hematuria, flank pain and difficulty urinating.  Musculoskeletal: Positive for back pain (chronic) and arthralgias.  Skin: Positive for wound (Abrasions). Negative for rash.  Neurological: Negative for dizziness, speech difficulty, weakness, numbness and headaches.    Allergies  Ceftin and Statins  Home Medications   Current Outpatient Rx  Name  Route  Sig   Dispense  Refill  . carisoprodol (SOMA) 350 MG tablet   Oral   Take 350 mg by mouth 3 (three) times daily as needed for muscle spasms.          . diazepam (VALIUM) 2 MG tablet   Oral   Take 2 mg by mouth every 6 (six) hours as needed for anxiety.         . DULoxetine (CYMBALTA) 60 MG capsule   Oral   Take 120 mg by mouth daily.         . Eszopiclone (ESZOPICLONE) 3 MG TABS   Oral   Take 3 mg by mouth at bedtime. Take immediately before bedtime         . ezetimibe (ZETIA) 10 MG tablet   Oral   Take 10 mg by mouth daily.         . Multiple Vitamin (MULTIVITAMIN WITH MINERALS) TABS tablet   Oral   Take 1 tablet by mouth daily.         Marland Kitchen omeprazole (PRILOSEC) 20 MG capsule   Oral   Take 20 mg by mouth daily.         . propranolol (INDERAL) 20 MG tablet   Oral   Take 20 mg by mouth 2 (two) times daily.         . temazepam (RESTORIL) 15 MG capsule   Oral   Take 15 mg by mouth at bedtime.         Marland Kitchen morphine (MS CONTIN) 100 MG 12 hr tablet   Oral   Take 100 mg by mouth 2 (two) times daily.         Marland Kitchen oxymorphone (OPANA ER) 10 MG 12 hr tablet   Oral   Take 10 mg by mouth 3 (three) times daily.          BP 136/81  Temp(Src) 98.2 F (36.8 C) (Oral)  Resp 15  SpO2 92% Physical Exam  Constitutional: He is oriented to person, place, and time. He appears well-developed and well-nourished.  HENT:  Head: Normocephalic and atraumatic.  Mouth/Throat: Oropharynx is clear and moist.  Eyes: Pupils are equal, round, and reactive to light.  Neck: Normal range of motion. Neck supple.  Helping the cervical spine. He has some chronic tenderness to his lumbar spine  Cardiovascular: Normal rate, regular rhythm and normal heart sounds.   Pulmonary/Chest: Effort normal and breath sounds normal. No respiratory distress. He has no wheezes. He has no rales. He exhibits no tenderness.  Abdominal: Soft. Bowel sounds are normal. There is no tenderness. There is no rebound  and no guarding.  Musculoskeletal: Normal range of motion. He exhibits edema.  Patient is a mild increased edema to his left lower leg as compared to  the right. There is no specific calf tenderness. There is no erythema or signs of infection. He does have some ecchymosis and tenderness around the right knee. The ligaments are grossly stable on exam. He has some abrasions on his right forearm but there is no other pain on palpation or range of motion extremities  Lymphadenopathy:    He has no cervical adenopathy.  Neurological: He is alert and oriented to person, place, and time.  Skin: Skin is warm and dry. No rash noted.  Psychiatric: He has a normal mood and affect.    ED Course  Procedures (including critical care time) Labs Review Results for orders placed during the hospital encounter of 01/07/13  BASIC METABOLIC PANEL      Result Value Range   Sodium 140  135 - 145 mEq/L   Potassium 3.9  3.5 - 5.1 mEq/L   Chloride 104  96 - 112 mEq/L   CO2 24  19 - 32 mEq/L   Glucose, Bld 116 (*) 70 - 99 mg/dL   BUN 7  6 - 23 mg/dL   Creatinine, Ser 1.61  0.50 - 1.35 mg/dL   Calcium 9.0  8.4 - 09.6 mg/dL   GFR calc non Af Amer >90  >90 mL/min   GFR calc Af Amer >90  >90 mL/min  CBC WITH DIFFERENTIAL      Result Value Range   WBC 14.3 (*) 4.0 - 10.5 K/uL   RBC 4.89  4.22 - 5.81 MIL/uL   Hemoglobin 14.1  13.0 - 17.0 g/dL   HCT 04.5  40.9 - 81.1 %   MCV 84.9  78.0 - 100.0 fL   MCH 28.8  26.0 - 34.0 pg   MCHC 34.0  30.0 - 36.0 g/dL   RDW 91.4  78.2 - 95.6 %   Platelets 223  150 - 400 K/uL   Neutrophils Relative % 69  43 - 77 %   Neutro Abs 9.9 (*) 1.7 - 7.7 K/uL   Lymphocytes Relative 24  12 - 46 %   Lymphs Abs 3.4  0.7 - 4.0 K/uL   Monocytes Relative 7  3 - 12 %   Monocytes Absolute 1.0  0.1 - 1.0 K/uL   Eosinophils Relative 0  0 - 5 %   Eosinophils Absolute 0.0  0.0 - 0.7 K/uL   Basophils Relative 0  0 - 1 %   Basophils Absolute 0.0  0.0 - 0.1 K/uL  URINALYSIS, ROUTINE W REFLEX  MICROSCOPIC      Result Value Range   Color, Urine YELLOW  YELLOW   APPearance CLEAR  CLEAR   Specific Gravity, Urine 1.010  1.005 - 1.030   pH 7.0  5.0 - 8.0   Glucose, UA NEGATIVE  NEGATIVE mg/dL   Hgb urine dipstick NEGATIVE  NEGATIVE   Bilirubin Urine NEGATIVE  NEGATIVE   Ketones, ur NEGATIVE  NEGATIVE mg/dL   Protein, ur NEGATIVE  NEGATIVE mg/dL   Urobilinogen, UA 0.2  0.0 - 1.0 mg/dL   Nitrite NEGATIVE  NEGATIVE   Leukocytes, UA NEGATIVE  NEGATIVE  POCT I-STAT TROPONIN I      Result Value Range   Troponin i, poc 0.03  0.00 - 0.08 ng/mL   Comment 3            Dg Chest 2 View  01/07/2013   *RADIOLOGY REPORT*  Clinical Data: Chest pain and short of breath  CHEST - 2 VIEW  Comparison: 09/06/2012  Findings: Prominent heart size.  Negative for heart failure.  Lungs are free of infiltrate effusion or mass lesion.  IMPRESSION: No active cardiopulmonary abnormality.   Original Report Authenticated By: Janeece Riggers, M.D.     Imaging Review Dg Chest 2 View  01/07/2013   *RADIOLOGY REPORT*  Clinical Data: Chest pain and short of breath  CHEST - 2 VIEW  Comparison: 09/06/2012  Findings: Prominent heart size.  Negative for heart failure.  Lungs are free of infiltrate effusion or mass lesion.  IMPRESSION: No active cardiopulmonary abnormality.   Original Report Authenticated By: Janeece Riggers, M.D.   Dg Knee Complete 4 Views Right  01/07/2013   *RADIOLOGY REPORT*  Clinical Data: Fall, knee pain  RIGHT KNEE - COMPLETE 4+ VIEW  Comparison: None.  Findings: There is moderate narrowing with osteophyte formation of the medial and lateral compartments.  There is no fracture or dislocation.  There is similar patellofemoral arthritis.  No significant joint effusion.  IMPRESSION: Degenerative changes.  No acute findings.   Original Report Authenticated By: Esperanza Heir, M.D.    MDM  No diagnosis found. Patient presents with chest pain, it was relieved with nitroglycerin. He has no ongoing hypoxia or  pleuritic-type chest pain or tachycardia which would be suggestive of pulmonary embolus. His chest x-ray did not show any signs of pneumonia or heart failure. He does have some leg edema as compared to her baseline and I did order a Doppler ultrasound to rule out DVT however this cannot be performed until the morning. Given his underlying health issues I contacted the hospitalist who will a bit the patient for further cardiac evaluation.    Rolan Bucco, MD 01/07/13 2325

## 2013-01-07 NOTE — ED Notes (Signed)
Pt from home. Lives with wife. Onset CP roughly 3 hrs ago, left sided, no radiation. Worse with palpation.  Relieved with sl ntg x3. Pain 3/10 at this time. 324 ASA given. Newly dx CHF. Hx back problems and surgeries. cbg 143. Takes morphine for back pain and has been out for 2 days.

## 2013-01-07 NOTE — ED Provider Notes (Signed)
Date: 01/07/2013  Rate: 56  Rhythm: normal sinus rhythm  QRS Axis: normal  Intervals: normal  ST/T Wave abnormalities: normal  Conduction Disutrbances:none  Narrative Interpretation:   Old EKG Reviewed: unchanged    Rolan Bucco, MD 01/07/13 2333

## 2013-01-07 NOTE — ED Notes (Signed)
MD at bedside. 

## 2013-01-07 NOTE — H&P (Signed)
[facility] History and Physical  Alex Mcintosh RUE:[id number] DOB: [birth year] DOA: 01/07/2013  Referring physician: Dr. Fredderick Phenix PCP: Berenda Morale, MD  Specialists: none  Chief Complaint: chest discomfort  HPI: Alex Mcintosh is a 62 y.o. male  With history of LLE DVT on coumadin, chronic back pain with limited mobility and sedentary lifestyle and family history of cardiac dx that presented to the ED with one day complaint of left sided non radiating chest discomfort.  The pain came on insidiously while patient was sitting down.  Described as an "intense pain" that persisted until patient was given nitroglycerin in the ambulance.  He is currently chest pain free and has been.  Nothing the patient is aware of made the pain worse.  He denies any coughing, nausea, diaphoresis, or sick contacts.  In the ED troponin was negative and EKG was unremarkable.  We were consulted for admission evaluation and recommendations for ACS rule out.  Review of Systems: 10 point review of system reviewed with patient and negative unless otherwise mentioned above.  Past Medical History  Diagnosis Date  . Hyperlipemia   . Chronic back pain   . GERD (gastroesophageal reflux disease)   . Anxiety   . Chronic narcotic dependence   . Hypothyroidism   . Depression   . Myoclonus   . UTI (lower urinary tract infection) 5/14    was confused and was found in [place].  Marland Kitchen Acute encephalopathy 09/06/12    was admitted to [facility]  . Foot drop, bilateral   . Shortness of breath     with exertion  . CHF (congestive heart failure)     diurects stopped in April  . DVT (deep venous thrombosis)     01/2012  . Seizures     Myoclonus  . Neuropathy     legs  . Arthritis   . Memory loss of unknown cause   . AVN (avascular necrosis of bone)     right   Past Surgical History  Procedure Laterality Date  . Back surgery      4 lumbar  . Hip surgery Left   . Cervical fusion    . Knee arthroscopy Right   . Joint replacement     . Pain pump implantation    . Tooth extraction N/A 11/16/2012    Procedure: DENTAL EXTRACTIONS OF MULTIPLE NONRESTORABLE TEETH;  Surgeon: Hinton Dyer, DDS;  Location: [facility] OR;  Service: Oral Surgery;  Laterality: N/A;  #12, 14, 15, 22, 23, 24, 25, 26, 27, 28   Social History:  reports that he has never smoked. He has never used smokeless tobacco. He reports that he does not drink alcohol or use illicit drugs.  where does patient live--home, ALF, SNF? and with whom if at home? Lives at home with wife  Can patient participate in ADLs? limited  Allergies  Allergen Reactions  . Ceftin [Cefuroxime Axetil] Hives  . Statins Other (See Comments)    Messes with liver enzymes    Family History  Problem Relation Age of Onset  . CAD Father    Pt reports family hx of cardiac disease   Prior to Admission medications   Medication Sig Start Date End Date Taking? Authorizing Provider  carisoprodol (SOMA) 350 MG tablet Take 350 mg by mouth 3 (three) times daily as needed for muscle spasms.     Historical Provider, MD  diazepam (VALIUM) 2 MG tablet Take 2 mg by mouth every 6 (six) hours as needed for anxiety.  Historical Provider, MD  DULoxetine (CYMBALTA) 60 MG capsule Take 120 mg by mouth daily.    Historical Provider, MD  Eszopiclone (ESZOPICLONE) 3 MG TABS Take 3 mg by mouth at bedtime. Take immediately before bedtime    Historical Provider, MD  ezetimibe (ZETIA) 10 MG tablet Take 10 mg by mouth daily.    Historical Provider, MD  morphine (MS CONTIN) 100 MG 12 hr tablet Take 100 mg by mouth 2 (two) times daily.    Historical Provider, MD  Multiple Vitamin (MULTIVITAMIN WITH MINERALS) TABS tablet Take 1 tablet by mouth daily.    Historical Provider, MD  omeprazole (PRILOSEC) 20 MG capsule Take 20 mg by mouth daily.    Historical Provider, MD  oxymorphone (OPANA ER) 10 MG 12 hr tablet Take 10 mg by mouth 3 (three) times daily.    Historical Provider, MD  propranolol (INDERAL) 20 MG tablet Take 20  mg by mouth 2 (two) times daily.    Historical Provider, MD  temazepam (RESTORIL) 15 MG capsule Take 15 mg by mouth at bedtime.    Historical Provider, MD   Physical Exam: There were no vitals filed for this visit.   General:  Pt in NAD, smiling at times   Eyes: EOMI, non icteric  ENT: normal exterior appearance, MMM  Neck: supple, no goiter  Cardiovascular: RRR, no MRG  Respiratory: CTA BL, no wheezes  Abdomen: soft, NT, ND  Skin: warm and dry  Musculoskeletal: no cyanosis or clubbing. Left leg is more edematous than right with some calf tenderness  Psychiatric: mood and affect appropriate  Neurologic: answers questions appropriately, moves extremities equally  Labs on Admission:  Basic Metabolic Panel:  Recent Labs Lab 01/07/13 1831  NA 140  K 3.9  CL 104  CO2 24  GLUCOSE 116*  BUN 7  CREATININE 0.67  CALCIUM 9.0   Liver Function Tests: No results found for this basename: AST, ALT, ALKPHOS, BILITOT, PROT, ALBUMIN,  in the last 168 hours No results found for this basename: LIPASE, AMYLASE,  in the last 168 hours No results found for this basename: AMMONIA,  in the last 168 hours CBC:  Recent Labs Lab 01/07/13 1831  WBC 14.3*  NEUTROABS 9.9*  HGB 14.1  HCT 41.5  MCV 84.9  PLT 223   Cardiac Enzymes: No results found for this basename: CKTOTAL, CKMB, CKMBINDEX, TROPONINI,  in the last 168 hours  BNP (last 3 results)  Recent Labs  06/13/12 1550  PROBNP 120.4   CBG: No results found for this basename: GLUCAP,  in the last 168 hours  Radiological Exams on Admission: Dg Chest 2 View  01/07/2013   *RADIOLOGY REPORT*  Clinical Data: Chest pain and short of breath  CHEST - 2 VIEW  Comparison: 09/06/2012  Findings: Prominent heart size.  Negative for heart failure.  Lungs are free of infiltrate effusion or mass lesion.  IMPRESSION: No active cardiopulmonary abnormality.   Original Report Authenticated By: Janeece Riggers, M.D.   Dg Knee Complete 4 Views  Right  01/07/2013   *RADIOLOGY REPORT*  Clinical Data: Fall, knee pain  RIGHT KNEE - COMPLETE 4+ VIEW  Comparison: None.  Findings: There is moderate narrowing with osteophyte formation of the medial and lateral compartments.  There is no fracture or dislocation.  There is similar patellofemoral arthritis.  No significant joint effusion.  IMPRESSION: Degenerative changes.  No acute findings.   Original Report Authenticated By: Esperanza Heir, M.D.    EKG: Independently reviewed. Normal sinus rhythm  with no ST elevations or depressions.  Assessment/Plan Active Problems:   1. Chest discomfort - telemetry monitoring - Troponin q 6 hours - echocardiogram for wall motion abnormality - aspirin 81 mg po daily - Nitro and morphine prn chest discomfort  2. Anxiety - continue home regimen, stable currently  3. History of DVT - continue coumadin per pharmacy consult - Patient has not had any complaints of SOB, cough, increased work of breathing. No tachypnea so therefore will not obtain a CT angiogram of chest.  4. Hyperlipidemia -stable will continue Zetia   Code Status: full Family Communication: discussed with patient and wife  Disposition Plan:  Pending further work up.  Time spent: > 60 minutes  Penny Pia [facility] Pager [phone]  If 7PM-7AM, please contact night-coverage [email or web address] Password [facility] 01/07/2013, 11:17 PM

## 2013-01-07 NOTE — ED Notes (Signed)
Patient transported to CT 

## 2013-01-08 ENCOUNTER — Encounter (HOSPITAL_COMMUNITY): Payer: Self-pay | Admitting: *Deleted

## 2013-01-08 DIAGNOSIS — G8929 Other chronic pain: Secondary | ICD-10-CM

## 2013-01-08 DIAGNOSIS — R791 Abnormal coagulation profile: Secondary | ICD-10-CM

## 2013-01-08 LAB — CBC
HCT: 37.9 % — ABNORMAL LOW (ref 39.0–52.0)
MCH: 28.9 pg (ref 26.0–34.0)
MCV: 85 fL (ref 78.0–100.0)
RDW: 13.5 % (ref 11.5–15.5)
WBC: 10.1 10*3/uL (ref 4.0–10.5)

## 2013-01-08 LAB — PROTIME-INR
INR: 7.16 (ref 0.00–1.49)
INR: 6.63 (ref 0.00–1.49)
Prothrombin Time: 58.4 seconds — ABNORMAL HIGH (ref 11.6–15.2)
Prothrombin Time: 55.1 seconds — ABNORMAL HIGH (ref 11.6–15.2)
Prothrombin Time: 52 seconds — ABNORMAL HIGH (ref 11.6–15.2)

## 2013-01-08 LAB — BASIC METABOLIC PANEL
BUN: 8 mg/dL (ref 6–23)
CO2: 27 mEq/L (ref 19–32)
Chloride: 106 mEq/L (ref 96–112)
Creatinine, Ser: 0.68 mg/dL (ref 0.50–1.35)
Glucose, Bld: 114 mg/dL — ABNORMAL HIGH (ref 70–99)

## 2013-01-08 MED ORDER — WARFARIN - PHARMACIST DOSING INPATIENT: Freq: Every day | Status: DC

## 2013-01-08 MED ORDER — PHYTONADIONE 5 MG PO TABS
2.5000 mg | ORAL_TABLET | Freq: Once | ORAL | Status: AC
  Administered 2013-01-08: 2.5 mg via ORAL
  Filled 2013-01-08: qty 1

## 2013-01-08 MED ORDER — PNEUMOCOCCAL VAC POLYVALENT 25 MCG/0.5ML IJ INJ
0.5000 mL | INJECTION | INTRAMUSCULAR | Status: AC
  Administered 2013-01-09: 0.5 mL via INTRAMUSCULAR
  Filled 2013-01-08: qty 0.5

## 2013-01-08 NOTE — Progress Notes (Signed)
*  PRELIMINARY RESULTS* Echocardiogram 2D Echocardiogram has been performed.  Alex Mcintosh 01/08/2013, 10:53 AM

## 2013-01-08 NOTE — Progress Notes (Signed)
PT:55.1, INR:6.6, MD aware and will order Vitamin K, pt also wanted to discuss his code status, pt states that he has a DNR gold sheet at home and would to be a DNR, will continue to monitor, Thanks, Lavonda Jumbo RN.

## 2013-01-08 NOTE — Progress Notes (Addendum)
Benny Lennert Rn agree w/ Timoteo Ace RN assessment   Suction set up in room d/t hx of SZ

## 2013-01-08 NOTE — Progress Notes (Signed)
CRITICAL VALUE ALERT  Critical value received:  INR 6.15  Date of notification:  8-1  Time of notification:  8:15   Critical value read back:yes  Nurse who received alert: Ninetta Lights   MD notified (1st page):  MD [facility] 9 DR Z  Time of first page:  8:20  MD notified (2nd page):  Time of second page:  Responding MD:    Time MD responded:

## 2013-01-08 NOTE — Progress Notes (Addendum)
ANTICOAGULATION CONSULT NOTE - Follow Up Consult  Pharmacy Consult for Coumadin Indication: hx DVT (01/2012)  Allergies  Allergen Reactions  . Ceftin [Cefuroxime Axetil] Hives  . Statins Other (See Comments)    Messes with liver enzymes    Patient Measurements: Height: 5\' 10"  (177.8 cm) Weight: 259 lb 14.4 oz (117.89 kg) IBW/kg (Calculated) : 73  Vital Signs: Temp: 98.1 F (36.7 C) (09/01 1016) Temp src: Oral (09/01 1016) BP: 153/79 mmHg (09/01 1143) Pulse Rate: 67 (09/01 1143)  Labs:  Recent Labs  01/07/13 1831 01/08/13 0125 01/08/13 0430 01/08/13 0715  HGB 14.1  --   --  12.9*  HCT 41.5  --   --  37.9*  PLT 223  --   --  186  LABPROT  --   --  55.1* 52.0*  INR  --   --  6.63* 6.15*  CREATININE 0.67  --   --  0.68  TROPONINI  --  <0.30  --  <0.30    Estimated Creatinine Clearance: 128 ml/min (by C-G formula based on Cr of 0.68).  Assessment:   INR 6.63 on admit overnight.  Vitamin K 2.5 mg PO given ~6am.  Coumadin was not on initial home med list; wife relates that she forgot to mention at that time. Today, she reports current regimen of 5 mg every other day.  She related multiple dosing changes, from 5 mg daily, to 2.5 mg daily, then to 5 mg every other day.  She also reports no recent INR checks, due to former PCP, Dr. Larwance Sachs,  leaving the practice, and appointment with new PCP, Dr. Sigmund Hazel, may be in about 2 weeks.  He also took Xarelto briefly in July, when off Coumadin for oral surgery.   No bleeding.  Patient and wife reported a fall at home on Saturday night (8/30) when up to the bathroom, but he did not hit his head.  Minor bruising on right arm, scrape on left arm.  Goal of Therapy:  INR 2-3 Monitor platelets by anticoagulation protocol: Yes   Plan:   No Coumadin today.  Daily PT/INR.  Dennie Fetters, [place] Pager: [phone] 01/08/2013,12:52 PM

## 2013-01-08 NOTE — Progress Notes (Signed)
ANTICOAGULATION CONSULT NOTE - Initial Consult  Pharmacy Consult for Coumadin Indication: h/o DVT  Allergies  Allergen Reactions  . Ceftin [Cefuroxime Axetil] Hives  . Statins Other (See Comments)    Messes with liver enzymes    Patient Measurements: Height: 5\' 10"  (177.8 cm) Weight: 259 lb (117.482 kg) IBW/kg (Calculated) : 73  Vital Signs: Temp: 97.2 F (36.2 C) (08/31 2348) Temp src: Oral (08/31 2348) BP: 142/78 mmHg (08/31 2348) Pulse Rate: 57 (08/31 2348)  Labs:  Recent Labs  01/07/13 1831  HGB 14.1  HCT 41.5  PLT 223  CREATININE 0.67    Estimated Creatinine Clearance: 127.7 ml/min (by C-G formula based on Cr of 0.67).   Medical History: Past Medical History  Diagnosis Date  . Hyperlipemia   . Chronic back pain   . GERD (gastroesophageal reflux disease)   . Anxiety   . Chronic narcotic dependence   . Hypothyroidism   . Depression   . Myoclonus   . UTI (lower urinary tract infection) 5/14    was confused and was found in [place].  Marland Kitchen Acute encephalopathy 09/06/12    was admitted to [facility]  . Foot drop, bilateral   . Shortness of breath     with exertion  . CHF (congestive heart failure)     diurects stopped in April  . DVT (deep venous thrombosis)     01/2012  . Seizures     Myoclonus  . Neuropathy     legs  . Arthritis   . Memory loss of unknown cause   . AVN (avascular necrosis of bone)     right    Medications:  Prescriptions prior to admission  Medication Sig Dispense Refill  . carisoprodol (SOMA) 350 MG tablet Take 350 mg by mouth 3 (three) times daily as needed for muscle spasms.       . diazepam (VALIUM) 2 MG tablet Take 2 mg by mouth every 6 (six) hours as needed for anxiety.      . DULoxetine (CYMBALTA) 60 MG capsule Take 120 mg by mouth daily.      . Eszopiclone (ESZOPICLONE) 3 MG TABS Take 3 mg by mouth at bedtime. Take immediately before bedtime      . ezetimibe (ZETIA) 10 MG tablet Take 10 mg by mouth daily.      Marland Kitchen morphine (MS  CONTIN) 100 MG 12 hr tablet Take 100 mg by mouth 2 (two) times daily.      . Multiple Vitamin (MULTIVITAMIN WITH MINERALS) TABS tablet Take 1 tablet by mouth daily.      Marland Kitchen omeprazole (PRILOSEC) 20 MG capsule Take 20 mg by mouth daily.      Marland Kitchen oxymorphone (OPANA ER) 10 MG 12 hr tablet Take 10 mg by mouth 3 (three) times daily.      . propranolol (INDERAL) 20 MG tablet Take 20 mg by mouth 2 (two) times daily.      . temazepam (RESTORIL) 15 MG capsule Take 15 mg by mouth at bedtime.       Scheduled:  . aspirin EC  81 mg Oral Daily  . DULoxetine  120 mg Oral Daily  . ezetimibe  10 mg Oral Daily  . morphine  100 mg Oral BID  . pantoprazole  40 mg Oral Daily  . [START ON 01/09/2013] pneumococcal 23 valent vaccine  0.5 mL Intramuscular Tomorrow-1000  . propranolol  20 mg Oral BID  . sodium chloride  3 mL Intravenous Q12H  . sodium chloride  3 mL Intravenous Q12H  . temazepam  15 mg Oral QHS     Assessment: 62yo male c/o non-radiating CP while at rest, to continue Coumadin for DVT during evaluation for possible ACS.  Goal of Therapy:  INR 2-3   Plan:  Will obtain current INR prior to dosing.  Vernard Gambles, PharmD, BCPS  01/08/2013,1:14 AM

## 2013-01-08 NOTE — Progress Notes (Signed)
Patient stated that he was unable to urinate.  Bladder scan revealed urine.  Straight cath yielded output of clear yellow urine.  Patient resting comfortably in bed, call bell and phone within reach.

## 2013-01-08 NOTE — Progress Notes (Signed)
[facility] PROGRESS NOTE  Alex Mcintosh YNW:[id number] DOB: Sep 30, 1950 DOA: 01/07/2013 PCP: Berenda Morale, MD  Assessment/Plan:  Chest pain Atypical, reproducible to palpation. He states have a fall several days ago and thinks he may have pulled a muscle 2 left thoracic wall. He has had 3 negative sets of cardiac enzymes, currently awaiting a transthoracic echocardiogram. During my encounter, he appears comfortable in no acute distress.  Supratherapeutic INR He is on anticoagulation or history of DVT. Presented with an INR of 6.63, administer vitamin K 2.5 mg orally, with recheck INR at 6.15. He presently does not show signs or symptoms of bleeding. Hemoglobin at 12.9 currently. Plan followup on a.m. PT/INR. Meanwhile his Coumadin is currently being held. Pharmacy was consulted for dosing.  History of DVT. Presently anticoagulated, stable.   Status post fall. Patient having a fall 2 days ago, appearing to be mechanical. He denied symptoms prior to fall. 2 view of the knees showing no acute bony abnormalities. It's possible he may have pulled a muscle on thoracic wall.    Urinary retention Patient reporting to me that each time he is admitted to the hospital he has issues with urinary retention. Oftentimes this leads to the need for in and out cath. He denies having these issues at home. I instructed nursing to perform a 3 times daily.     Code Status: Full code Family Communication: Plan discussed with patient and wife present at bedside Disposition Plan: Followup on transthoracic echocardiogram as well as repeat INR. Anticipate discharge in the next 24 hour  HPI/Subjective: Alex Mcintosh is a pleasant 62 year old gentleman with a history left lower extremity DVT, presently on anticoagulation who was admitted overnight with complaints of left-sided chest pain. He reports having a fall 2 nights ago injuring his right knee. He denied chest pain shortness of breath prior to this fall.  Patient reporting back to lifting his left arm precipitates pain. His chest pain is characterized as sharp stabbing and it was reproducible to palpation on my exam today. Cardiac enzymes have been negative. Currently pending is a transthoracic echocardiogram. Other issues, patient having a supratherapeutic INR which he was given 2.5 mg of vitamin K.  Objective: Filed Vitals:   01/08/13 1143  BP: 153/79  Pulse: 67  Temp:   Resp:     Intake/Output Summary (Last 24 hours) at 01/08/13 1306 Last data filed at 01/08/13 1200  Gross per 24 hour  Intake    483 ml  Output    875 ml  Net   -392 ml   Filed Weights   01/07/13 2348 01/08/13 0700  Weight: 117.482 kg (259 lb) 117.89 kg (259 lb 14.4 oz)    Exam:   General:  Patient is in no acute distress he is sitting up having his lunch, Cooperative and pleasant  Cardiovascular:  Regular rate and rhythm normal S1-S2 no murmurs or gallops  Respiratory: Lungs patient had a normal respiratory effort no wheezing rhonchi while  Abdomen: Abdomen is soft nontender nondistended positive bowel sounds  Musculoskeletal: Patient having recent reproducible pain to palpation across the left side of his chest. He describes his pain as sharp and stabbing.  Extremities: No edema   Data Reviewed: Basic Metabolic Panel:  Recent Labs Lab 01/07/13 1831 01/08/13 0125 01/08/13 0715  NA 140  --  142  K 3.9  --  3.4*  CL 104  --  106  CO2 24  --  27  GLUCOSE 116*  --  114*  BUN  7  --  8  CREATININE 0.67  --  0.68  CALCIUM 9.0  --  8.9  MG  --  2.0  --   PHOS  --  3.7  --    Liver Function Tests: No results found for this basename: AST, ALT, ALKPHOS, BILITOT, PROT, ALBUMIN,  in the last 168 hours No results found for this basename: LIPASE, AMYLASE,  in the last 168 hours No results found for this basename: AMMONIA,  in the last 168 hours CBC:  Recent Labs Lab 01/07/13 1831 01/08/13 0715  WBC 14.3* 10.1  NEUTROABS 9.9*  --   HGB 14.1 12.9*   HCT 41.5 37.9*  MCV 84.9 85.0  PLT 223 186   Cardiac Enzymes:  Recent Labs Lab 01/08/13 0125 01/08/13 0715  TROPONINI <0.30 <0.30   BNP (last 3 results)  Recent Labs  06/13/12 1550  PROBNP 120.4   CBG: No results found for this basename: GLUCAP,  in the last 168 hours  No results found for this or any previous visit (from the past 240 hour(s)).   Studies: Dg Chest 2 View  01/07/2013   *RADIOLOGY REPORT*  Clinical Data: Chest pain and short of breath  CHEST - 2 VIEW  Comparison: 09/06/2012  Findings: Prominent heart size.  Negative for heart failure.  Lungs are free of infiltrate effusion or mass lesion.  IMPRESSION: No active cardiopulmonary abnormality.   Original Report Authenticated By: Janeece Riggers, M.D.   Dg Knee Complete 4 Views Right  01/07/2013   *RADIOLOGY REPORT*  Clinical Data: Fall, knee pain  RIGHT KNEE - COMPLETE 4+ VIEW  Comparison: None.  Findings: There is moderate narrowing with osteophyte formation of the medial and lateral compartments.  There is no fracture or dislocation.  There is similar patellofemoral arthritis.  No significant joint effusion.  IMPRESSION: Degenerative changes.  No acute findings.   Original Report Authenticated By: Esperanza Heir, M.D.    Scheduled Meds: . aspirin EC  81 mg Oral Daily  . DULoxetine  120 mg Oral Daily  . ezetimibe  10 mg Oral Daily  . morphine  100 mg Oral BID  . pantoprazole  40 mg Oral Daily  . [START ON 01/09/2013] pneumococcal 23 valent vaccine  0.5 mL Intramuscular Tomorrow-1000  . propranolol  20 mg Oral BID  . sodium chloride  3 mL Intravenous Q12H  . sodium chloride  3 mL Intravenous Q12H  . temazepam  15 mg Oral QHS  . Warfarin - Pharmacist Dosing Inpatient   Does not apply q1800   Continuous Infusions:   Active Problems:   Hyperlipidemia   History of DVT of lower extremity   Chronic pain   Chest discomfort    Time spent: 35    Jeralyn Bennett  [facility] Pager [phone]. If  7PM-7AM, please contact night-coverage at [email or web address], password [facility] 01/08/2013, 1:06 PM  LOS: 1 day

## 2013-01-09 LAB — GLUCOSE, CAPILLARY: Glucose-Capillary: 88 mg/dL (ref 70–99)

## 2013-01-09 NOTE — Progress Notes (Signed)
IV d/c'd.  Tele d/c'd.  Pt d/c'd to home.  Home meds and d/c instructions have been discussed with pt.  Pt denies any questions or concerns at this time.  Pt leaving the unit via wheelchair transport and appears in no acute distress. Nino Glow RN

## 2013-01-09 NOTE — Discharge Summary (Signed)
Physician Discharge Summary  Alex Mcintosh WGN:[id number] DOB: [birth year] DOA: 01/07/2013  PCP: Berenda Morale, MD  Admit date: 01/07/2013 Discharge date: 01/09/2013  Time spent: 35 minutes  Recommendations for Outpatient Follow-up:  1. Please followup on PT/INR, I instructed him to present to his primary care physician's office this Thursday, 01/11/2013 for a PT/INR check his INR was 2.7 date of discharge. He initially presented with a supratherapeutic INR at 6.63   Discharge Diagnoses:  Active Problems:   Hyperlipidemia   History of DVT of lower extremity   Chronic pain   Chest discomfort   Discharge Condition: Stable  Diet recommendation: Heart healthy  Filed Weights   01/07/13 2348 01/08/13 0700 01/09/13 0526  Weight: 117.482 kg (259 lb) 117.89 kg (259 lb 14.4 oz) 117.89 kg (259 lb 14.4 oz)    History of present illness:  Alex Mcintosh is a 62 y.o. male  With history of LLE DVT on coumadin, chronic back pain with limited mobility and sedentary lifestyle and family history of cardiac dx that presented to the ED with one day complaint of left sided non radiating chest discomfort. The pain came on insidiously while patient was sitting down. Described as an "intense pain" that persisted until patient was given nitroglycerin in the ambulance. He is currently chest pain free and has been. Nothing the patient is aware of made the pain worse. He denies any coughing, nausea, diaphoresis, or sick contacts.   Hospital Course:  Patient is a pleasant 62 year old gentleman with a history of DVT presently on chronic anticoagulation presenting to the department on 01/07/2013 with complaints of chest pain having atypical features. He had reported having a fall several days ago injuring his right knee. He was placed on 4 East, where cardiac enzymes were cycled and remained negative x3 sets. He had a transthoracic echocardiogram performed on this admission which showed an ejection fraction of 50-55%,  without wall motion abnormalities. On physical exam, his pain was reproducible to palpation aross his left chest. He also reported that symptoms were precipitated by lifting and extending his left arm. I suspect that he injured his left thoracic wall the time of this fall. Other issues during this hospitalization, he was found to have a supratherapeutic INR at 6.63 for which he was administered 2.5 mg of vitamin K. His Coumadin was held as his INR is down to 2.71 on the day of discharge. He did not have evidence of active bleeding. Patient was discharged in stable condition.  Procedures: Transthoracic echocardiogram performed on 01/08/2013 impression: Left ventricular ejection fraction of 50-55%, wall thickness was increased in a pattern of moderate LVH. Was moderate concentric hypertrophy.  Discharge Exam: Filed Vitals:   01/09/13 0526  BP: 106/60  Pulse: 63  Temp: 97.7 F (36.5 C)  Resp: 19    General: Patient is in no acute distress he is sitting up having breakfast. He reports he reports having soreness to thoracic wall. Cardiovascular: Regular rate and rhythm normal S1-S2 no murmurs rubs or gallops Respiratory: Normal respiratory effort lungs are clear to auscultation bilaterally no wheezing rhonchi rales  Abdomen: Obese soft nontender nondistended positive bowel Extremities: Patient reporting some right knee pain, no sinus or edema. There is some bruising over his left upper extremity as well as right lower extremity.   Discharge Instructions  Discharge Orders   Future Orders Complete By Expires   Call MD for:  difficulty breathing, headache or visual disturbances  As directed    Call MD for:  redness,  tenderness, or signs of infection (pain, swelling, redness, odor or green/yellow discharge around incision site)  As directed    Call MD for:  severe uncontrolled pain  As directed    Diet - low sodium heart healthy  As directed    Discharge instructions  As directed    Comments:      Follow up at your doctor's office this Thursday for a PT/INR check   Increase activity slowly  As directed        Medication List         carisoprodol 350 MG tablet  Commonly known as:  SOMA  Take 350 mg by mouth 3 (three) times daily as needed for muscle spasms.     diazepam 2 MG tablet  Commonly known as:  VALIUM  Take 2 mg by mouth every 6 (six) hours as needed for anxiety.     DULoxetine 60 MG capsule  Commonly known as:  CYMBALTA  Take 120 mg by mouth daily.     eszopiclone 3 MG Tabs  Generic drug:  Eszopiclone  Take 3 mg by mouth at bedtime. Take immediately before bedtime     ezetimibe 10 MG tablet  Commonly known as:  ZETIA  Take 10 mg by mouth daily.     morphine 100 MG 12 hr tablet  Commonly known as:  MS CONTIN  Take 100 mg by mouth 2 (two) times daily.     multivitamin with minerals Tabs tablet  Take 1 tablet by mouth daily.     omeprazole 20 MG capsule  Commonly known as:  PRILOSEC  Take 20 mg by mouth daily.     oxymorphone 10 MG 12 hr tablet  Commonly known as:  OPANA ER  Take 10 mg by mouth 3 (three) times daily.     propranolol 20 MG tablet  Commonly known as:  INDERAL  Take 20 mg by mouth 2 (two) times daily.     temazepam 15 MG capsule  Commonly known as:  RESTORIL  Take 15 mg by mouth at bedtime.     warfarin 5 MG tablet  Commonly known as:  COUMADIN  Take 5 mg by mouth every other day.       Allergies  Allergen Reactions  . Ceftin [Cefuroxime Axetil] Hives  . Statins Other (See Comments)    Messes with liver enzymes       Follow-up Information   Follow up with [facility], MD In 2 days. (Please present for a PT/INR check)    Specialty:  Family Medicine   Contact information:   [address] [place] [address] [phone]        The results of significant diagnostics from this hospitalization (including imaging, microbiology, ancillary and laboratory) are listed below for reference.    Significant  Diagnostic Studies: Dg Chest 2 View  01/07/2013   *RADIOLOGY REPORT*  Clinical Data: Chest pain and short of breath  CHEST - 2 VIEW  Comparison: 09/06/2012  Findings: Prominent heart size.  Negative for heart failure.  Lungs are free of infiltrate effusion or mass lesion.  IMPRESSION: No active cardiopulmonary abnormality.   Original Report Authenticated By: Janeece Riggers, M.D.   Dg Knee Complete 4 Views Right  01/07/2013   *RADIOLOGY REPORT*  Clinical Data: Fall, knee pain  RIGHT KNEE - COMPLETE 4+ VIEW  Comparison: None.  Findings: There is moderate narrowing with osteophyte formation of the medial and lateral compartments.  There is no fracture or dislocation.  There  is similar patellofemoral arthritis.  No significant joint effusion.  IMPRESSION: Degenerative changes.  No acute findings.   Original Report Authenticated By: Esperanza Heir, M.D.    Microbiology: No results found for this or any previous visit (from the past 240 hour(s)).   Labs: Basic Metabolic Panel:  Recent Labs Lab 01/07/13 1831 01/08/13 0125 01/08/13 0715  NA 140  --  142  K 3.9  --  3.4*  CL 104  --  106  CO2 24  --  27  GLUCOSE 116*  --  114*  BUN 7  --  8  CREATININE 0.67  --  0.68  CALCIUM 9.0  --  8.9  MG  --  2.0  --   PHOS  --  3.7  --    Liver Function Tests: No results found for this basename: AST, ALT, ALKPHOS, BILITOT, PROT, ALBUMIN,  in the last 168 hours No results found for this basename: LIPASE, AMYLASE,  in the last 168 hours No results found for this basename: AMMONIA,  in the last 168 hours CBC:  Recent Labs Lab 01/07/13 1831 01/08/13 0715  WBC 14.3* 10.1  NEUTROABS 9.9*  --   HGB 14.1 12.9*  HCT 41.5 37.9*  MCV 84.9 85.0  PLT 223 186   Cardiac Enzymes:  Recent Labs Lab 01/08/13 0125 01/08/13 0715 01/08/13 1353  TROPONINI <0.30 <0.30 <0.30   BNP: BNP (last 3 results)  Recent Labs  06/13/12 1550  PROBNP 120.4   CBG:  Recent Labs Lab 01/09/13 0620  GLUCAP 88        Signed:  Abrar Bilton  [facility] 01/09/2013, 8:18 AM

## 2013-02-10 ENCOUNTER — Emergency Department (HOSPITAL_COMMUNITY)
Admission: EM | Admit: 2013-02-10 | Discharge: 2013-02-10 | Disposition: A | Discharge status: Home / Self Care | Payer: Medicare Other | Source: Home / Self Care | Attending: Emergency Medicine | Admitting: Emergency Medicine

## 2013-02-10 ENCOUNTER — Emergency Department (HOSPITAL_COMMUNITY): Payer: Medicare Other

## 2013-02-10 ENCOUNTER — Encounter (HOSPITAL_COMMUNITY): Payer: Self-pay | Admitting: Emergency Medicine

## 2013-02-10 DIAGNOSIS — F411 Generalized anxiety disorder: Secondary | ICD-10-CM | POA: Insufficient documentation

## 2013-02-10 DIAGNOSIS — R197 Diarrhea, unspecified: Secondary | ICD-10-CM

## 2013-02-10 DIAGNOSIS — E039 Hypothyroidism, unspecified: Secondary | ICD-10-CM | POA: Insufficient documentation

## 2013-02-10 DIAGNOSIS — F329 Major depressive disorder, single episode, unspecified: Secondary | ICD-10-CM | POA: Diagnosis present

## 2013-02-10 DIAGNOSIS — I509 Heart failure, unspecified: Secondary | ICD-10-CM | POA: Insufficient documentation

## 2013-02-10 DIAGNOSIS — M79609 Pain in unspecified limb: Secondary | ICD-10-CM | POA: Diagnosis present

## 2013-02-10 DIAGNOSIS — F19939 Other psychoactive substance use, unspecified with withdrawal, unspecified: Secondary | ICD-10-CM | POA: Insufficient documentation

## 2013-02-10 DIAGNOSIS — E876 Hypokalemia: Secondary | ICD-10-CM

## 2013-02-10 DIAGNOSIS — F1320 Sedative, hypnotic or anxiolytic dependence, uncomplicated: Secondary | ICD-10-CM | POA: Diagnosis present

## 2013-02-10 DIAGNOSIS — F3289 Other specified depressive episodes: Secondary | ICD-10-CM | POA: Insufficient documentation

## 2013-02-10 DIAGNOSIS — G8929 Other chronic pain: Secondary | ICD-10-CM | POA: Insufficient documentation

## 2013-02-10 DIAGNOSIS — Z8739 Personal history of other diseases of the musculoskeletal system and connective tissue: Secondary | ICD-10-CM | POA: Insufficient documentation

## 2013-02-10 DIAGNOSIS — Z79899 Other long term (current) drug therapy: Secondary | ICD-10-CM | POA: Insufficient documentation

## 2013-02-10 DIAGNOSIS — M216X9 Other acquired deformities of unspecified foot: Secondary | ICD-10-CM | POA: Diagnosis present

## 2013-02-10 DIAGNOSIS — Z86718 Personal history of other venous thrombosis and embolism: Secondary | ICD-10-CM | POA: Insufficient documentation

## 2013-02-10 DIAGNOSIS — F1193 Opioid use, unspecified with withdrawal: Secondary | ICD-10-CM

## 2013-02-10 DIAGNOSIS — F1123 Opioid dependence with withdrawal: Secondary | ICD-10-CM

## 2013-02-10 DIAGNOSIS — G253 Myoclonus: Secondary | ICD-10-CM | POA: Diagnosis present

## 2013-02-10 DIAGNOSIS — M549 Dorsalgia, unspecified: Secondary | ICD-10-CM | POA: Diagnosis present

## 2013-02-10 DIAGNOSIS — K219 Gastro-esophageal reflux disease without esophagitis: Secondary | ICD-10-CM | POA: Insufficient documentation

## 2013-02-10 DIAGNOSIS — R569 Unspecified convulsions: Secondary | ICD-10-CM | POA: Diagnosis present

## 2013-02-10 DIAGNOSIS — F0390 Unspecified dementia without behavioral disturbance: Secondary | ICD-10-CM | POA: Insufficient documentation

## 2013-02-10 DIAGNOSIS — F1120 Opioid dependence, uncomplicated: Secondary | ICD-10-CM | POA: Diagnosis present

## 2013-02-10 DIAGNOSIS — E785 Hyperlipidemia, unspecified: Secondary | ICD-10-CM | POA: Insufficient documentation

## 2013-02-10 DIAGNOSIS — B957 Other staphylococcus as the cause of diseases classified elsewhere: Secondary | ICD-10-CM | POA: Diagnosis present

## 2013-02-10 DIAGNOSIS — T40605A Adverse effect of unspecified narcotics, initial encounter: Secondary | ICD-10-CM | POA: Diagnosis present

## 2013-02-10 DIAGNOSIS — Z8669 Personal history of other diseases of the nervous system and sense organs: Secondary | ICD-10-CM | POA: Insufficient documentation

## 2013-02-10 DIAGNOSIS — Z87440 Personal history of urinary (tract) infections: Secondary | ICD-10-CM | POA: Insufficient documentation

## 2013-02-10 LAB — COMPREHENSIVE METABOLIC PANEL
ALT: 17 U/L (ref 0–53)
Albumin: 4 g/dL (ref 3.5–5.2)
Alkaline Phosphatase: 117 U/L (ref 39–117)
Chloride: 103 mEq/L (ref 96–112)
GFR calc Af Amer: >90 mL/min (ref >90)
Glucose, Bld: 115 mg/dL — ABNORMAL HIGH (ref 70–99)
Potassium: 2.9 mEq/L — ABNORMAL LOW (ref 3.5–5.1)
Sodium: 140 mEq/L (ref 135–145)
Total Protein: 7.7 g/dL (ref 6.0–8.3)

## 2013-02-10 LAB — CBC WITH DIFFERENTIAL/PLATELET
Eosinophils Absolute: 0 10*3/uL (ref 0.0–0.7)
Lymphs Abs: 3.2 10*3/uL (ref 0.7–4.0)
MCH: 28.9 pg (ref 26.0–34.0)
Neutro Abs: 8.5 10*3/uL — ABNORMAL HIGH (ref 1.7–7.7)
Neutrophils Relative %: 67 % (ref 43–77)
Platelets: 255 10*3/uL (ref 150–400)
RBC: 5.26 MIL/uL (ref 4.22–5.81)
WBC: 12.6 10*3/uL — ABNORMAL HIGH (ref 4.0–10.5)

## 2013-02-10 LAB — POCT I-STAT TROPONIN I: Troponin i, poc: 0 ng/mL (ref 0.00–0.08)

## 2013-02-10 LAB — URINALYSIS, ROUTINE W REFLEX MICROSCOPIC
Glucose, UA: NEGATIVE mg/dL
Hgb urine dipstick: NEGATIVE
Specific Gravity, Urine: 1.01 (ref 1.005–1.030)

## 2013-02-10 LAB — CK: Total CK: 375 U/L — ABNORMAL HIGH (ref 7–232)

## 2013-02-10 LAB — CG4 I-STAT (LACTIC ACID): Lactic Acid, Venous: 1.33 mmol/L (ref 0.5–2.2)

## 2013-02-10 MED ORDER — MORPHINE SULFATE 4 MG/ML IJ SOLN
8.0000 mg | Freq: Once | INTRAMUSCULAR | Status: AC
  Administered 2013-02-10: 8 mg via INTRAVENOUS
  Filled 2013-02-10: qty 2

## 2013-02-10 MED ORDER — POTASSIUM CHLORIDE 10 MEQ/100ML IV SOLN
10.0000 meq | Freq: Once | INTRAVENOUS | Status: AC
  Administered 2013-02-10: 10 meq via INTRAVENOUS
  Filled 2013-02-10: qty 100

## 2013-02-10 MED ORDER — SODIUM CHLORIDE 0.9 % IV BOLUS (SEPSIS)
1000.0000 mL | Freq: Once | INTRAVENOUS | Status: AC
  Administered 2013-02-10: 1000 mL via INTRAVENOUS

## 2013-02-10 MED ORDER — POTASSIUM CHLORIDE CRYS ER 20 MEQ PO TBCR
40.0000 meq | EXTENDED_RELEASE_TABLET | Freq: Once | ORAL | Status: AC
  Administered 2013-02-10: 40 meq via ORAL
  Filled 2013-02-10: qty 2

## 2013-02-10 MED ORDER — OXYCODONE-ACETAMINOPHEN 5-325 MG PO TABS: 2.0000 | ORAL_TABLET | Freq: Four times a day (QID) | ORAL | Status: DC | PRN

## 2013-02-10 MED ORDER — ONDANSETRON HCL 4 MG/2ML IJ SOLN
4.0000 mg | Freq: Once | INTRAMUSCULAR | Status: AC
  Administered 2013-02-10: 4 mg via INTRAVENOUS
  Filled 2013-02-10: qty 2

## 2013-02-10 MED ORDER — SODIUM CHLORIDE 0.9 % IV SOLN: INTRAVENOUS | Status: DC

## 2013-02-10 MED ORDER — SODIUM CHLORIDE 0.9 % IV BOLUS (SEPSIS)
2000.0000 mL | Freq: Once | INTRAVENOUS | Status: AC
  Administered 2013-02-10: 1000 mL via INTRAVENOUS

## 2013-02-10 NOTE — ED Notes (Signed)
Received pt from home with c/o ran out of his pain medications and lost his prescriptions. Pt c/o withdrawal symptoms, "lost 10 lbs overnight from diarrhea." Pt has been taking narcotic medications for "30 years." Pt also reported center chest tightness onset 1 hour ago, no other symptoms. Pt with full body twitching, but responsive. Reports, "They call it seizures." Pt verbal, AAO and aware with the full body twitching.

## 2013-02-10 NOTE — ED Provider Notes (Signed)
CSN: [id number]     Arrival date & time 02/10/13  1555 History   First MD Initiated Contact with Patient 02/10/13 1559     Chief Complaint  Patient presents with  . Chest Pain  . Medication Refill   (Consider location/radiation/quality/duration/timing/severity/associated sxs/prior Treatment) HPI 62yo male difficult to obtain HPI/PMH/ROS due to memory loss which Pt states is chronic, Pt states has stable chronic severe back and neck pain 24/7 for years and lost his prescriptions unknown time ago, has diarrhea unknown time, has chronic daily cough and CP for hours at a time for unknown period possibly years, has chronic general weakness bilat foot drop and uses walker for years, generally weaker today so called EMS when his wife left the house, Pt states called EMS since has diarrhea for unknown time and no pain meds for unknown time, states had hallucinations possibly in last week but not today, denies AMS/fever/SOB/abd pain/or new focal weak, numb, or change in speech/vision/swallow/understanding or headache. Past Medical History  Diagnosis Date  . Hyperlipemia   . Chronic back pain   . GERD (gastroesophageal reflux disease)   . Anxiety   . Chronic narcotic dependence   . Hypothyroidism   . Depression   . Myoclonus   . UTI (lower urinary tract infection) 5/14    was confused and was found in [place].  Marland Kitchen Acute encephalopathy 09/06/12    was admitted to [facility]  . Foot drop, bilateral   . Shortness of breath     with exertion  . CHF (congestive heart failure)     diurects stopped in April  . DVT (deep venous thrombosis)     01/2012  . Seizures     Myoclonus  . Neuropathy     legs  . Arthritis   . Memory loss of unknown cause   . AVN (avascular necrosis of bone)     right   Past Surgical History  Procedure Laterality Date  . Back surgery      4 lumbar  . Hip surgery Left   . Cervical fusion    . Knee arthroscopy Right   . Joint replacement    . Pain pump implantation  Reports no  longer has pump  . Tooth extraction N/A 11/16/2012    Procedure: DENTAL EXTRACTIONS OF MULTIPLE NONRESTORABLE TEETH;  Surgeon: Hinton Dyer, DDS;  Location: [facility] OR;  Service: Oral Surgery;  Laterality: N/A;  #12, 14, 15, 22, 23, 24, 25, 26, 27, 28   Family History  Problem Relation Age of Onset  . CAD Father    History  Substance Use Topics  . Smoking status: Never Smoker   . Smokeless tobacco: Never Used  . Alcohol Use: No    Review of Systems  Unable to perform ROS: Dementia    Allergies  Ceftin and Statins  Home Medications   Current Outpatient Rx  Name  Route  Sig  Dispense  Refill  . carisoprodol (SOMA) 350 MG tablet   Oral   Take 350 mg by mouth 3 (three) times daily as needed for muscle spasms.          . diazepam (VALIUM) 2 MG tablet   Oral   Take 2 mg by mouth every 6 (six) hours as needed for anxiety.         . DULoxetine (CYMBALTA) 60 MG capsule   Oral   Take 120 mg by mouth daily.         . Eszopiclone (ESZOPICLONE) 3 MG  TABS   Oral   Take 3 mg by mouth at bedtime. Take immediately before bedtime         . ezetimibe (ZETIA) 10 MG tablet   Oral   Take 10 mg by mouth daily.         . Multiple Vitamin (MULTIVITAMIN WITH MINERALS) TABS tablet   Oral   Take 1 tablet by mouth daily.         Marland Kitchen omeprazole (PRILOSEC) 20 MG capsule   Oral   Take 20 mg by mouth daily.         . propranolol (INDERAL) 20 MG tablet   Oral   Take 20 mg by mouth 2 (two) times daily.         . temazepam (RESTORIL) 15 MG capsule   Oral   Take 15 mg by mouth at bedtime.         Marland Kitchen oxyCODONE-acetaminophen (PERCOCET) 5-325 MG per tablet   Oral   Take 2 tablets by mouth every 6 (six) hours as needed for pain.   30 tablet   0    BP 144/78  Pulse 63  Resp 17  Ht 5\' 10"  (1.778 m)  Wt 250 lb (113.399 kg)  BMI 35.87 kg/m2  SpO2 95% Physical Exam  Nursing note and vitals reviewed. Constitutional: He is oriented to person, place, and time.  Awake, alert,  nontoxic appearance with baseline speech for patient.  HENT:  Head: Atraumatic.  Mouth/Throat: No oropharyngeal exudate.  Eyes: EOM are normal. Pupils are equal, round, and reactive to light. Right eye exhibits no discharge. Left eye exhibits no discharge.  Neck: Neck supple.  Neck baseline tenderness  Cardiovascular: Normal rate and regular rhythm.   No murmur heard. Pulmonary/Chest: Effort normal and breath sounds normal. No stridor. No respiratory distress. He has no wheezes. He has no rales. He exhibits tenderness.  Partially reproducible chest wall tenderness  Abdominal: Soft. Bowel sounds are normal. He exhibits no mass. There is no tenderness. There is no rebound.  Musculoskeletal: He exhibits tenderness. He exhibits no edema.  Baseline ROM, moves extremities with no obvious new focal weakness.Baseline diffuse lumbar tenderness.  Lymphadenopathy:    He has no cervical adenopathy.  Neurological: He is alert and oriented to person, place, and time.  Awake, alert, cooperative and partially aware of situation; motor strength 4/5 bilaterally; sensation normal to light touch bilaterally; peripheral visual fields full to confrontation; no facial asymmetry; tongue midline; major cranial nerves appear intact; no pronator drift, normal finger to nose bilaterally  Skin: No rash noted.  Psychiatric: He has a normal mood and affect.    ED Course  Procedures (including critical care time) ECG: sinus rhythm, rate 58, normal axis, no acute ischemic changes noted, no significant change noted c/w Sep2014  Wife arrived states Pt ran out of pain meds a week ago, has appt next week with pain mgmt, few days diarrhea, no AMS, has baseline poor memory and has baseline mental status now, Pt usually confused to time and better than usual that he knows day of the week today, now off Coumadin, has baseline general weakness, Pt hadn't mentioned any cough or CP to her, feels Pt's Sxs due to withdrawal.Patient /  Family / Caregiver understand and agree with initial ED impression and plan with expectations set for ED visit. 1640  Much better in ED.Patient / Family / Caregiver informed of clinical course, understand medical decision-making process, and agree with plan.Withdrawal Sxs and pain much better in  ED.   Labs Review Labs Reviewed  CBC WITH DIFFERENTIAL - Abnormal; Notable for the following:    WBC 12.6 (*)    Neutro Abs 8.5 (*)    All other components within normal limits  COMPREHENSIVE METABOLIC PANEL - Abnormal; Notable for the following:    Potassium 2.9 (*)    Glucose, Bld 115 (*)    All other components within normal limits  CK - Abnormal; Notable for the following:    Total CK 375 (*)    All other components within normal limits  URINE CULTURE  CULTURE, BLOOD (ROUTINE X 2)  CULTURE, BLOOD (ROUTINE X 2)  PROTIME-INR  URINALYSIS, ROUTINE W REFLEX MICROSCOPIC  AMMONIA  CG4 I-STAT (LACTIC ACID)  POCT I-STAT TROPONIN I   Imaging Review Dg Chest 2 View  02/10/2013   CLINICAL DATA:  Chest pain  EXAM: CHEST  2 VIEW  COMPARISON:  01/07/2013  FINDINGS: The heart size and mediastinal contours are within normal limits. Both lungs are clear. The visualized skeletal structures are unremarkable.  IMPRESSION: No active cardiopulmonary disease.   Electronically Signed   By: Christiana Pellant M.D.   On: 02/10/2013 17:32   Ct Head Wo Contrast  02/10/2013   CLINICAL DATA:  Narcotic withdrawals  EXAM: CT HEAD WITHOUT CONTRAST  TECHNIQUE: Contiguous axial images were obtained from the base of the skull through the vertex without intravenous contrast.  COMPARISON:  09/06/2012  FINDINGS: The bony calvarium is intact. The ventricles and cortical sulci are mildly prominent consistent with mild atrophy. No findings to suggest acute hemorrhage, acute infarction or space-occupying mass lesion are noted.  IMPRESSION: Mild atrophic changes without acute abnormality.   Electronically Signed   By: Alcide Clever M.D.    On: 02/10/2013 17:25    MDM   1. Narcotic withdrawal   2. Hypokalemia   3. Diarrhea   4. Chronic pain    I doubt any other EMC precluding discharge at this time including, but not necessarily limited to the following:sepsis, ACS.    Hurman Horn, MD 02/11/13 [phone]

## 2013-02-11 ENCOUNTER — Inpatient Hospital Stay (HOSPITAL_COMMUNITY)
Admission: EM | Admit: 2013-02-11 | Discharge: 2013-02-15 | DRG: 897 | Disposition: A | Discharge status: Home / Self Care | Payer: Medicare Other | Attending: Internal Medicine | Admitting: Internal Medicine

## 2013-02-11 ENCOUNTER — Emergency Department (HOSPITAL_COMMUNITY): Payer: Medicare Other

## 2013-02-11 ENCOUNTER — Encounter (HOSPITAL_COMMUNITY): Payer: Self-pay | Admitting: Emergency Medicine

## 2013-02-11 ENCOUNTER — Telehealth (HOSPITAL_COMMUNITY): Payer: Self-pay | Admitting: Emergency Medicine

## 2013-02-11 DIAGNOSIS — K219 Gastro-esophageal reflux disease without esophagitis: Secondary | ICD-10-CM | POA: Diagnosis present

## 2013-02-11 DIAGNOSIS — R569 Unspecified convulsions: Secondary | ICD-10-CM | POA: Diagnosis not present

## 2013-02-11 DIAGNOSIS — Z79899 Other long term (current) drug therapy: Secondary | ICD-10-CM

## 2013-02-11 DIAGNOSIS — E039 Hypothyroidism, unspecified: Secondary | ICD-10-CM | POA: Diagnosis present

## 2013-02-11 DIAGNOSIS — F1120 Opioid dependence, uncomplicated: Secondary | ICD-10-CM | POA: Diagnosis present

## 2013-02-11 DIAGNOSIS — R197 Diarrhea, unspecified: Secondary | ICD-10-CM | POA: Diagnosis present

## 2013-02-11 DIAGNOSIS — R7881 Bacteremia: Secondary | ICD-10-CM | POA: Diagnosis present

## 2013-02-11 DIAGNOSIS — F3289 Other specified depressive episodes: Secondary | ICD-10-CM | POA: Diagnosis present

## 2013-02-11 DIAGNOSIS — M549 Dorsalgia, unspecified: Secondary | ICD-10-CM | POA: Diagnosis present

## 2013-02-11 DIAGNOSIS — F1123 Opioid dependence with withdrawal: Secondary | ICD-10-CM

## 2013-02-11 DIAGNOSIS — M79609 Pain in unspecified limb: Secondary | ICD-10-CM | POA: Diagnosis present

## 2013-02-11 DIAGNOSIS — F1320 Sedative, hypnotic or anxiolytic dependence, uncomplicated: Secondary | ICD-10-CM | POA: Diagnosis present

## 2013-02-11 DIAGNOSIS — G253 Myoclonus: Secondary | ICD-10-CM | POA: Diagnosis present

## 2013-02-11 DIAGNOSIS — T40605A Adverse effect of unspecified narcotics, initial encounter: Secondary | ICD-10-CM | POA: Diagnosis present

## 2013-02-11 DIAGNOSIS — F329 Major depressive disorder, single episode, unspecified: Secondary | ICD-10-CM | POA: Diagnosis present

## 2013-02-11 DIAGNOSIS — E876 Hypokalemia: Secondary | ICD-10-CM | POA: Diagnosis present

## 2013-02-11 DIAGNOSIS — M216X9 Other acquired deformities of unspecified foot: Secondary | ICD-10-CM | POA: Diagnosis present

## 2013-02-11 DIAGNOSIS — F19939 Other psychoactive substance use, unspecified with withdrawal, unspecified: Principal | ICD-10-CM | POA: Diagnosis present

## 2013-02-11 DIAGNOSIS — B957 Other staphylococcus as the cause of diseases classified elsewhere: Secondary | ICD-10-CM | POA: Diagnosis present

## 2013-02-11 DIAGNOSIS — F1193 Opioid use, unspecified with withdrawal: Secondary | ICD-10-CM

## 2013-02-11 DIAGNOSIS — G8929 Other chronic pain: Secondary | ICD-10-CM | POA: Diagnosis present

## 2013-02-11 DIAGNOSIS — F411 Generalized anxiety disorder: Secondary | ICD-10-CM | POA: Diagnosis present

## 2013-02-11 DIAGNOSIS — E785 Hyperlipidemia, unspecified: Secondary | ICD-10-CM | POA: Diagnosis present

## 2013-02-11 MED ORDER — VANCOMYCIN HCL IN DEXTROSE 1-5 GM/200ML-% IV SOLN
1000.0000 mg | Freq: Once | INTRAVENOUS | Status: AC
  Administered 2013-02-12: 1000 mg via INTRAVENOUS
  Filled 2013-02-11: qty 200

## 2013-02-11 MED ORDER — LORAZEPAM 2 MG/ML IJ SOLN
1.0000 mg | Freq: Once | INTRAMUSCULAR | Status: AC
  Administered 2013-02-12: 1 mg via INTRAVENOUS
  Filled 2013-02-11: qty 1

## 2013-02-11 MED ORDER — SODIUM CHLORIDE 0.9 % IV SOLN
1000.0000 mL | Freq: Once | INTRAVENOUS | Status: AC
  Administered 2013-02-12: 1000 mL via INTRAVENOUS

## 2013-02-11 MED ORDER — PIPERACILLIN-TAZOBACTAM 3.375 G IVPB 30 MIN
3.3750 g | Freq: Once | INTRAVENOUS | Status: AC
  Administered 2013-02-12: 3.375 g via INTRAVENOUS
  Filled 2013-02-11: qty 50

## 2013-02-11 MED ORDER — MORPHINE SULFATE 4 MG/ML IJ SOLN
8.0000 mg | Freq: Once | INTRAMUSCULAR | Status: AC
  Administered 2013-02-12: 8 mg via INTRAVENOUS
  Filled 2013-02-11: qty 2

## 2013-02-11 MED ORDER — SODIUM CHLORIDE 0.9 % IV SOLN: 1000.0000 mL | INTRAVENOUS | Status: DC

## 2013-02-11 NOTE — ED Notes (Signed)
Per pt: at [facility] last night for withdrawal from meds, had blood cultures drawn and [facility] called back and said something had grown in his blood. Pt has been dry heaving and is insevere back pain from previous back surgeries.

## 2013-02-11 NOTE — ED Notes (Signed)
Pt placed on cardiac monitor per EDP Bednar order

## 2013-02-11 NOTE — ED Notes (Signed)
9:52 pm call received from [facility] reporting 1 of 4 blood culture bottles (aerobic bottle) growing gram (+) cocci in clusters.  Dr Jinger Neighbors consulted via telephone "Wellness check with patients wife"  10:10 pm Spoke with patients wife.  Patient not improved having dry heaves, pain shooting down both legs and diarrhea.  Pharmacy refused to fill patients Rx for Percocet written by Dr Jinger Neighbors.  Both Dr Fonnie Jarvis and FM spoke with Pharmacist @ [facility] [address] and informed pt should have pain medication left according to when filled.  Per T/O from Dr Fonnie Jarvis Have patient return for treatment of withdrawal and to figure out pain medication issues also have wife call FM office in ~ 12 hours to report how patient is and to check on status of remaining 3 blood culture preliminary reports if patient doesn't return.  Patient's wife informed and also provided direct Number for FM office.

## 2013-02-12 ENCOUNTER — Encounter (HOSPITAL_COMMUNITY): Payer: Self-pay | Admitting: Internal Medicine

## 2013-02-12 ENCOUNTER — Inpatient Hospital Stay (HOSPITAL_COMMUNITY): Payer: Medicare Other

## 2013-02-12 DIAGNOSIS — G8929 Other chronic pain: Secondary | ICD-10-CM

## 2013-02-12 DIAGNOSIS — R7881 Bacteremia: Secondary | ICD-10-CM | POA: Diagnosis present

## 2013-02-12 DIAGNOSIS — B957 Other staphylococcus as the cause of diseases classified elsewhere: Secondary | ICD-10-CM | POA: Diagnosis not present

## 2013-02-12 DIAGNOSIS — R197 Diarrhea, unspecified: Secondary | ICD-10-CM | POA: Diagnosis present

## 2013-02-12 DIAGNOSIS — R569 Unspecified convulsions: Secondary | ICD-10-CM | POA: Diagnosis not present

## 2013-02-12 DIAGNOSIS — F1120 Opioid dependence, uncomplicated: Secondary | ICD-10-CM

## 2013-02-12 DIAGNOSIS — F19939 Other psychoactive substance use, unspecified with withdrawal, unspecified: Secondary | ICD-10-CM | POA: Diagnosis not present

## 2013-02-12 DIAGNOSIS — E876 Hypokalemia: Secondary | ICD-10-CM | POA: Diagnosis present

## 2013-02-12 LAB — COMPREHENSIVE METABOLIC PANEL
ALT: 15 U/L (ref 0–53)
ALT: 17 U/L (ref 0–53)
ALT: 21 U/L (ref 0–53)
AST: 29 U/L (ref 0–37)
Albumin: 3.5 g/dL (ref 3.5–5.2)
Alkaline Phosphatase: 101 U/L (ref 39–117)
Alkaline Phosphatase: 105 U/L (ref 39–117)
Alkaline Phosphatase: 96 U/L (ref 39–117)
BUN: 11 mg/dL (ref 6–23)
BUN: 9 mg/dL (ref 6–23)
CO2: 24 mEq/L (ref 19–32)
Calcium: 8.9 mg/dL (ref 8.4–10.5)
Chloride: 109 mEq/L (ref 96–112)
Chloride: 105 mEq/L (ref 96–112)
Creatinine, Ser: 0.69 mg/dL (ref 0.50–1.35)
Creatinine, Ser: 0.75 mg/dL (ref 0.50–1.35)
GFR calc Af Amer: >90 mL/min (ref >90)
GFR calc Af Amer: >90 mL/min (ref >90)
GFR calc Af Amer: >90 mL/min (ref >90)
GFR calc non Af Amer: >90 mL/min (ref >90)
GFR calc non Af Amer: >90 mL/min (ref >90)
Glucose, Bld: 100 mg/dL — ABNORMAL HIGH (ref 70–99)
Glucose, Bld: 109 mg/dL — ABNORMAL HIGH (ref 70–99)
Glucose, Bld: 98 mg/dL (ref 70–99)
Potassium: 3 mEq/L — ABNORMAL LOW (ref 3.5–5.1)
Potassium: 3.2 mEq/L — ABNORMAL LOW (ref 3.5–5.1)
Potassium: 3.2 mEq/L — ABNORMAL LOW (ref 3.5–5.1)
Sodium: 139 mEq/L (ref 135–145)
Sodium: 141 mEq/L (ref 135–145)
Sodium: 142 mEq/L (ref 135–145)
Total Bilirubin: 0.5 mg/dL (ref 0.3–1.2)
Total Bilirubin: 0.7 mg/dL (ref 0.3–1.2)
Total Bilirubin: 0.7 mg/dL (ref 0.3–1.2)
Total Protein: 6.6 g/dL (ref 6.0–8.3)

## 2013-02-12 LAB — URINE CULTURE
Colony Count: NO GROWTH
Culture: NO GROWTH

## 2013-02-12 LAB — CBC WITH DIFFERENTIAL/PLATELET
Basophils Absolute: 0 10*3/uL (ref 0.0–0.1)
Basophils Relative: 0 % (ref 0–1)
Basophils Relative: 0 % (ref 0–1)
Eosinophils Absolute: 0 10*3/uL (ref 0.0–0.7)
HCT: 42.1 % (ref 39.0–52.0)
Hemoglobin: 14.4 g/dL (ref 13.0–17.0)
Lymphs Abs: 1.8 10*3/uL (ref 0.7–4.0)
MCH: 28.4 pg (ref 26.0–34.0)
MCHC: 34 g/dL (ref 30.0–36.0)
MCHC: 34.2 g/dL (ref 30.0–36.0)
MCV: 83.6 fL (ref 78.0–100.0)
MCV: 84 fL (ref 78.0–100.0)
Monocytes Absolute: 0.7 10*3/uL (ref 0.1–1.0)
Monocytes Absolute: 0.8 10*3/uL (ref 0.1–1.0)
Monocytes Relative: 7 % (ref 3–12)
Neutro Abs: 7.1 10*3/uL (ref 1.7–7.7)
Neutrophils Relative %: 57 % (ref 43–77)
Neutrophils Relative %: 74 % (ref 43–77)
Platelets: 238 10*3/uL (ref 150–400)
WBC: 10.5 10*3/uL (ref 4.0–10.5)

## 2013-02-12 LAB — TROPONIN I
Troponin I: <0.3 ng/mL (ref <0.30)
Troponin I: <0.3 ng/mL (ref <0.30)

## 2013-02-12 LAB — URINALYSIS, ROUTINE W REFLEX MICROSCOPIC
Glucose, UA: NEGATIVE mg/dL
Hgb urine dipstick: NEGATIVE
Ketones, ur: NEGATIVE mg/dL
Leukocytes, UA: NEGATIVE
Protein, ur: NEGATIVE mg/dL
pH: 5 (ref 5.0–8.0)

## 2013-02-12 LAB — GLUCOSE, CAPILLARY

## 2013-02-12 LAB — TSH: TSH: 1.716 u[IU]/mL (ref 0.350–4.500)

## 2013-02-12 LAB — MRSA PCR SCREENING: MRSA by PCR: NEGATIVE

## 2013-02-12 LAB — MAGNESIUM: Magnesium: 1.9 mg/dL (ref 1.5–2.5)

## 2013-02-12 LAB — CBC
HCT: 39.8 % (ref 39.0–52.0)
MCHC: 33.7 g/dL (ref 30.0–36.0)
Platelets: 209 10*3/uL (ref 150–400)
RDW: 13.5 % (ref 11.5–15.5)
WBC: 9.5 10*3/uL (ref 4.0–10.5)

## 2013-02-12 MED ORDER — DULOXETINE HCL 60 MG PO CPEP
120.0000 mg | ORAL_CAPSULE | Freq: Every morning | ORAL | Status: DC
  Administered 2013-02-12 – 2013-02-15 (×4): 120 mg via ORAL
  Filled 2013-02-12 (×4): qty 2

## 2013-02-12 MED ORDER — ONDANSETRON HCL 4 MG PO TABS: 4.0000 mg | ORAL_TABLET | Freq: Four times a day (QID) | ORAL | Status: DC | PRN

## 2013-02-12 MED ORDER — TEMAZEPAM 15 MG PO CAPS
15.0000 mg | ORAL_CAPSULE | Freq: Every day | ORAL | Status: DC
  Administered 2013-02-12 – 2013-02-14 (×3): 15 mg via ORAL
  Filled 2013-02-12 (×3): qty 1

## 2013-02-12 MED ORDER — MORPHINE SULFATE 2 MG/ML IJ SOLN
2.0000 mg | INTRAMUSCULAR | Status: DC | PRN
  Administered 2013-02-12: 2 mg via INTRAVENOUS
  Administered 2013-02-12: 4 mg via INTRAVENOUS
  Filled 2013-02-12 (×2): qty 2

## 2013-02-12 MED ORDER — EZETIMIBE 10 MG PO TABS
10.0000 mg | ORAL_TABLET | Freq: Every day | ORAL | Status: DC
  Administered 2013-02-12 – 2013-02-15 (×4): 10 mg via ORAL
  Filled 2013-02-12 (×4): qty 1

## 2013-02-12 MED ORDER — HYDROCODONE-ACETAMINOPHEN 5-325 MG PO TABS
1.0000 | ORAL_TABLET | ORAL | Status: DC | PRN
  Administered 2013-02-12 – 2013-02-14 (×8): 2 via ORAL
  Administered 2013-02-14: 1 via ORAL
  Administered 2013-02-15: 2 via ORAL
  Filled 2013-02-12 (×4): qty 2
  Filled 2013-02-12: qty 1
  Filled 2013-02-12 (×5): qty 2

## 2013-02-12 MED ORDER — SODIUM CHLORIDE 0.9 % IV SOLN
INTRAVENOUS | Status: DC
  Administered 2013-02-12: 05:00:00 via INTRAVENOUS

## 2013-02-12 MED ORDER — DIAZEPAM 2 MG PO TABS
2.0000 mg | ORAL_TABLET | Freq: Four times a day (QID) | ORAL | Status: DC | PRN
  Administered 2013-02-12 – 2013-02-14 (×7): 2 mg via ORAL
  Filled 2013-02-12 (×8): qty 1

## 2013-02-12 MED ORDER — ENOXAPARIN SODIUM 40 MG/0.4ML ~~LOC~~ SOLN
40.0000 mg | SUBCUTANEOUS | Status: DC
  Administered 2013-02-12 – 2013-02-15 (×4): 40 mg via SUBCUTANEOUS
  Filled 2013-02-12 (×4): qty 0.4

## 2013-02-12 MED ORDER — ACETAMINOPHEN 650 MG RE SUPP: 650.0000 mg | Freq: Four times a day (QID) | RECTAL | Status: DC | PRN

## 2013-02-12 MED ORDER — POTASSIUM CHLORIDE CRYS ER 20 MEQ PO TBCR
40.0000 meq | EXTENDED_RELEASE_TABLET | Freq: Once | ORAL | Status: AC
  Administered 2013-02-12: 40 meq via ORAL
  Filled 2013-02-12: qty 2

## 2013-02-12 MED ORDER — ONDANSETRON HCL 4 MG/2ML IJ SOLN
4.0000 mg | Freq: Four times a day (QID) | INTRAMUSCULAR | Status: DC | PRN
  Administered 2013-02-12: 4 mg via INTRAVENOUS
  Filled 2013-02-12: qty 2

## 2013-02-12 MED ORDER — OXYCODONE-ACETAMINOPHEN 5-325 MG PO TABS: 2.0000 | ORAL_TABLET | Freq: Four times a day (QID) | ORAL | Status: DC | PRN

## 2013-02-12 MED ORDER — ACETAMINOPHEN 325 MG PO TABS: 650.0000 mg | ORAL_TABLET | Freq: Four times a day (QID) | ORAL | Status: DC | PRN

## 2013-02-12 MED ORDER — PANTOPRAZOLE SODIUM 40 MG PO TBEC
40.0000 mg | DELAYED_RELEASE_TABLET | Freq: Every day | ORAL | Status: DC
  Administered 2013-02-12 – 2013-02-15 (×4): 40 mg via ORAL
  Filled 2013-02-12 (×4): qty 1

## 2013-02-12 MED ORDER — ZOLPIDEM TARTRATE 5 MG PO TABS: 5.0000 mg | ORAL_TABLET | Freq: Every evening | ORAL | Status: DC | PRN

## 2013-02-12 MED ORDER — PROPRANOLOL HCL 20 MG PO TABS
20.0000 mg | ORAL_TABLET | Freq: Two times a day (BID) | ORAL | Status: DC
Start: 2013-02-12 — End: 2013-02-15
  Administered 2013-02-12 – 2013-02-15 (×7): 20 mg via ORAL
  Filled 2013-02-12 (×10): qty 1

## 2013-02-12 MED ORDER — SODIUM CHLORIDE 0.9 % IV SOLN
INTRAVENOUS | Status: AC
  Administered 2013-02-12: 21:00:00 via INTRAVENOUS

## 2013-02-12 MED ORDER — CARISOPRODOL 350 MG PO TABS
350.0000 mg | ORAL_TABLET | Freq: Three times a day (TID) | ORAL | Status: DC | PRN
  Administered 2013-02-12 – 2013-02-15 (×6): 350 mg via ORAL
  Filled 2013-02-12 (×6): qty 1

## 2013-02-12 MED ORDER — ONDANSETRON HCL 4 MG/2ML IJ SOLN: 4.0000 mg | Freq: Once | INTRAMUSCULAR | Status: DC

## 2013-02-12 MED ORDER — CEFAZOLIN SODIUM-DEXTROSE 2-3 GM-% IV SOLR
2.0000 g | Freq: Three times a day (TID) | INTRAVENOUS | Status: DC
  Administered 2013-02-12 – 2013-02-13 (×4): 2 g via INTRAVENOUS
  Filled 2013-02-12 (×7): qty 50

## 2013-02-12 MED ORDER — LORAZEPAM 2 MG/ML IJ SOLN: 2.0000 mg | Freq: Four times a day (QID) | INTRAMUSCULAR | Status: DC | PRN

## 2013-02-12 NOTE — Progress Notes (Signed)
INITIAL NUTRITION ASSESSMENT  DOCUMENTATION CODES Per approved criteria  -Obesity Unspecified   INTERVENTION: - Encouraged increased intake - Will continue to monitor   NUTRITION DIAGNOSIS: Unintended weight loss related to poor appetite as evidenced by pt report.   Goal: Pt to consume >90% of meals  Monitor:  Weights, labs, intake  Reason for Assessment: Nutrition risk   62 y.o. male  Admitting Dx: Positive blood culture   ASSESSMENT: Pt with history of chronic pain on chronic narcotics. Pt ran out of Percocet 1.5 weeks ago and has been going through symptoms of opioid withdrawal including sweating, diarrhea, cough, nausea, and dry heaves. Pt has lost 40 pounds in the past 6 months. Pt reports not eating well for a long time as he has gotten older. Not specific in details of diet at home. Eating 50% of meals. Potasium low, getting oral replacement.   Height: Ht Readings from Last 1 Encounters:  02/11/13 5\' 10"  (1.778 m)    Weight: Wt Readings from Last 1 Encounters:  02/11/13 250 lb (113.399 kg)    Ideal Body Weight: 166 lb   % Ideal Body Weight: 151%  Wt Readings from Last 10 Encounters:  02/11/13 250 lb (113.399 kg)  02/10/13 250 lb (113.399 kg)  01/09/13 259 lb 14.4 oz (117.89 kg)  11/09/12 262 lb (118.842 kg)  09/06/12 290 lb (131.543 kg)  06/01/11 290 lb (131.543 kg)    Usual Body Weight: 290 lb in April 2014  % Usual Body Weight: 86%  BMI:  Body mass index is 35.87 kg/(m^2). Class II obesity  Estimated Nutritional Needs: Kcal: 1800-1900 Protein: 75-90g Fluid: 1.8-1.9L/day  Skin: Intact   Diet Order: General  EDUCATION NEEDS: -No education needs identified at this time   Intake/Output Summary (Last 24 hours) at 02/12/13 1707 Last data filed at 02/12/13 1454  Gross per 24 hour  Intake   1160 ml  Output   3925 ml  Net  -2765 ml    Last BM: 10/5  Labs:   Recent Labs Lab 02/10/13 1645 02/11/13 2350 02/12/13 0535  NA 140 139 142   K 2.9* 3.0* 3.2*  CL 103 106 109  CO2 25 23 24   BUN 11 11 9   CREATININE 0.69 0.69 0.75  CALCIUM 9.2 8.9 8.7  MG  --   --  1.9  PHOS  --   --  3.4  GLUCOSE 115* 100* 98    CBG (last 3)   Recent Labs  02/12/13 1644  GLUCAP 108*    Scheduled Meds: .  ceFAZolin (ANCEF) IV  2 g Intravenous Q8H  . DULoxetine  120 mg Oral q morning - 10a  . enoxaparin (LOVENOX) injection  40 mg Subcutaneous Q24H  . ezetimibe  10 mg Oral Daily  . ondansetron (ZOFRAN) IV  4 mg Intravenous Once  . pantoprazole  40 mg Oral Daily  . propranolol  20 mg Oral BID  . temazepam  15 mg Oral QHS    Continuous Infusions:   Past Medical History  Diagnosis Date  . Hyperlipemia   . Chronic back pain   . GERD (gastroesophageal reflux disease)   . Anxiety   . Chronic narcotic dependence   . Hypothyroidism   . Depression   . Myoclonus   . UTI (lower urinary tract infection) 5/14    was confused and was found in [place].  Marland Kitchen Acute encephalopathy 09/06/12    was admitted to [facility]  . Foot drop, bilateral   . Shortness of  breath     with exertion  . CHF (congestive heart failure)     diurects stopped in April  . DVT (deep venous thrombosis)     01/2012  . Seizures     Myoclonus  . Neuropathy     legs  . Arthritis   . Memory loss of unknown cause   . AVN (avascular necrosis of bone)     right    Past Surgical History  Procedure Laterality Date  . Back surgery      4 lumbar  . Hip surgery Left   . Cervical fusion    . Knee arthroscopy Right   . Joint replacement    . Pain pump implantation  Reports no longer has pump  . Tooth extraction N/A 11/16/2012    Procedure: DENTAL EXTRACTIONS OF MULTIPLE NONRESTORABLE TEETH;  Surgeon: Hinton Dyer, DDS;  Location: [facility] OR;  Service: Oral Surgery;  Laterality: N/A;  #12, 14, 15, 22, 23, 24, 25, 26, 27, [address] [place], [place], [place] [phone] Pager [phone] After Hours Pager

## 2013-02-12 NOTE — Progress Notes (Addendum)
Subjective/HPI: Alex Mcintosh is a 62 yo chronically ill, obese WM with a hx of chronic back pain, DVT, and myoclonic seizures who presented to ED on 02/11/13 with exacerbation of chronic pain secondary to being out of his medications. Has been on opoid and benzos for years for back pain and myoclonus which he says has been worked up over past 20-30 years. He reported running out of his opoids about 1.5 weeks ago and hadn't had a Valium in a few days upon admission. Also, was found to have bacteremia in ED and was started on abx. He was admitted by [facility] for the UTI and possible opoid/benzo withdrawal.  Tonight, CODE BLUE was paged to pt's room. Upon my arrival, pt was breathing and had a pulse. RN reports hearing pt call out and when she went in the room, he was shaking all over, unresponsive and "foaming at the mouth". RN said he was pulseless and she did about 1-2 mins of chest compressions and pt spontaneously came around. At that time, RN was not aware pt was a DNR. Code team responded and no ACLS measures were taken secondary to pt having a pulse and breathing. O2 was applied and pt was satting normally at that time. Orally suctioned for clear mucus. Pupil were equal and reactive, dilated about 8mm and eyes deviating to the left. After a couple more minutes, the pt began talking and could tell me his name although very sleepy. Pt has a Foley, so do not know if urinary incontinence occurred. No bowel incontinence. Small abrasion to tongue. VS were checked frequently and BP, HR remained stable. O2 sats, respiratory effort normal afterwards.  Staff stayed with pt until he was talking and more alert. He was able to support his own airway and was more alert. Pt was transferred to SDU for closer monitoring.   After TF to SDU, pt is alert, talking normally, and oriented. He is joking with staff. He says he has never had a full seizure, just myoclonus for years. Says he had an EEG many years ago to r/o  epilepsy when myoclonus was being worked up. His only c/o at this time is back pain. He is upset that his Morphine was "taken away" today.   ROS: same as above  Objective: Vital signs in last 24 hours: Temp:  [97.7 F (36.5 C)-99.4 F (37.4 C)] 99 F (37.2 C) (10/06 1829) Pulse Rate:  [54-97] 91 (10/06 2000) Resp:  [16-24] 18 (10/06 2000) BP: (131-162)/(57-97) 158/97 mmHg (10/06 2000) SpO2:  [95 %-98 %] 95 % (10/06 2000) Weight:  [113.399 kg (250 lb)-125.2 kg (276 lb 0.3 oz)] 125.2 kg (276 lb 0.3 oz) (10/06 2000) Weight change:  Last BM Date: 02/11/13  Intake/Output from previous day: 10/05 0701 - 10/06 0700 In: -  Out: 2000 [Urine:2000] Intake/Output this shift:    PE: (After event) Gen: appears fairly well now, not toxic appearing. Obese.  HEENT: AC, NT. Neck supple, ROM to neck adequate. Tongue with pinpoint area of redness, no active bleeding. CARD: RRR RESP: normal effort, no increased WOB, CTA ABD: soft, NT GU: foley MSK: MOE x 4. NEURO: PERRL, 8mm, no deviation of eyes, EOM intact. Tongue is midline. Strength is symmetrical throughout. Foot drop noted (chronic).  PSYCH: Alert, oriented. Smiling, joking with staff.   Lab Results:  Recent Labs  02/11/13 2350 02/12/13 0535  WBC 10.5 9.5  HGB 13.7 13.4  HCT 40.3 39.8  PLT 238 209   BMET  Recent Labs  02/11/13 2350 02/12/13 0535  NA 139 142  K 3.0* 3.2*  CL 106 109  CO2 23 24  GLUCOSE 100* 98  BUN 11 9  CREATININE 0.69 0.75  CALCIUM 8.9 8.7    Studies/Results: Dg Chest 2 View  02/12/2013   CLINICAL DATA:  Congestive heart failure, chest pain and pressure  EXAM: CHEST  2 VIEW  COMPARISON:  02/12/2013  FINDINGS: Mild cardiac enlargement stable. Vascular pattern normal. Lungs clear. No pleural effusions.  IMPRESSION: No active cardiopulmonary disease.   Electronically Signed   By: Esperanza Heir M.D.   On: 02/12/2013 16:05   Dg Chest Port 1 View  02/12/2013   *RADIOLOGY REPORT*  Clinical Data:  Cough.  PORTABLE CHEST - 1 VIEW  Comparison: Chest radiograph from 10/01 of 1014  Findings: The lungs are relatively well-aerated.  Vascular congestion is noted.  There is no evidence of focal opacification, pleural effusion or pneumothorax.  The cardiomediastinal silhouette is borderline normal in size.  No acute osseous abnormalities are seen.  IMPRESSION: Vascular congestion noted; lungs remain grossly clear.   Original Report Authenticated By: Tonia Ghent, M.D.    Medications: reviewed  Assessment/Plan: 1. Seizure, generalized. No hx. ? Benzo withdrawal. Awake and alert now. Neuro exam normal afterwards except as per chronic hx. Ativan added prn seizure. On his Valium from home. Hold Ambien tonight. Will observe in SDU tonight. Should he have further seizure activity (besides his normal myoclonus) will call neuro. For now, this can wait til am if he remains stable. Consider CT head, MRI brain, and/or EEG tomorrow if not done this hospitalization. CMP, CBC with diff, prolactin next blood draw.  2. 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 Bacteremia-on abx, has Foley. Has already had cultures done.  4. Chronic pain-back on opoids except morphine which MD stopped today.  5. Chest discomfort-cycling troponins, EKG and first trop neg. Rest of plan as prior to seizure. Will follow closely.  PT IS A DNR/DNI Case/plan discussed with Dr. Rhona Leavens who will edit note as needed and cosign.    LOS: 1 day   Jimmye Norman 02/12/2013, 8:13 PM

## 2013-02-12 NOTE — ED Notes (Signed)
Pt has c/o redness itching to back of head, neck and scalp.  Vanc started approximately 20 mins prior to c/o. Vanc stopped.  Dr. Rosamaria Lints at bedside.

## 2013-02-12 NOTE — Progress Notes (Signed)
CARE MANAGEMENT NOTE 02/12/2013  Patient:  Alex Mcintosh, Alex Mcintosh   Account Number:  [id number]  Date Initiated:  02/12/2013  Documentation initiated by:  [facility]  Subjective/Objective Assessment:   62 year old male admitted with bacteremia.     Action/Plan:   From home with wife.   Anticipated DC Date:  02/15/2013   Anticipated DC Plan:  HOME/SELF CARE      DC Planning Services  CM consult        Status of service:  In process, will continue to follow Medicare Important Message given?  NA - LOS <3 / Initial given by admissions  Per UR Regulation:  Reviewed for med. necessity/level of care/duration of stay   Comments:  02/12/13 [facility] RN BSN Consult received in regards to helping pt with medications. I met wit pt at the bedside. He stated he has been on pain meds for years and lately his insurance company has been been denying covering them. He stated he personally hasnt not called customer service for the insurance company and instead his pharmacist has been handling it. I advised him to call them or have his wife call them so that he can understand what the problem is. Pt thanked me for explaining this to him.

## 2013-02-12 NOTE — Progress Notes (Signed)
Wife called and updated on pt's progress at time of incident and one hour later. KJKG, NP

## 2013-02-12 NOTE — Progress Notes (Signed)
3:57 PM  I agree with HPI/GPe and A/P per Dr. Rosamaria Lints   Patient admitted and states he felt better when  i earlier saw the patient Called at about 1530 by RN as patient having unilateral jerking of his L arm as well as chest heaviness and tightness.  He was alert and oriented throughout event.  He carries apparently a h/o Sz which hacve been Rx ion the past with Xanax and which controls this as well      HEENT-eomi [employer] CHEST-clear, no added sound CARDIAC-s1 s 2no m/r/g ABDOMEN-soft nt, nd NEURO-intact, moving all 4 limbs equally, no focal deficit, has episodes of "jerking" which last for a couple of seconds and then completely abate and patient is alert throughout   Admission 01/07/13 with possible  decompensated chf, DVT with supra therapeutic INR  Admission 09/06/12 with altered mental status and marked confusion probably 2/2 to drugs.  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/10 L hip Osteo, myoclonic sxz  Patient Active Problem List   Diagnosis Date Noted  . Hypokalemia 02/12/2013  . Bacteremia due to Gram-positive bacteria 02/12/2013  . Diarrhea 02/12/2013  . Chest discomfort 01/07/2013  . Acute encephalopathy 09/06/2012  . Hyperlipidemia 09/06/2012  . History of DVT of lower extremity 09/06/2012  . Chronic pain 09/06/2012  . UTI (lower urinary tract infection) 09/06/2012   A/p EKG, CE's for CP Consider EEG-although sounds very much like Psychogenic Sx Chronic pain will need to be managed by ? Dr. Genella Mech of [facility] and I will interface with him in the am +blood cultures-monitor  Pleas Koch, MD [facility] [phone]

## 2013-02-12 NOTE — Progress Notes (Signed)
Clinical Social Work  CSW received inappropriate referral to assist with insurance and medication issues. CSW made CM aware of needs. CSW is signing off but available if further needs arise.  [place], [place] [phone]

## 2013-02-12 NOTE — H&P (Signed)
PCP:  Carolynn Serve, NP    Chief Complaint:   positive blood culture  HPI: Alex Mcintosh is a 63 y.o. male   has a past medical history of Hyperlipemia; Chronic back pain; GERD (gastroesophageal reflux disease); Anxiety; Chronic narcotic dependence; Hypothyroidism; Depression; Myoclonus; UTI (lower urinary tract infection) (5/14); Acute encephalopathy (09/06/12); Foot drop, bilateral; Shortness of breath; CHF (congestive heart failure); DVT (deep venous thrombosis); Seizures; Neuropathy; Arthritis; Memory loss of unknown cause; and AVN (avascular necrosis of bone).   Presented with  [name] has long standing history of chronic pain on chronic narcotics. He has also hx of multiple back surgeries and infections.  Patient has run out of his Percocet for the past 1 and a half weeks. Wife states they have been battling insurance companies to get his prescriptions. 3-4 days ago he started to have symptoms of opioid withdrawal with sweating, diarrhea and cough and nausea with dry heaves. He presented to ER due to uncontrolled  pain yesterday. As part of the work up blood cultures were obtained. He was discharged to home with prescription for Percocet. The pharmacist refused to fill that stating that patient should still have enough from prior prescriptions.  Today his cultures returned to be 1/2 positive for Gram post cocci. Patient was called in to come back to ER. Denies any fever or chills. His symptoms of withdrawal started to come back. He was started on Vancomycin but developed itching and redness over his back and this was discontinued.  Of note patient is also out of his valium last dose was 1 week ago. He states he was given prescriptions for morphine but this was lost.  Of note patient states that his symptoms have improved once he has received a dose of morphine. Patient has chronic problems with mobility but her neuropathy and foot drop. Review of Systems:    Pertinent positives  include: fatigue, nausea, vomiting, diarrhea, back pain,  productive cough,  Rash,  back pain  Constitutional:  No weight loss, night sweats, Fevers, chills,  weight loss  HEENT:  No headaches, Difficulty swallowing,Tooth/dental problems,Sore throat,  No sneezing, itching, ear ache, nasal congestion, post nasal drip,  Cardio-vascular:  No chest pain, Orthopnea, PND, anasarca, dizziness, palpitations.no Bilateral lower extremity swelling  GI:  No heartburn, indigestion, abdominal pain, change in bowel habits, loss of appetite, melena, blood in stool, hematemesis Resp:  no shortness of breath at rest. No dyspnea on exertion, No excess mucus, no productive cough, No , No coughing up of blood.No change in color of mucus.No wheezing. Skin:  no  or lesions. No jaundice GU:  no dysuria, change in color of urine, no urgency or frequency. No straining to urinate.  No flank pain.  Musculoskeletal:  No joint pain or no joint swelling. No decreased range of motion. No back pain.  Psych:  No change in mood or affect. No depression or anxiety. No memory loss.  Neuro: no localizing neurological complaints, no tingling, no weakness, no double vision, no gait abnormality, no slurred speech, no confusion  Otherwise ROS are negative except for above, 10 systems were reviewed  Past Medical History: Past Medical History  Diagnosis Date  . Hyperlipemia   . Chronic back pain   . GERD (gastroesophageal reflux disease)   . Anxiety   . Chronic narcotic dependence   . Hypothyroidism   . Depression   . Myoclonus   . UTI (lower urinary tract infection) 5/14    was confused and was found  in [place].  . Acute encephalopathy 09/06/12    was admitted to [facility]  . Foot drop, bilateral   . Shortness of breath     with exertion  . CHF (congestive heart failure)     diurects stopped in April  . DVT (deep venous thrombosis)     01/2012  . Seizures     Myoclonus  . Neuropathy     legs  . Arthritis   . Memory  loss of unknown cause   . AVN (avascular necrosis of bone)     right   Past Surgical History  Procedure Laterality Date  . Back surgery      4 lumbar  . Hip surgery Left   . Cervical fusion    . Knee arthroscopy Right   . Joint replacement    . Pain pump implantation  Reports no longer has pump  . Tooth extraction N/A 11/16/2012    Procedure: DENTAL EXTRACTIONS OF MULTIPLE NONRESTORABLE TEETH;  Surgeon: Hinton Dyer, DDS;  Location: [facility] OR;  Service: Oral Surgery;  Laterality: N/A;  #12, 14, 15, 22, 23, 24, 25, 26, 27, 28     Medications: Prior to Admission medications   Medication Sig Start Date End Date Taking? Authorizing Provider  carisoprodol (SOMA) 350 MG tablet Take 350 mg by mouth 3 (three) times daily as needed for muscle spasms.    Yes Historical Provider, MD  diazepam (VALIUM) 2 MG tablet Take 2 mg by mouth every 6 (six) hours as needed for anxiety.   Yes Historical Provider, MD  DULoxetine (CYMBALTA) 60 MG capsule Take 120 mg by mouth every morning.    Yes Historical Provider, MD  Eszopiclone (ESZOPICLONE) 3 MG TABS Take 3 mg by mouth at bedtime. Take immediately before bedtime   Yes Historical Provider, MD  ezetimibe (ZETIA) 10 MG tablet Take 10 mg by mouth daily.   Yes Historical Provider, MD  Multiple Vitamin (MULTIVITAMIN WITH MINERALS) TABS tablet Take 1 tablet by mouth every morning.    Yes Historical Provider, MD  omeprazole (PRILOSEC) 20 MG capsule Take 20 mg by mouth every morning.    Yes Historical Provider, MD  oxyCODONE-acetaminophen (PERCOCET) 5-325 MG per tablet Take 2 tablets by mouth every 6 (six) hours as needed for pain. 02/10/13  Yes Hurman Horn, MD  propranolol (INDERAL) 20 MG tablet Take 20 mg by mouth 2 (two) times daily.   Yes Historical Provider, MD  temazepam (RESTORIL) 15 MG capsule Take 15 mg by mouth at bedtime.   Yes Historical Provider, MD    Allergies:   Allergies  Allergen Reactions  . Ceftin [Cefuroxime Axetil] Hives  . Statins  Other (See Comments)    Messes with liver enzymes    Social History:  Ambulatory walker  Lives at   Home with wife   reports that he has never smoked. He has never used smokeless tobacco. He reports that he does not drink alcohol or use illicit drugs.   Family History: family history includes Breast cancer in his sister; CAD in his brother and father; Heart failure in his mother.    Physical Exam: Patient Vitals for the past 24 hrs:  BP Temp Temp src Pulse Resp SpO2 Height Weight  02/11/13 2314 - - - - - - 5\' 10"  (1.778 m) 113.399 kg (250 lb)  02/11/13 2313 146/84 mmHg 98.1 F (36.7 C) Oral 75 20 95 % - -    1. General:  in No Acute distress 2. Psychological:  Alert and   Oriented 3. Head/ENT:   Moist  Mucous Membranes                          Head Non traumatic, neck supple                          Normal   Dentition 4. SKIN: normal  Skin turgor,  Skin clean Dry and intact, slight erythema of the back breasts and 5. Heart: Regular rate and rhythm no Murmur, Rub or gallop 6. Lungs: Clear to auscultation bilaterally, no wheezes or crackles   7. Abdomen: Soft, non-tender, Non distended, obese 8. Lower extremities: no clubbing, cyanosis, or edema obese 9. Neurologically:  foot drop noted cranial nerves appear to be intact  10. MSK: Normal range of motion  body mass index is 35.87 kg/(m^2).   Labs on Admission:   Recent Labs  02/10/13 1645 02/11/13 2350  NA 140 139  K 2.9* 3.0*  CL 103 106  CO2 25 23  GLUCOSE 115* 100*  BUN 11 11  CREATININE 0.69 0.69  CALCIUM 9.2 8.9    Recent Labs  02/10/13 1645 02/11/13 2350  AST 32 25  ALT 17 17  ALKPHOS 117 101  BILITOT 1.1 0.7  PROT 7.7 6.6  ALBUMIN 4.0 3.5   No results found for this basename: LIPASE, AMYLASE,  in the last 72 hours  Recent Labs  02/10/13 1645 02/11/13 2350  WBC 12.6* 10.5  NEUTROABS 8.5* 6.0  HGB 15.2 13.7  HCT 43.8 40.3  MCV 83.3 83.6  PLT 255 238    Recent Labs  02/10/13 1645   CKTOTAL 375*   No results found for this basename: TSH, T4TOTAL, FREET3, T3FREE, THYROIDAB,  in the last 72 hours No results found for this basename: VITAMINB12, FOLATE, FERRITIN, TIBC, IRON, RETICCTPCT,  in the last 72 hours No results found for this basename: HGBA1C    Estimated Creatinine Clearance: 125.4 ml/min (by C-G formula based on Cr of 0.69). ABG    Component Value Date/Time   TCO2 29 11/08/2012 0151     No results found for this basename: DDIMER     Cultures:    Component Value Date/Time   SDES BLOOD RIGHT ARM 02/10/2013 1645   SPECREQUEST BOTTLES DRAWN AEROBIC AND ANAEROBIC 10CC 02/10/2013 1645   CULT  Value: GRAM POSITIVE COCCI IN CLUSTERS Note: Gram Stain Report Called to,Read Back By and Verified With: PATTY CLARK 02/11/2013 2152PM MITCV Performed at [facility] 02/10/2013 1645   REPTSTATUS PENDING 02/10/2013 1645       Radiological Exams on Admission: Dg Chest 2 View  02/10/2013   CLINICAL DATA:  Chest pain  EXAM: CHEST  2 VIEW  COMPARISON:  01/07/2013  FINDINGS: The heart size and mediastinal contours are within normal limits. Both lungs are clear. The visualized skeletal structures are unremarkable.  IMPRESSION: No active cardiopulmonary disease.   Electronically Signed   By: Christiana Pellant M.D.   On: 02/10/2013 17:32   Ct Head Wo Contrast  02/10/2013   CLINICAL DATA:  Narcotic withdrawals  EXAM: CT HEAD WITHOUT CONTRAST  TECHNIQUE: Contiguous axial images were obtained from the base of the skull through the vertex without intravenous contrast.  COMPARISON:  09/06/2012  FINDINGS: The bony calvarium is intact. The ventricles and cortical sulci are mildly prominent consistent with mild atrophy. No findings to suggest acute hemorrhage, acute infarction or space-occupying mass  lesion are noted.  IMPRESSION: Mild atrophic changes without acute abnormality.   Electronically Signed   By: Alcide Clever M.D.   On: 02/10/2013 17:25   Dg Chest Port 1 View  02/12/2013    *RADIOLOGY REPORT*  Clinical Data: Cough.  PORTABLE CHEST - 1 VIEW  Comparison: Chest radiograph from 10/01 of 1014  Findings: The lungs are relatively well-aerated.  Vascular congestion is noted.  There is no evidence of focal opacification, pleural effusion or pneumothorax.  The cardiomediastinal silhouette is borderline normal in size.  No acute osseous abnormalities are seen.  IMPRESSION: Vascular congestion noted; lungs remain grossly clear.   Original Report Authenticated By: Tonia Ghent, M.D.    Chart has been reviewed  Assessment/Plan  This is a 62 year old gentleman a history of chronic pain who presents with diarrhea nausea likely due to opioid withdrawal. He had an incidental finding of gram-positive cocci in his blood culture one out of two. Patient otherwise is asymptomatic.  Present on Admission:  . Chronic pain - will restart his pain medications. In the morning could contact his pain clinic in order to establish a plan of care  . Hypokalemia - will replace  . Bacteremia due to Gram-positive bacteria - patient have been started on vancomycin but did not tolerate this. At this point he is afebrile, no signs of hemodynamic instability or infection. We'll observe overnight until results of blood cultures back. If  Contaminant patient could be discharged to home. Repeat blood cultures has been done.   . Diarrhea - tachycardia secondary to opiate withdrawal currently resolved.   Prophylaxis:   Lovenox, Protonix  CODE STATUS: DNR/DNI per patient   Other plan as per orders.  I have spent a total of 65 min on this admission  Hollie Wojahn 02/12/2013, 1:30 AM

## 2013-02-12 NOTE — Progress Notes (Signed)
I was present during the Code Blue with Maren Reamer, NP. Agree with the above assessment of overnight events. Suspect possible seizure activity related to benzo withdrawal. Pt has since returned to near baseline and has been transferred to [facility] for closer monitoring. Agree with MRI and EEG.

## 2013-02-12 NOTE — Progress Notes (Signed)
During report in room near patient's room I heard noise in his room.  Went in to assess and pt having seizure like activity but I was able to redirect him and get him to make eye contact.  Pt stopped after a minute and came back around to talking.  Pt explained he had myoclonic seizure like activity for years.  Explained to pt I would be his nurse tonight but needed to get report on him first. Continued report on another pt in their room near by when heard pt yell very loudly.  Pt was posturing (decerebrate)  and foaming at the mouth, eyes deviated to left and suddenly fell limp and quickly turned blue.  I could not obtain a carotid pulse, chest compressions started.  His day shift nurse called a code but by the time code team arrived pt had began to have a pulse and was breathing on his own.  Suctioned and took VS, NP on scene. Barnett Hatter P

## 2013-02-12 NOTE — ED Provider Notes (Signed)
CSN: [id number]     Arrival date & time 02/11/13  2304 History   First MD Initiated Contact with Patient 02/11/13 2325     No chief complaint on file. withdrawal, chronic pain, out of meds, positive blood culture  PCP: [facility]  (Consider location/radiation/quality/duration/timing/severity/associated sxs/prior Treatment) HPI 62yo male at baseline has chronic severe low back pain, chronic unchanged weakness both legs and feet with foot drop bilaterally, poor memory, generalized weakness, tremors, chronic narcotics, chronic benzodiazepine use, difficulty filling controlled substance prescriptions so doesn't use them as directed, ran out of narcotics and benzos several days ago, presented to [facility] ED yesterday with generalized shakes, worse than baseline severe back pain, and several days diarrhea, initially wife not present, so possible sepsis evaluation initiated, when wife arrived and through ED course, appeared infection unlikely, Sxs much better when re-started narcotics in ED, back pain back to baseline, tremors resolved, had been no fever, had cough uncertain time but no PNA on CXR, discharged with Rx for short-term Percocet over weekend until Pt could contact pain mgmt Monday, today pharmacy refused to fill Rx for Percocet, Pt developed recurrent withdrawal Sxs with shakes, tremors, dry heaving, diarrhea, recurrent worse than usual severe pain, also one blod culture bottle growing gpc in clusters uncertain significance, so Pt back to ED, still no fever, Pt has baseline poor memory, currently oriented x 3, no headache/CP/SOB/abd pain or new focal weak/numb or change bowel/bladder function, Pt feels like in withdrawal again, states 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 Pt has used a walker for  years. Past Medical History  Diagnosis Date  . Hyperlipemia   . Chronic back pain   . GERD (gastroesophageal reflux disease)   . Anxiety   . Chronic narcotic dependence   . Hypothyroidism   . Depression   . Myoclonus   . UTI (lower urinary tract infection) 5/14    was confused and was found in [place].  Marland Kitchen Acute encephalopathy 09/06/12    was admitted to [facility]  . Foot drop, bilateral   . Shortness of breath     with exertion  . CHF (congestive heart failure)     diurects stopped in April  . DVT (deep venous thrombosis)     01/2012  . Seizures     Myoclonus  . Neuropathy     legs  . Arthritis   . Memory loss of unknown cause   . AVN (avascular necrosis of bone)     right   Past Surgical History  Procedure Laterality Date  . Back surgery      4 lumbar  . Hip surgery Left   . Cervical fusion    . Knee arthroscopy Right   . Joint replacement    . Pain pump implantation  Reports no longer has pump  . Tooth extraction N/A 11/16/2012    Procedure: DENTAL EXTRACTIONS OF MULTIPLE NONRESTORABLE TEETH;  Surgeon: Hinton Dyer, DDS;  Location: [facility] OR;  Service: Oral Surgery;  Laterality: N/A;  #12, 14, 15, 22, 23, 24, 25, 26, 27, 28   Family History  Problem Relation Age of Onset  . CAD Father   . Heart failure Mother   . Breast cancer Sister   . CAD Brother    History  Substance  Use Topics  . Smoking status: Never Smoker   . Smokeless tobacco: Never Used  . Alcohol Use: No    Review of Systems 10 Systems reviewed and are negative for acute change except as noted in the HPI. Allergies  Ceftin and Statins  Home Medications   Current Outpatient Rx  Name  Route  Sig  Dispense  Refill  . DULoxetine (CYMBALTA) 60 MG capsule   Oral   Take 120 mg by mouth every morning.          . Eszopiclone (ESZOPICLONE) 3 MG TABS   Oral   Take 3 mg by mouth at bedtime. Take immediately before bedtime         . ezetimibe (ZETIA) 10 MG tablet   Oral   Take 10 mg by mouth daily.          . Multiple Vitamin (MULTIVITAMIN WITH MINERALS) TABS tablet   Oral   Take 1 tablet by mouth every morning.          Marland Kitchen omeprazole (PRILOSEC) 20 MG capsule   Oral   Take 20 mg by mouth every morning.          . propranolol (INDERAL) 20 MG tablet   Oral   Take 20 mg by mouth 2 (two) times daily.         . carisoprodol (SOMA) 350 MG tablet   Oral   Take 1 tablet (350 mg total) by mouth 3 (three) times daily as needed for muscle spasms.   30 tablet   0   . diazepam (VALIUM) 2 MG tablet   Oral   Take 1 tablet (2 mg total) by mouth every 8 (eight) hours as needed for anxiety.   30 tablet   0   . levETIRAcetam (KEPPRA) 500 MG tablet   Oral   Take 1 tablet (500 mg total) by mouth every 12 (twelve) hours.   60 tablet   1   . oxyCODONE-acetaminophen (PERCOCET) 5-325 MG per tablet   Oral   Take 2 tablets by mouth every 6 (six) hours as needed for pain.   50 tablet   0   . potassium chloride (K-DUR) 10 MEQ tablet   Oral   Take 1 tablet (10 mEq total) by mouth 2 (two) times daily.   30 tablet   0   . temazepam (RESTORIL) 15 MG capsule   Oral   Take 1 capsule (15 mg total) by mouth at bedtime.   10 capsule   0    BP 142/84  Pulse 91  Temp(Src) 98.3 F (36.8 C) (Oral)  Resp 18  Ht 5\' 10"  (1.778 m)  Wt 250 lb (113.4 kg)  BMI 35.87 kg/m2  SpO2 98% Physical Exam  Nursing note and vitals reviewed. Constitutional: He is oriented to person, place, and time.  Awake, alert, nontoxic appearance, but generalized tremors/shakes all 4 extremities intermittently  HENT:  Head: Atraumatic.  Eyes: Right eye exhibits no discharge. Left eye exhibits no discharge.  Neck: Neck supple.  Cardiovascular: Normal rate and regular rhythm.   No murmur heard. Pulmonary/Chest: Effort normal and breath sounds normal. No respiratory distress. He has no wheezes. He has no rales. He exhibits no tenderness.  Abdominal: Soft. Bowel sounds are normal. He exhibits no distension and no  mass. There is no tenderness. There is no rebound and no guarding.  Musculoskeletal: He exhibits tenderness. He exhibits no edema.  Baseline ROM, no obvious new focal weakness. Diffuse lumbar tenderness without  rash; both feet with intact dorsalis pedis pulses and CR<2 seconds toes, baseline light touch both legs and feet with baseline slight numbness anterior thighs, otherwise normal light touch to legs, baseline 4/5 motor both legs hips, quads, foot dorsiflexion and plantarflexion  Neurological: He is alert and oriented to person, place, and time.  Mental status and motor strength appears baseline for patient and situation.  Skin: No rash noted.  Psychiatric: He has a normal mood and affect.    ED Course  Procedures (including critical care time) Patient / Family / Caregiver understand and agree with initial ED impression and plan with expectations set for ED visit. Suspect Sxs from withdrawal including dry heaves and diarrhea, but uncertain significance of positive blood culture 1/4 bottles, PMH of spinal region infection, feel repeat labs, empiric antibiotics, request for Obs reasonable to consider MRI in AM and contact with Pt's pain mgmt clinic to review meds.  D/w [facility] to see Pt in ED. 0100  Labs Review Labs Reviewed  COMPREHENSIVE METABOLIC PANEL - Abnormal; Notable for the following:    Potassium 3.0 (*)    Glucose, Bld 100 (*)    All other components within normal limits  COMPREHENSIVE METABOLIC PANEL - Abnormal; Notable for the following:    Potassium 3.2 (*)    Albumin 3.4 (*)    All other components within normal limits  GLUCOSE, CAPILLARY - Abnormal; Notable for the following:    Glucose-Capillary 108 (*)    All other components within normal limits  COMPREHENSIVE METABOLIC PANEL - Abnormal; Notable for the following:    Potassium 3.2 (*)    Glucose, Bld 109 (*)    All other components within normal limits  COMPREHENSIVE METABOLIC PANEL - Abnormal; Notable for the  following:    Potassium 3.1 (*)    Glucose, Bld 126 (*)    All other components within normal limits  BASIC METABOLIC PANEL - Abnormal; Notable for the following:    Potassium 3.3 (*)    Glucose, Bld 111 (*)    All other components within normal limits  CULTURE, BLOOD (ROUTINE X 2)  CULTURE, BLOOD (ROUTINE X 2)  URINE CULTURE  MRSA PCR SCREENING  CBC WITH DIFFERENTIAL  URINALYSIS, ROUTINE W REFLEX MICROSCOPIC  MAGNESIUM  PHOSPHORUS  TSH  CBC  TROPONIN I  TROPONIN I  TROPONIN I  CBC WITH DIFFERENTIAL  PROLACTIN  CBC WITH DIFFERENTIAL  CG4 I-STAT (LACTIC ACID)   Imaging Review No results found.  MDM   1. Withdrawal from opioids   2. Chronic back pain   3. Positive blood culture   4. Chronic pain   5. Diarrhea   6. Convulsions/seizures     Dispo pending.    Hurman Horn, MD 02/15/13 2142

## 2013-02-13 DIAGNOSIS — R569 Unspecified convulsions: Secondary | ICD-10-CM

## 2013-02-13 DIAGNOSIS — E876 Hypokalemia: Secondary | ICD-10-CM

## 2013-02-13 DIAGNOSIS — G8929 Other chronic pain: Secondary | ICD-10-CM

## 2013-02-13 DIAGNOSIS — R197 Diarrhea, unspecified: Secondary | ICD-10-CM

## 2013-02-13 LAB — COMPREHENSIVE METABOLIC PANEL
AST: 21 U/L (ref 0–37)
Albumin: 3.5 g/dL (ref 3.5–5.2)
Alkaline Phosphatase: 106 U/L (ref 39–117)
BUN: 6 mg/dL (ref 6–23)
Calcium: 8.9 mg/dL (ref 8.4–10.5)
Creatinine, Ser: 0.66 mg/dL (ref 0.50–1.35)
GFR calc Af Amer: >90 mL/min (ref >90)
Glucose, Bld: 126 mg/dL — ABNORMAL HIGH (ref 70–99)
Potassium: 3.1 mEq/L — ABNORMAL LOW (ref 3.5–5.1)
Total Protein: 6.8 g/dL (ref 6.0–8.3)

## 2013-02-13 LAB — CBC WITH DIFFERENTIAL/PLATELET
Eosinophils Absolute: 0.1 10*3/uL (ref 0.0–0.7)
Eosinophils Relative: 1 % (ref 0–5)
HCT: 39.9 % (ref 39.0–52.0)
Hemoglobin: 14.1 g/dL (ref 13.0–17.0)
Lymphs Abs: 2.4 10*3/uL (ref 0.7–4.0)
MCH: 29.4 pg (ref 26.0–34.0)
MCHC: 35.3 g/dL (ref 30.0–36.0)
MCV: 83.3 fL (ref 78.0–100.0)
Monocytes Absolute: 0.7 10*3/uL (ref 0.1–1.0)
Monocytes Relative: 7 % (ref 3–12)
Neutrophils Relative %: 69 % (ref 43–77)
RBC: 4.79 MIL/uL (ref 4.22–5.81)

## 2013-02-13 LAB — URINE CULTURE
Colony Count: NO GROWTH
Culture: NO GROWTH

## 2013-02-13 MED ORDER — POTASSIUM CHLORIDE CRYS ER 20 MEQ PO TBCR
20.0000 meq | EXTENDED_RELEASE_TABLET | Freq: Once | ORAL | Status: AC
  Administered 2013-02-13: 20 meq via ORAL
  Filled 2013-02-13: qty 1

## 2013-02-13 MED ORDER — SODIUM CHLORIDE 0.9 % IV SOLN: INTRAVENOUS | Status: DC

## 2013-02-13 MED ORDER — BIOTENE DRY MOUTH MT LIQD
15.0000 mL | Freq: Two times a day (BID) | OROMUCOSAL | Status: DC
  Administered 2013-02-13 – 2013-02-15 (×5): 15 mL via OROMUCOSAL
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
## 2013-02-13 NOTE — Progress Notes (Signed)
Alex Mcintosh:[id number] DOB: [birth year] DOA: 02/11/2013 PCP: Carolynn Serve, NP  Brief narrative: 62 y/o CM, known multiple medical issues, presented to [facility] ED with Acute drug withdrawal from Opiates and a concern for 1/2 Blood cultures being + for Gram + cocci.  He was actually told to go to the Ed. 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/10 L hip Osteo, myoclonic sxz  Consultants:  Neurology  Procedures:  EEG pending  Antibiotics:  Zosyn 10/5  Ancef 10/6   Subjective  Alert pleasant Remembers me but poor recollection of other events yesterday Chest sore form Chest compressions-requests FULL CODE status   Objective    Interim History: none  Telemetry: Sinus brady  Objective: Filed Vitals:   02/13/13 0900 02/13/13 1000 02/13/13 1100 02/13/13 1200  BP: 149/66 154/71 152/102 158/84  Pulse: 51 88 68 65  Temp:      TempSrc:      Resp: 20 17 14 19   Height:      Weight:      SpO2: 90% 98% 93% 98%    Intake/Output Summary (Last 24 hours) at 02/13/13 1225 Last data filed at 02/13/13 1200  Gross per 24 hour  Intake   2675 ml  Output   4800 ml  Net  -2125 ml    Exam:  HEENT-eomi ncat peerla  CHEST-clear, no added sound  CARDIAC-s1 s 2no m/r/g  ABDOMEN-soft nt, nd  NEURO-intact   Data Reviewed: Basic Metabolic Panel:  Recent Labs Lab 02/10/13 1645 02/11/13 2350 02/12/13 0535 02/12/13 2200  NA 140 139 142  141  K 2.9* 3.0* 3.2* 3.2*  CL 103 106 109 105  CO2 25 23 24 27   GLUCOSE 115* 100* 98 109*  BUN 11 11 9 9   CREATININE 0.69 0.69 0.75 0.80  CALCIUM 9.2 8.9 8.7 8.7  MG  --   --  1.9  --   PHOS  --   --  3.4  --    Liver Function Tests:  Recent Labs Lab 02/10/13 1645 02/11/13 2350 02/12/13 0535 02/12/13 2200  AST 32 25 25 29   ALT 17 17 15 21   ALKPHOS 117 101 96 105  BILITOT 1.1 0.7 0.7 0.5  PROT 7.7 6.6 6.4 6.9  ALBUMIN 4.0 3.5 3.4* 3.6   No results found for this basename: LIPASE, AMYLASE,  in the last 168 hours  Recent Labs Lab 02/10/13 1642  AMMONIA 28   CBC:  Recent Labs Lab 02/10/13 1645 02/11/13 2350 02/12/13 0535 02/12/13 2200  WBC 12.6* 10.5 9.5 9.6  NEUTROABS 8.5* 6.0  --  7.1  HGB 15.2 13.7 13.4 14.4  HCT 43.8 40.3 39.8 42.1  MCV 83.3 83.6 84.9 84.0  PLT 255 238 209 219   Cardiac Enzymes:  Recent Labs Lab 02/10/13 1645 02/12/13 1558 02/12/13 2200 02/13/13 0328  CKTOTAL 375*  --   --   --   TROPONINI  --  <0.30 <0.30 <0.30   BNP: No  components found with this basename: POCBNP,  CBG:  Recent Labs Lab 02/12/13 1644  GLUCAP 108*    Recent Results (from the past 240 hour(s))  CULTURE, BLOOD (ROUTINE X 2)     Status: None   Collection Time    02/10/13  4:40 PM      Result Value Range Status   Specimen Description BLOOD LEFT WRIST   Final   Special Requests BOTTLES DRAWN AEROBIC AND ANAEROBIC 10CC   Final   Culture  Setup Time     Final   Value: 02/11/2013 02:34     Performed at [facility]   Culture     Final   Value:        BLOOD CULTURE RECEIVED NO GROWTH TO DATE CULTURE WILL BE HELD FOR 5 DAYS BEFORE ISSUING A FINAL NEGATIVE REPORT     Performed at [facility]   Report Status PENDING   Incomplete  CULTURE, BLOOD (ROUTINE X 2)     Status: None   Collection Time    02/10/13  4:45 PM      Result Value Range Status   Specimen Description BLOOD RIGHT ARM   Final   Special Requests BOTTLES DRAWN AEROBIC AND  ANAEROBIC 10CC   Final   Culture  Setup Time     Final   Value: 02/11/2013 02:34     Performed at [facility]   Culture     Final   Value: GRAM POSITIVE COCCI IN CLUSTERS     Note: Gram Stain Report Called to,Read Back By and Verified With: PATTY CLARK 02/11/2013 2152PM MITCV     Performed at [facility]   Report Status PENDING   Incomplete  URINE CULTURE     Status: None   Collection Time    02/10/13  7:58 PM      Result Value Range Status   Specimen Description URINE, CATHETERIZED   Final   Special Requests NONE   Final   Culture  Setup Time     Final   Value: 02/11/2013 03:03     Performed at [facility] Count     Final   Value: NO GROWTH     Performed at [facility]   Culture     Final   Value: NO GROWTH     Performed at [facility]   Report Status 02/12/2013 FINAL   Final  CULTURE, BLOOD (ROUTINE X 2)     Status: None   Collection Time    02/11/13 11:50 PM      Result Value Range Status   Specimen Description BLOOD RIGHT HAND   Final   Special Requests BOTTLES DRAWN AEROBIC AND ANAEROBIC 5CC EACH   Final   Culture  Setup Time     Final   Value: 02/12/2013 09:30     Performed at [facility]   Culture     Final   Value:        BLOOD CULTURE RECEIVED NO GROWTH TO DATE CULTURE WILL BE HELD FOR 5 DAYS BEFORE ISSUING A FINAL NEGATIVE REPORT     Performed at [facility]   Report Status PENDING   Incomplete  CULTURE, BLOOD (ROUTINE X 2)     Status: None   Collection Time    02/12/13 12:52 AM      Result Value Range Status   Specimen Description BLOOD LEFT ANTECUBITAL   Final   Special  Requests BOTTLES DRAWN AEROBIC AND ANAEROBIC 10CC   Final   Culture  Setup Time     Final   Value: 02/12/2013 09:30     Performed at [facility]   Culture     Final   Value:        BLOOD CULTURE RECEIVED NO GROWTH TO DATE CULTURE WILL BE HELD FOR 5 DAYS BEFORE ISSUING A FINAL NEGATIVE REPORT     Performed at  [facility]   Report Status PENDING   Incomplete  URINE CULTURE     Status: None   Collection Time    02/12/13  5:59 AM      Result Value Range Status   Specimen Description URINE, CATHETERIZED   Final   Special Requests NONE   Final   Culture  Setup Time     Final   Value: 02/12/2013 09:49     Performed at [facility] Count     Final   Value: NO GROWTH     Performed at [facility]   Culture     Final   Value: NO GROWTH     Performed at [facility]   Report Status 02/13/2013 FINAL   Final  MRSA PCR SCREENING     Status: None   Collection Time    02/12/13  7:59 PM      Result Value Range Status   MRSA by PCR NEGATIVE  NEGATIVE Final   Comment:            The GeneXpert MRSA Assay (FDA     approved for NASAL specimens     only), is one component of a     comprehensive MRSA colonization     surveillance program. It is not     intended to diagnose MRSA     infection nor to guide or     monitor treatment for     MRSA infections.     Studies:              All Imaging reviewed and is as per above notation   Scheduled Meds: . antiseptic oral rinse  15 mL Mouth Rinse BID  . DULoxetine  120 mg Oral q morning - 10a  . enoxaparin (LOVENOX) injection  40 mg Subcutaneous Q24H  . ezetimibe  10 mg Oral Daily  . pantoprazole  40 mg Oral Daily  . propranolol  20 mg Oral BID  . temazepam  15 mg Oral QHS   Continuous Infusions:    Assessment/Plan: 1. Seizures-Per Neurology started Keppra-appreciate input-await EEG 2. 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 Chronic pain-Per Dr. Genella Mech in [place]. 4. Anxiety/Bipolar-continue Temazepam 15 qhs, Cymbalta 120 daily  Code Status: FUll Family Communication: none at bedside- Disposition Plan: SDU   Pleas Koch, MD  [facility] Pager [phone] 02/13/2013, 12:25 PM    LOS: 2 days

## 2013-02-14 ENCOUNTER — Inpatient Hospital Stay (HOSPITAL_COMMUNITY)
Admit: 2013-02-14 | Discharge: 2013-02-14 | Disposition: A | Discharge status: Home / Self Care | Payer: Medicare Other | Attending: Family Medicine | Admitting: Family Medicine

## 2013-02-14 LAB — CULTURE, BLOOD (ROUTINE X 2)

## 2013-02-14 MED ORDER — POTASSIUM CHLORIDE CRYS ER 20 MEQ PO TBCR
40.0000 meq | EXTENDED_RELEASE_TABLET | Freq: Once | ORAL | Status: AC
  Administered 2013-02-14: 40 meq via ORAL
  Filled 2013-02-14: qty 2

## 2013-02-14 NOTE — Progress Notes (Signed)
EEG Completed; Results Pending  

## 2013-02-14 NOTE — Progress Notes (Signed)
Pt had Foley removed PTA to room 1444 from ICU.  Still awaiting void.  Pt has consumed fluids with dinner and water at bedside.  At this time, pt is going to bathroom to "give it a try".

## 2013-02-14 NOTE — Progress Notes (Signed)
[facility] PROGRESS NOTE  Alex Mcintosh:[id number] DOB: [birth year] DOA: 02/11/2013 PCP: Carolynn Serve, NP  HPI: 62 y/o Alex Mcintosh, known multiple medical issues, presented to [facility] ED with Acute drug withdrawal from Opiates and a concern for 1/2 Blood cultures being + for Gram + cocci. He was actually told to go to the Ed.  He had "tremor" on day of admit and then had a frank seizure leading to his being transferred to SDU 02/12/13 and Neurology was consulted for recommendations  His BC from 02/10/13 are still pendings, but Prlim report per Lab was this was likely Coag neg staph, and abx d/c  Assessment/Plan:  Seizure - EEG pending. Appreciate Neuro input.  Coag negative staph in one culture. Final report pending. Stable off of Abx. Monitor. Repeat blood cultures negative.  Chronic pain - per Dr. Genella Mech in [facility]. Anxiety/Bipolar - continue Temazepam and Cymbalta.  DVT Prophylaxis - Lovenox  Code Status: Full Family Communication: none  Disposition Plan: inpatient, floor transfer today.   Consultants:  Neurology  Procedures:  none  Anti-infectives   Start     Dose/Rate Route Frequency Ordered Stop   02/12/13 1000  ceFAZolin (ANCEF) IVPB 2 g/50 mL premix  Status:  Discontinued     2 g 100 mL/hr over 30 Minutes Intravenous Every 8 hours 02/12/13 0912 02/13/13 1232   02/11/13 2345  piperacillin-tazobactam (ZOSYN) IVPB 3.375 g     3.375 g 100 mL/hr over 30 Minutes Intravenous  Once 02/11/13 2341 02/12/13 0035   02/11/13 2345  vancomycin (VANCOCIN) IVPB 1000 mg/200 mL premix     1,000 mg 200 mL/hr over 60 Minutes Intravenous  Once 02/11/13 2341 02/12/13 0125     Antibiotics Given (last 72 hours)   Date/Time Action Medication Dose Rate   02/12/13 0953 Given   ceFAZolin (ANCEF) IVPB 2 g/50 mL premix 2 g 100 mL/hr   02/12/13 1848 Given   ceFAZolin (ANCEF) IVPB 2 g/50 mL premix 2 g 100 mL/hr   02/13/13 0150 Given   ceFAZolin (ANCEF) IVPB 2 g/50 mL premix 2 g 100  mL/hr   02/13/13 0932 Given   ceFAZolin (ANCEF) IVPB 2 g/50 mL premix 2 g 100 mL/hr     HPI/Subjective: - feeling well, sleepy.  Objective: Filed Vitals:   02/14/13 0200 02/14/13 0300 02/14/13 0400 02/14/13 0600  BP: 149/75 162/63 139/60   Pulse: 58 52 62   Temp:   98.8 F (37.1 C)   TempSrc:   Oral   Resp: 28 24 16 19   Height:      Weight:    113.4 kg (250 lb)  SpO2: 99% 97% 97% 100%    Intake/Output Summary (Last 24 hours) at 02/14/13 0720 Last data filed at 02/14/13 0600  Gross per 24 hour  Intake    650 ml  Output   2725 ml  Net  -2075 ml   Filed Weights   02/11/13 2314 02/12/13 2000 02/14/13 0600  Weight: 113.399 kg (250 lb) 125.2 kg (276 lb 0.3 oz) 113.4 kg (250 lb)   Exam:  General:  NAD  Cardiovascular: regular rate and rhythm, without MRG  Respiratory: good air movement, clear to auscultation throughout, no wheezing, ronchi or rales  Abdomen: soft, not tender to palpation, positive bowel sounds  MSK: no peripheral edema  Neuro: non focal  Data Reviewed: Basic Metabolic Panel:  Recent Labs Lab 02/10/13 1645 02/11/13 2350 02/12/13 0535 02/12/13 2200 02/13/13 1304  NA 140 139 142 141 140  K 2.9* 3.0* 3.2* 3.2* 3.1*  CL 103 106 109 105 103  CO2 25 23 24 27 26   GLUCOSE 115* 100* 98 109* 126*  BUN 11 11 9 9 6   CREATININE 0.69 0.69 0.75 0.80 0.66  CALCIUM 9.2 8.9 8.7 8.7 8.9  MG  --   --  1.9  --   --   PHOS  --   --  3.4  --   --    Liver Function Tests:  Recent Labs Lab 02/10/13 1645 02/11/13 2350 02/12/13 0535 02/12/13 2200 02/13/13 1304  AST 32 25 25 29 21   ALT 17 17 15 21 16   ALKPHOS 117 101 96 105 106  BILITOT 1.1 0.7 0.7 0.5 0.5  PROT 7.7 6.6 6.4 6.9 6.8  ALBUMIN 4.0 3.5 3.4* 3.6 3.5    Recent Labs Lab 02/10/13 1642  AMMONIA 28   CBC:  Recent Labs Lab 02/10/13 1645 02/11/13 2350 02/12/13 0535 02/12/13 2200 02/13/13 1304  WBC 12.6* 10.5 9.5 9.6 10.2  NEUTROABS 8.5* 6.0  --  7.1 7.0  HGB 15.2 13.7 13.4 14.4  14.1  HCT 43.8 40.3 39.8 42.1 39.9  MCV 83.3 83.6 84.9 84.0 83.3  PLT 255 238 209 219 211   Cardiac Enzymes:  Recent Labs Lab 02/10/13 1645 02/12/13 1558 02/12/13 2200 02/13/13 0328  CKTOTAL 375*  --   --   --   TROPONINI  --  <0.30 <0.30 <0.30   BNP (last 3 results)  Recent Labs  06/13/12 1550  PROBNP 120.4   CBG:  Recent Labs Lab 02/12/13 1644  GLUCAP 108*   Recent Results (from the past 240 hour(s))  CULTURE, BLOOD (ROUTINE X 2)     Status: None   Collection Time    02/10/13  4:40 PM      Result Value Range Status   Specimen Description BLOOD LEFT WRIST   Final   Special Requests BOTTLES DRAWN AEROBIC AND ANAEROBIC 10CC   Final   Culture  Setup Time     Final   Value: 02/11/2013 02:34     Performed at [facility]   Culture     Final   Value:        BLOOD CULTURE RECEIVED NO GROWTH TO DATE CULTURE WILL BE HELD FOR 5 DAYS BEFORE ISSUING A FINAL NEGATIVE REPORT     Performed at [facility]   Report Status PENDING   Incomplete  CULTURE, BLOOD (ROUTINE X 2)     Status: None   Collection Time    02/10/13  4:45 PM      Result Value Range Status   Specimen Description BLOOD RIGHT ARM   Final   Special Requests BOTTLES DRAWN AEROBIC AND ANAEROBIC 10CC   Final   Culture  Setup Time     Final   Value: 02/11/2013 02:34     Performed at [facility]   Culture     Final   Value: GRAM POSITIVE COCCI IN CLUSTERS     Note: Gram Stain Report Called to,Read Back By and Verified With: Steward Ros 02/11/2013 2152PM [facility]     Performed at [facility]   Report Status PENDING   Incomplete  URINE CULTURE     Status: None   Collection Time    02/10/13  7:58 PM      Result Value Range Status   Specimen Description URINE, CATHETERIZED   Final   Special Requests NONE   Final  Culture  Setup Time     Final   Value: 02/11/2013 03:03     Performed at [facility] Count     Final   Value: NO GROWTH     Performed at [facility]   Culture     Final   Value: NO GROWTH     Performed at [facility]   Report Status 02/12/2013 FINAL   Final  CULTURE, BLOOD (ROUTINE X 2)     Status: None   Collection Time    02/11/13 11:50 PM      Result Value Range Status   Specimen Description BLOOD RIGHT HAND   Final   Special Requests BOTTLES DRAWN AEROBIC AND ANAEROBIC 5CC EACH   Final   Culture  Setup Time     Final   Value: 02/12/2013 09:30     Performed at [facility]   Culture     Final   Value:        BLOOD CULTURE RECEIVED NO GROWTH TO DATE CULTURE WILL BE HELD FOR 5 DAYS BEFORE ISSUING A FINAL NEGATIVE REPORT     Performed at [facility]   Report Status PENDING   Incomplete  CULTURE, BLOOD (ROUTINE X 2)     Status: None   Collection Time    02/12/13 12:52 AM      Result Value Range Status   Specimen Description BLOOD LEFT ANTECUBITAL   Final   Special Requests BOTTLES DRAWN AEROBIC AND ANAEROBIC 10CC   Final   Culture  Setup Time     Final   Value: 02/12/2013 09:30     Performed at [facility]   Culture     Final   Value:        BLOOD CULTURE RECEIVED NO GROWTH TO DATE CULTURE WILL BE HELD FOR 5 DAYS BEFORE ISSUING A FINAL NEGATIVE REPORT     Performed at [facility]   Report Status PENDING   Incomplete  URINE CULTURE     Status: None   Collection Time    02/12/13  5:59 AM      Result Value Range Status   Specimen Description URINE, CATHETERIZED   Final   Special Requests NONE   Final   Culture  Setup Time     Final   Value: 02/12/2013 09:49     Performed at [facility] Count     Final   Value: NO GROWTH     Performed at [facility]   Culture     Final   Value: NO GROWTH     Performed at [facility]   Report Status 02/13/2013 FINAL   Final  MRSA PCR SCREENING     Status: None   Collection Time    02/12/13  7:59 PM      Result Value Range Status   MRSA by PCR NEGATIVE  NEGATIVE Final   Comment:            The  GeneXpert MRSA Assay (FDA     approved for NASAL specimens     only), is one component of a     comprehensive MRSA colonization     surveillance program. It is not     intended to diagnose MRSA     infection nor to guide or     monitor treatment for     MRSA infections.   Studies: Dg Chest  2 View  02/12/2013   CLINICAL DATA:  Congestive heart failure, chest pain and pressure  EXAM: CHEST  2 VIEW  COMPARISON:  02/12/2013  FINDINGS: Mild cardiac enlargement stable. Vascular pattern normal. Lungs clear. No pleural effusions.  IMPRESSION: No active cardiopulmonary disease.   Electronically Signed   By: Esperanza Heir M.D.   On: 02/12/2013 16:05   Scheduled Meds: . antiseptic oral rinse  15 mL Mouth Rinse BID  . DULoxetine  120 mg Oral q morning - 10a  . enoxaparin (LOVENOX) injection  40 mg Subcutaneous Q24H  . ezetimibe  10 mg Oral Daily  . pantoprazole  40 mg Oral Daily  . propranolol  20 mg Oral BID  . temazepam  15 mg Oral QHS   Continuous Infusions: . sodium chloride     Active Problems:   Chronic pain   Hypokalemia   Bacteremia due to Gram-positive bacteria   Diarrhea   Convulsions/seizures  Time spent: 35  Pamella Pert, MD [facility] Pager [phone]. If 7 PM - 7 AM, please contact night-coverage at [email or web address], password [facility] 02/14/2013, 7:20 AM  LOS: 3 days

## 2013-02-15 DIAGNOSIS — R569 Unspecified convulsions: Secondary | ICD-10-CM

## 2013-02-15 LAB — BASIC METABOLIC PANEL
CO2: 29 mEq/L (ref 19–32)
Calcium: 9.1 mg/dL (ref 8.4–10.5)
Glucose, Bld: 111 mg/dL — ABNORMAL HIGH (ref 70–99)
Sodium: 142 mEq/L (ref 135–145)

## 2013-02-15 MED ORDER — CARISOPRODOL 350 MG PO TABS: 350.0000 mg | ORAL_TABLET | Freq: Three times a day (TID) | ORAL | Status: DC | PRN

## 2013-02-15 MED ORDER — OXYCODONE-ACETAMINOPHEN 5-325 MG PO TABS: 2.0000 | ORAL_TABLET | Freq: Four times a day (QID) | ORAL | Status: DC | PRN

## 2013-02-15 MED ORDER — TEMAZEPAM 15 MG PO CAPS: 15.0000 mg | ORAL_CAPSULE | Freq: Every day | ORAL | Status: DC

## 2013-02-15 MED ORDER — POTASSIUM CHLORIDE CRYS ER 20 MEQ PO TBCR
40.0000 meq | EXTENDED_RELEASE_TABLET | Freq: Once | ORAL | Status: AC
  Administered 2013-02-15: 40 meq via ORAL
  Filled 2013-02-15: qty 2

## 2013-02-15 MED ORDER — POTASSIUM CHLORIDE ER 10 MEQ PO TBCR: 10.0000 meq | EXTENDED_RELEASE_TABLET | Freq: Two times a day (BID) | ORAL | Status: DC

## 2013-02-15 MED ORDER — DIAZEPAM 2 MG PO TABS: 2.0000 mg | ORAL_TABLET | Freq: Three times a day (TID) | ORAL | Status: DC | PRN

## 2013-02-15 MED ORDER — LEVETIRACETAM 500 MG PO TABS: 500.0000 mg | ORAL_TABLET | Freq: Two times a day (BID) | ORAL | Status: DC

## 2013-02-15 NOTE — Procedures (Signed)
EEG report.  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  Technique: this is a 17 channel routine scalp EEG performed at the bedside with bipolar and monopolar montages arranged in accordance to the international 10/20 system of electrode placement. One channel was dedicated to EKG recording.  The study was performed during wakefulness, drowsiness, and stage 2 sleep. No activating procedures performed.  Description:In the wakeful state, the best background consisted of a medium amplitude, posterior dominant, well sustained, symmetric and reactive 11 Hz rhythm. Beta activity was noted over the anterior head regions. Drowsiness demonstrated dropout of the alpha rhythm. Stage 2 sleep showed symmetric and synchronous sleep spindles without intermixed epileptiform discharges. No focal or generalized epileptiform discharges noted.  No slowing seen.  EKG showed sinus rhythm.  Impression: this is a normal awake and asleep EEG. The presence of beta activity is most likely a medication effect. Please, be aware that a normal EEG does not exclude the possibility of epilepsy.  Clinical correlation is advised.  Wyatt Portela, MD

## 2013-02-15 NOTE — Progress Notes (Signed)
PAtient discharged home with wife, discharge instructions given and explained to patient/wife and they verbalized understanding, denies any distress. Skin intact no wound. Accompanied home by wife. Transported to the car via wheelchair by staff

## 2013-02-15 NOTE — Discharge Summary (Signed)
Physician Discharge Summary  Alex Mcintosh ZOX:[id number] DOB: 1950/10/14 DOA: 02/11/2013  PCP: Carolynn Serve, NP  Admit date: 02/11/2013 Discharge date: 02/15/2013  Time spent: 35 minutes  Recommendations for Outpatient Follow-up:  1. Follow up with PCP in 1 week 2. Follow up with chronic pain in 5 days   Recommendations for primary care physician for things to follow:  BMP for K level  Discharge Diagnoses:  Active Problems:   Chronic pain   Hypokalemia   Bacteremia due to Gram-positive bacteria   Diarrhea   Convulsions/seizures  Discharge Condition: stable  Diet recommendation: regular  Filed Weights   02/11/13 2314 02/12/13 2000 02/14/13 0600  Weight: 113.399 kg (250 lb) 125.2 kg (276 lb 0.3 oz) 113.4 kg (250 lb)   History of present illness:  Alex Mcintosh is a 62 y.o. male has a past medical history of Hyperlipemia; Chronic back pain; GERD (gastroesophageal reflux disease); Anxiety; Chronic narcotic dependence; Hypothyroidism; Depression; Myoclonus; UTI (lower urinary tract infection) (5/14); Acute encephalopathy (09/06/12); Foot drop, bilateral; Shortness of breath; CHF (congestive heart failure); DVT (deep venous thrombosis); Seizures; Neuropathy; Arthritis; Memory loss of unknown cause; and AVN (avascular necrosis of bone). Presented with [name] has long standing history of chronic pain on chronic narcotics. He has also hx of multiple back surgeries and infections. Patient has run out of his Percocet for the past 1 and a half weeks. Wife states they have been battling insurance companies to get his prescriptions. 3-4 days ago he started to have symptoms of opioid withdrawal with sweating, diarrhea and cough and nausea with dry heaves. He presented to ER due to uncontrolled pain yesterday. As part of the work up blood cultures were obtained. He was discharged to home with prescription for Percocet. The pharmacist refused to fill that stating that patient should still have  enough from prior prescriptions. Today his cultures returned to be 1/2 positive for Gram post cocci. Patient was called in to come back to ER. Denies any fever or chills. His symptoms of withdrawal started to come back. He was started on Vancomycin but developed itching and redness over his back and this was discontinued. Of note patient is also out of his valium last dose was 1 week ago. He states he was given prescriptions for morphine but this was lost. Of note patient states that his symptoms have improved once he has received a dose of morphine. Patient has chronic problems with mobility but her neuropathy and foot drop.  Hospital Course:  Opioid withdrawal - patient with severe chronic pain followed by Dr. Maryagnes Amos in [place]. He has been having problems with obtaining his narcotics as an outpatient. After admission patient was started on Percocet with subsequent improvement of his symptoms.  Seizure - neurology has been consulted, and it is possible that his seizure was related to benzodiazepine withdrawal. He was started on Keppra 500 mg BID as well. Patient has been in SDU following his seizure and has remained stable and seizure free. EEG was performed which showed no focal or generalized epileptiform discharges. He is to follow up with his Neurologist in [facility]. Positive blood culture with only one bottle, growing coagulase negative Staphylococcus. This was believed to likely be a contaminant. Initially started on antibiotics however upon speciation antibiotics were discontinued and patient remained afebrile and without evidence of systemic infection.  Hypokalemia - requiring supplementation. He was discharged with low dose potassium supplementation and was advised to follow up with his PCP in 5-7 days  for BMP recheck.  Chronic pain - advised to follow up with [facility] in [facility].  Procedures:  EEG Impression: this is a normal awake and asleep EEG. The presence of beta activity is  most likely a medication effect. Please, be aware that a normal EEG does not exclude the possibility of epilepsy.   Consultations:  Neurology  Discharge Exam: Filed Vitals:   02/14/13 1500 02/14/13 2105 02/15/13 0605 02/15/13 1013  BP: 154/90 145/86 146/81 142/84  Pulse: 61 73 76 91  Temp: 98.3 F (36.8 C) 97.3 F (36.3 C) 98.3 F (36.8 C)   TempSrc: Oral Oral Oral   Resp: 19 18 18    Height:      Weight:      SpO2: 96% 99% 98%    General: NAD Cardiovascular: RRR Respiratory: CTA biL  Discharge Instructions   Medication List         carisoprodol 350 MG tablet  Commonly known as:  SOMA  Take 1 tablet (350 mg total) by mouth 3 (three) times daily as needed for muscle spasms.     diazepam 2 MG tablet  Commonly known as:  VALIUM  Take 1 tablet (2 mg total) by mouth every 8 (eight) hours as needed for anxiety.     DULoxetine 60 MG capsule  Commonly known as:  CYMBALTA  Take 120 mg by mouth every morning.     eszopiclone 3 MG Tabs  Generic drug:  Eszopiclone  Take 3 mg by mouth at bedtime. Take immediately before bedtime     ezetimibe 10 MG tablet  Commonly known as:  ZETIA  Take 10 mg by mouth daily.     multivitamin with minerals Tabs tablet  Take 1 tablet by mouth every morning.     omeprazole 20 MG capsule  Commonly known as:  PRILOSEC  Take 20 mg by mouth every morning.     oxyCODONE-acetaminophen 5-325 MG per tablet  Commonly known as:  PERCOCET  Take 2 tablets by mouth every 6 (six) hours as needed for pain.     potassium chloride 10 MEQ tablet  Commonly known as:  K-DUR  Take 1 tablet (10 mEq total) by mouth 2 (two) times daily.     propranolol 20 MG tablet  Commonly known as:  INDERAL  Take 20 mg by mouth 2 (two) times daily.     temazepam 15 MG capsule  Commonly known as:  RESTORIL  Take 1 capsule (15 mg total) by mouth at bedtime.       Follow-up Information   Follow up with [facility], Rubbie Battiest, NP. Schedule an appointment as soon as  possible for a visit in 1 week.   Specialty:  Internal Medicine   Contact information:   [phone] [address] [place] [address] [phone]       Follow up with RAINWATER,MARVIN K, PA-C. Schedule an appointment as soon as possible for a visit in 1 week.   Contact information:   [address] [place] [address] [phone]      The results of significant diagnostics from this hospitalization (including imaging, microbiology, ancillary and laboratory) are listed below for reference.    Significant Diagnostic Studies: Dg Chest 2 View  02/12/2013   CLINICAL DATA:  Congestive heart failure, chest pain and pressure  EXAM: CHEST  2 VIEW  COMPARISON:  02/12/2013  FINDINGS: Mild cardiac enlargement stable. Vascular pattern normal. Lungs clear. No pleural effusions.  IMPRESSION: No active cardiopulmonary disease.  Electronically Signed   By: Esperanza Heir M.D.   On: 02/12/2013 16:05   Dg Chest 2 View  02/10/2013   CLINICAL DATA:  Chest pain  EXAM: CHEST  2 VIEW  COMPARISON:  01/07/2013  FINDINGS: The heart size and mediastinal contours are within normal limits. Both lungs are clear. The visualized skeletal structures are unremarkable.  IMPRESSION: No active cardiopulmonary disease.   Electronically Signed   By: Christiana Pellant M.D.   On: 02/10/2013 17:32   Ct Head Wo Contrast  02/10/2013   CLINICAL DATA:  Narcotic withdrawals  EXAM: CT HEAD WITHOUT CONTRAST  TECHNIQUE: Contiguous axial images were obtained from the base of the skull through the vertex without intravenous contrast.  COMPARISON:  09/06/2012  FINDINGS: The bony calvarium is intact. The ventricles and cortical sulci are mildly prominent consistent with mild atrophy. No findings to suggest acute hemorrhage, acute infarction or space-occupying mass lesion are noted.  IMPRESSION: Mild atrophic changes without acute abnormality.   Electronically Signed   By: Alcide Clever M.D.   On: 02/10/2013 17:25   Dg Chest Port 1  View  02/12/2013   *RADIOLOGY REPORT*  Clinical Data: Cough.  PORTABLE CHEST - 1 VIEW  Comparison: Chest radiograph from 10/01 of 1014  Findings: The lungs are relatively well-aerated.  Vascular congestion is noted.  There is no evidence of focal opacification, pleural effusion or pneumothorax.  The cardiomediastinal silhouette is borderline normal in size.  No acute osseous abnormalities are seen.  IMPRESSION: Vascular congestion noted; lungs remain grossly clear.   Original Report Authenticated By: Tonia Ghent, M.D.    Microbiology: Recent Results (from the past 240 hour(s))  CULTURE, BLOOD (ROUTINE X 2)     Status: None   Collection Time    02/10/13  4:40 PM      Result Value Range Status   Specimen Description BLOOD LEFT WRIST   Final   Special Requests BOTTLES DRAWN AEROBIC AND ANAEROBIC 10CC   Final   Culture  Setup Time     Final   Value: 02/11/2013 02:34     Performed at [facility]   Culture     Final   Value:        BLOOD CULTURE RECEIVED NO GROWTH TO DATE CULTURE WILL BE HELD FOR 5 DAYS BEFORE ISSUING A FINAL NEGATIVE REPORT     Performed at [facility]   Report Status PENDING   Incomplete  CULTURE, BLOOD (ROUTINE X 2)     Status: None   Collection Time    02/10/13  4:45 PM      Result Value Range Status   Specimen Description BLOOD RIGHT ARM   Final   Special Requests BOTTLES DRAWN AEROBIC AND ANAEROBIC 10CC   Final   Culture  Setup Time     Final   Value: 02/11/2013 02:34     Performed at [facility]   Culture     Final   Value: STAPHYLOCOCCUS SPECIES (COAGULASE NEGATIVE)     Note: THE SIGNIFICANCE OF ISOLATING THIS ORGANISM FROM A SINGLE SET OF BLOOD CULTURES WHEN MULTIPLE SETS ARE DRAWN IS UNCERTAIN. PLEASE NOTIFY THE MICROBIOLOGY DEPARTMENT WITHIN ONE WEEK IF SPECIATION AND SENSITIVITIES ARE REQUIRED.     Note: Gram Stain Report Called to,Read Back By and Verified With: PATTY CLARK 02/11/2013 2152PM MITCV     Performed at [facility]   Report Status 02/14/2013 FINAL   Final  URINE CULTURE  Status: None   Collection Time    02/10/13  7:58 PM      Result Value Range Status   Specimen Description URINE, CATHETERIZED   Final   Special Requests NONE   Final   Culture  Setup Time     Final   Value: 02/11/2013 03:03     Performed at [facility]   Colony Count     Final   Value: NO GROWTH     Performed at [facility]   Culture     Final   Value: NO GROWTH     Performed at [facility]   Report Status 02/12/2013 FINAL   Final  CULTURE, BLOOD (ROUTINE X 2)     Status: None   Collection Time    02/11/13 11:50 PM      Result Value Range Status   Specimen Description BLOOD RIGHT HAND   Final   Special Requests BOTTLES DRAWN AEROBIC AND ANAEROBIC 5CC EACH   Final   Culture  Setup Time     Final   Value: 02/12/2013 09:30     Performed at [facility]   Culture     Final   Value:        BLOOD CULTURE RECEIVED NO GROWTH TO DATE CULTURE WILL BE HELD FOR 5 DAYS BEFORE ISSUING A FINAL NEGATIVE REPORT     Performed at [facility]   Report Status PENDING   Incomplete  CULTURE, BLOOD (ROUTINE X 2)     Status: None   Collection Time    02/12/13 12:52 AM      Result Value Range Status   Specimen Description BLOOD LEFT ANTECUBITAL   Final   Special Requests BOTTLES DRAWN AEROBIC AND ANAEROBIC 10CC   Final   Culture  Setup Time     Final   Value: 02/12/2013 09:30     Performed at [facility]   Culture     Final   Value:        BLOOD CULTURE RECEIVED NO GROWTH TO DATE CULTURE WILL BE HELD FOR 5 DAYS BEFORE ISSUING A FINAL NEGATIVE REPORT     Performed at [facility]   Report Status PENDING   Incomplete  URINE CULTURE     Status: None   Collection Time    02/12/13  5:59 AM      Result Value Range Status   Specimen Description URINE, CATHETERIZED   Final   Special Requests NONE   Final   Culture  Setup Time     Final   Value: 02/12/2013 09:49      Performed at [facility] Count     Final   Value: NO GROWTH     Performed at [facility]   Culture     Final   Value: NO GROWTH     Performed at [facility]   Report Status 02/13/2013 FINAL   Final  MRSA PCR SCREENING     Status: None   Collection Time    02/12/13  7:59 PM      Result Value Range Status   MRSA by PCR NEGATIVE  NEGATIVE Final   Comment:            The GeneXpert MRSA Assay (FDA     approved for NASAL specimens     only), is one component of a     comprehensive MRSA colonization  surveillance program. It is not     intended to diagnose MRSA     infection nor to guide or     monitor treatment for     MRSA infections.    Labs: Basic Metabolic Panel:  Recent Labs Lab 02/11/13 2350 02/12/13 0535 02/12/13 2200 02/13/13 1304 02/15/13 0405  NA 139 142 141 140 142  K 3.0* 3.2* 3.2* 3.1* 3.3*  CL 106 109 105 103 104  CO2 23 24 27 26 29   GLUCOSE 100* 98 109* 126* 111*  BUN 11 9 9 6 8   CREATININE 0.69 0.75 0.80 0.66 0.82  CALCIUM 8.9 8.7 8.7 8.9 9.1  MG  --  1.9  --   --   --   PHOS  --  3.4  --   --   --    Liver Function Tests:  Recent Labs Lab 02/10/13 1645 02/11/13 2350 02/12/13 0535 02/12/13 2200 02/13/13 1304  AST 32 25 25 29 21   ALT 17 17 15 21 16   ALKPHOS 117 101 96 105 106  BILITOT 1.1 0.7 0.7 0.5 0.5  PROT 7.7 6.6 6.4 6.9 6.8  ALBUMIN 4.0 3.5 3.4* 3.6 3.5    Recent Labs Lab 02/10/13 1642  AMMONIA 28   CBC:  Recent Labs Lab 02/10/13 1645 02/11/13 2350 02/12/13 0535 02/12/13 2200 02/13/13 1304  WBC 12.6* 10.5 9.5 9.6 10.2  NEUTROABS 8.5* 6.0  --  7.1 7.0  HGB 15.2 13.7 13.4 14.4 14.1  HCT 43.8 40.3 39.8 42.1 39.9  MCV 83.3 83.6 84.9 84.0 83.3  PLT 255 238 209 219 211   Cardiac Enzymes:  Recent Labs Lab 02/10/13 1645 02/12/13 1558 02/12/13 2200 02/13/13 0328  CKTOTAL 375*  --   --   --   TROPONINI  --  <0.30 <0.30 <0.30   BNP: BNP (last 3 results)  Recent Labs   06/13/12 1550  PROBNP 120.4   CBG:  Recent Labs Lab 02/12/13 1644  GLUCAP 108*   Signed: GHERGHE, COSTIN  [facility] 02/15/2013, 12:57 PM

## 2013-02-18 LAB — CULTURE, BLOOD (ROUTINE X 2)

## 2013-02-28 ENCOUNTER — Other Ambulatory Visit: Payer: Self-pay | Admitting: Internal Medicine

## 2013-03-05 ENCOUNTER — Ambulatory Visit: Payer: Self-pay | Admitting: Neurology

## 2013-03-05 ENCOUNTER — Encounter: Payer: Self-pay | Admitting: Neurology

## 2013-03-05 ENCOUNTER — Ambulatory Visit: Payer: Medicare Other | Admitting: Neurology

## 2013-03-05 ENCOUNTER — Telehealth: Payer: Self-pay | Admitting: Neurology

## 2013-03-05 NOTE — Telephone Encounter (Signed)
This patient did not show for the appointment today. 

## 2013-03-05 NOTE — Telephone Encounter (Signed)
The patient did not show for the appointment today. 

## 2013-03-06 ENCOUNTER — Encounter: Payer: Self-pay | Admitting: Neurology

## 2013-03-06 ENCOUNTER — Ambulatory Visit: Payer: Medicare Other

## 2013-03-06 ENCOUNTER — Ambulatory Visit (INDEPENDENT_AMBULATORY_CARE_PROVIDER_SITE_OTHER): Payer: Medicare Other | Admitting: Neurology

## 2013-03-06 VITALS — BP 147/106 | HR 109 | Ht 68.0 in | Wt 264.0 lb

## 2013-03-06 DIAGNOSIS — R569 Unspecified convulsions: Secondary | ICD-10-CM

## 2013-03-06 NOTE — Progress Notes (Signed)
Reason for visit: Seizures  Alex Mcintosh is a 62 y.o. male  History of present illness:  Alex Mcintosh is a 62 year old right-handed white male with a history of chronic neck and low back pain on chronic daily opiate medications and diazepam. The patient ran out of both of these medications recently, and this necessitated and a hospital admission around 02/11/2013. The patient gives a history of intermittent episodes of left-sided jerking that began around 2009. The episodes will involve the left arm and leg, with head turning to the right during the events. The patient does not lose consciousness. The patient indicates that as long as he is on diazepam, these events are not a problem. The patient does not operate a motor vehicle. The patient was admitted to the hospital on 02/11/2013, mainly secondary to bacteremia. The patient suffered a generalized seizure during that admission. The patient underwent an EEG study that was unremarkable. A CT scan of the brain did not show any acute changes. The patient had MRI evaluation in April of 2014 that revealed evidence of a small acute cortical stroke on the left parietal area, with evidence of a chronic stroke on the right parietal area. The patient had a MRI as part workup for altered mental status in April 2014. The patient apparently was found in his car in [place] when he was trying to visit his daughter. The patient does not recall any events for 3 days. The patient was found to have a severe urinary tract infection. The patient has been placed on Keppra during this recent admission in October of 2014. The patient has not had any further episodes of left-sided jerking or generalized seizures. The patient reports no focal numbness or weakness of the face, arms, or legs. The patient does have a history of a "neuropathy" with bilateral foot drops. The patient has been using a walker for ambulation for approximately one year.  Past Medical History  Diagnosis Date   . Hyperlipemia   . Chronic back pain   . GERD (gastroesophageal reflux disease)   . Anxiety   . Chronic narcotic dependence   . Hypothyroidism   . Depression   . Myoclonus   . UTI (lower urinary tract infection) 5/14    was confused and was found in [place].  Marland Kitchen Acute encephalopathy 09/06/12    was admitted to [facility]  . Foot drop, bilateral   . Shortness of breath     with exertion  . CHF (congestive heart failure)     diurects stopped in April  . DVT (deep venous thrombosis)     01/2012  . Seizures     Myoclonus  . Neuropathy     legs  . Arthritis   . Memory loss of unknown cause   . AVN (avascular necrosis of bone)     right  . Obese   . History of cerebrovascular disease     Past Surgical History  Procedure Laterality Date  . Back surgery      4 lumbar  . Hip surgery Left   . Cervical fusion    . Knee arthroscopy Right   . Joint replacement    . Pain pump implantation  Reports no longer has pump  . Tooth extraction N/A 11/16/2012    Procedure: DENTAL EXTRACTIONS OF MULTIPLE NONRESTORABLE TEETH;  Surgeon: Hinton Dyer, DDS;  Location: [facility] OR;  Service: Oral Surgery;  Laterality: N/A;  #12, 14, 15, 22, 23, 24, 25, 26, 27, 28  Family History  Problem Relation Age of Onset  . CAD Father   . Heart disease Father   . Heart failure Mother   . Breast cancer Sister   . CAD Brother     Social history:  reports that he has never smoked. He has never used smokeless tobacco. He reports that he drinks alcohol. He reports that he does not use illicit drugs.  Medications:  Current Outpatient Prescriptions on File Prior to Visit  Medication Sig Dispense Refill  . carisoprodol (SOMA) 350 MG tablet Take 1 tablet (350 mg total) by mouth 3 (three) times daily as needed for muscle spasms.  30 tablet  0  . diazepam (VALIUM) 2 MG tablet Take 1 tablet (2 mg total) by mouth every 8 (eight) hours as needed for anxiety.  30 tablet  0  . DULoxetine (CYMBALTA) 60 MG capsule Take 120 mg by  mouth every morning.       . Eszopiclone (ESZOPICLONE) 3 MG TABS Take 3 mg by mouth at bedtime. Take immediately before bedtime      . ezetimibe (ZETIA) 10 MG tablet Take 10 mg by mouth daily.      Marland Kitchen levETIRAcetam (KEPPRA) 500 MG tablet Take 1 tablet (500 mg total) by mouth every 12 (twelve) hours.  60 tablet  1  . Multiple Vitamin (MULTIVITAMIN WITH MINERALS) TABS tablet Take 1 tablet by mouth every morning.       Marland Kitchen omeprazole (PRILOSEC) 20 MG capsule Take 20 mg by mouth every morning.       . potassium chloride (K-DUR) 10 MEQ tablet Take 1 tablet (10 mEq total) by mouth 2 (two) times daily.  30 tablet  0  . propranolol (INDERAL) 20 MG tablet Take 20 mg by mouth 2 (two) times daily.      . temazepam (RESTORIL) 15 MG capsule Take 1 capsule (15 mg total) by mouth at bedtime.  10 capsule  0   No current facility-administered medications on file prior to visit.      Allergies  Allergen Reactions  . Ceftin [Cefuroxime Axetil] Hives  . Statins Other (See Comments)    Messes with liver enzymes    ROS:  Out of a complete 14 system review of symptoms, the patient complains only of the following symptoms, and all other reviewed systems are negative.  Fatigue Chest pain, palpitations of the heart Ringing in the ears Moles Easy bruising Feeling cold Joint pain, joint swelling, muscle cramps, achy muscles Memory loss, confusion, headache, numbness, weakness, seizure, tremor Depression, anxiety, insomnia, hallucinations, racing thoughts  Blood pressure 147/106, pulse 109, height 5\' 8"  (1.727 m), weight 264 lb (119.75 kg).  Physical Exam  General: The patient is alert and cooperative at the time of the examination. The patient is moderately to markedly obese.  Head: Pupils are equal, round, and reactive to light. Discs are flat bilaterally.  Neck: The neck is supple, no carotid bruits are noted.  Respiratory: The respiratory examination is clear.  Cardiovascular: The cardiovascular  examination reveals a regular rate and rhythm, no obvious murmurs or rubs are noted.  Skin: Extremities are with 1+ edema at the ankles bilaterally.  Neurologic Exam  Mental status: The patient is alert and oriented x3.  Cranial nerves: Facial symmetry is present. There is good sensation of the face to pinprick and soft touch bilaterally. The strength of the facial muscles and the muscles to head turning and shoulder shrug are normal bilaterally. Speech is well enunciated, no aphasia or dysarthria  is noted. Extraocular movements are full. Visual fields are full.  Motor: The motor testing reveals 5 over 5 strength of all 4 extremities. Good symmetric motor tone is noted throughout. No evidence of foot drops are seen.  Sensory: Sensory testing is intact to pinprick, soft touch, vibration sensation, and position sense the upper extremities , with the exception that there is a decrease in pin sensation of the left forearm and hand as compared to the right. On the lower extremities, the patient does not have a clear stocking pattern pinprick sensory deficit, but he reports generalized decrease in pinprick sensation in the legs. Vibration sensation is symmetric in the feet, and the patient has a decrease in vibration sensation bilaterally. No evidence of extinction is noted.  Coordination: Cerebellar testing reveals good finger-nose-finger and heel-to-shin bilaterally.  Gait and station: Gait is wide-based, unsteady. Tandem gait is poor, the patient is unable to perform. Romberg is unsteady. No drift is seen.  Reflexes: Deep tendon reflexes are symmetric, but are depressed bilaterally. Toes are downgoing bilaterally.   Assessment/Plan:  1. Chronic neck and low back pain  2. History of left-sided jerking, likely focal seizures  3. Generalized seizure, probable opiate and diazepam withdrawal seizure  4. Gait disorder  The patient is followed through a pain center for his chronic pain issues.  The patient likely suffered a withdrawal seizure, but he also gives a history of left sided partial motor seizures. The patient is on Keppra for this. The patient will followup through this office in 6 months. The patient is not operating a motor vehicle.  Marlan Palau MD 03/06/2013 7:42 PM  [facility] [address] [place], [place] [address]  Phone [phone] Fax [phone]

## 2013-03-09 ENCOUNTER — Telehealth: Payer: Self-pay | Admitting: *Deleted

## 2013-03-09 DIAGNOSIS — G8929 Other chronic pain: Secondary | ICD-10-CM

## 2013-03-09 NOTE — Telephone Encounter (Signed)
I called patient. The patient is interested in considering a morphine pump placement. I'll get him referred to Dr. Thyra Breed. The patient is currently followed through a pain Center and [place], [place], but he wishes to be followed up here in [place]. He has discussed this issue with his pain Center in [facility].

## 2013-03-09 NOTE — Telephone Encounter (Signed)
Patient is wanting to discuss when to get started on the pump, doesn't want to go back to [facility], resides in [place], wants it by the end of the year for insurance purposes.

## 2013-03-15 ENCOUNTER — Other Ambulatory Visit: Payer: Self-pay

## 2013-03-23 ENCOUNTER — Telehealth: Payer: Self-pay | Admitting: Neurology

## 2013-03-23 DIAGNOSIS — G894 Chronic pain syndrome: Secondary | ICD-10-CM

## 2013-03-23 NOTE — Telephone Encounter (Signed)
I will try to make a referral to Dr. Thyra Breed. If he manages morphine pump, we will send the patient to this pain center.

## 2013-03-26 ENCOUNTER — Telehealth: Payer: Self-pay | Admitting: Neurology

## 2013-03-28 ENCOUNTER — Other Ambulatory Visit: Payer: Self-pay | Admitting: Neurology

## 2013-03-28 DIAGNOSIS — G894 Chronic pain syndrome: Secondary | ICD-10-CM

## 2013-03-30 ENCOUNTER — Telehealth: Payer: Self-pay | Admitting: Neurology

## 2013-03-30 NOTE — Telephone Encounter (Signed)
I called patient. I have referred him to 2 different pain centers, neither pain Center can manage a morphine pump. I'll contact the [employer] representative to see if they know of anyone in the area that can do this.

## 2013-03-30 NOTE — Telephone Encounter (Signed)
Please advise 

## 2013-04-16 ENCOUNTER — Telehealth: Payer: Self-pay | Admitting: Neurology

## 2013-04-16 MED ORDER — LEVETIRACETAM 750 MG PO TABS: 750.0000 mg | ORAL_TABLET | Freq: Two times a day (BID) | ORAL | Status: DC

## 2013-04-16 NOTE — Telephone Encounter (Signed)
Please advise 

## 2013-04-16 NOTE — Telephone Encounter (Signed)
I called patient. The patient is managed with pain Center, but they do not do morphine pump management. The patient has had a morphine pump in the past, but he was removed. The patient is amenable to having another, place. He'll ask his pain management Center if they know of anyone who can manage this pump. The patient continues to have jerking episodes, I will increase the Keppra taking 750 mg twice daily.

## 2013-07-13 ENCOUNTER — Telehealth: Payer: Self-pay | Admitting: Neurology

## 2013-07-13 MED ORDER — LEVETIRACETAM 1000 MG PO TABS: 1000.0000 mg | ORAL_TABLET | Freq: Two times a day (BID) | ORAL | Status: DC

## 2013-07-13 NOTE — Telephone Encounter (Signed)
Pt's wife called.  She stated that for the past 3 weeks Alex Mcintosh's seizures have started to come back and they are getting worse and more frequent.  Wife stated that he is taking his meds as prescribed.  They didn't know if they should increase his dosage, if there was something new that Dr. Anne HahnWillis may want to try or if they needed to come in for an appointment. Please call the wife at either her work # [phone] or her cell # [phone].  Thank you.

## 2013-07-13 NOTE — Telephone Encounter (Signed)
Over the last 3 weeks, the patient has begun having more episodes of left-sided jerking. The Keppra have worked well for a long time. No other changes in medications have occurred. The patient has had about 10 such episodes within the last 3 weeks. I will increase the Keppra taking 1000 mg twice daily.

## 2013-07-13 NOTE — Telephone Encounter (Signed)
Patient is having more seizures, taking medications as prescribed, please advise if medications need to be increased or changed.  Message sent to Dr. Anne HahnWillis.

## 2013-08-01 ENCOUNTER — Telehealth: Payer: Self-pay | Admitting: Neurology

## 2013-08-01 NOTE — Telephone Encounter (Signed)
I called concerned the patient, the patient is having frequent episodes of jerking, the Keppra dosage was recently increased. The patient will be set up for an earlier revisit, the patient already has an appointment on 09/04/2013.

## 2013-08-01 NOTE — Telephone Encounter (Signed)
Fransico MichaelRuth Hathaway pt's OT from [facility] called states pt had a mild seizure last Friday and pt had 2 mild ones today and had another one while I was on the phone Windell MouldingRuth. Windell MouldingRuth would like for Dr. Anne HahnWillis or nurse to call her back concerning this matter. Thanks

## 2013-08-02 ENCOUNTER — Telehealth: Payer: Self-pay | Admitting: *Deleted

## 2013-08-02 NOTE — Telephone Encounter (Signed)
Called to schedule office visit for Alex Mcintosh for next Tues 3/31 @ 10. Spot is being held under a pt who has cancelled.

## 2013-08-03 ENCOUNTER — Ambulatory Visit (INDEPENDENT_AMBULATORY_CARE_PROVIDER_SITE_OTHER): Payer: Medicare Other | Admitting: Neurology

## 2013-08-03 ENCOUNTER — Encounter: Payer: Self-pay | Admitting: Neurology

## 2013-08-03 VITALS — BP 121/84 | HR 72 | Temp 97.8°F | Ht 68.0 in | Wt 260.5 lb

## 2013-08-03 DIAGNOSIS — R569 Unspecified convulsions: Secondary | ICD-10-CM

## 2013-08-03 DIAGNOSIS — R269 Unspecified abnormalities of gait and mobility: Secondary | ICD-10-CM | POA: Insufficient documentation

## 2013-08-03 HISTORY — DX: Unspecified abnormalities of gait and mobility: R26.9

## 2013-08-03 MED ORDER — LEVETIRACETAM 750 MG PO TABS: 1500.0000 mg | ORAL_TABLET | Freq: Two times a day (BID) | ORAL | Status: DC

## 2013-08-03 NOTE — Progress Notes (Signed)
Reason for visit: Seizures  Cy BlamerJohn T Dam is an 63 y.o. male  History of present illness:  Mr. Anne Hahnuft is a 63 year old right-handed white male with a history of seizure-type events. The patient has had episodes of left-sided jerking, and he has been on Keppra. The patient has done quite well from October 2014 until 3 weeks ago. The patient then began having a significant increase in the number of jerking episodes that he has had. The Keppra dose has been increased to 1000 mg twice daily, which seemed to help transiently, but the seizures have once again returned. The patient is having ongoing problems with chronic pain. The patient has had an increase in his Nucynta dose within the last month or 2. The patient has been followed through a pain center. He is being evaluated for a possible morphine pump. The patient returns to this office for an evaluation.  Past Medical History  Diagnosis Date  . Hyperlipemia   . Chronic back pain   . GERD (gastroesophageal reflux disease)   . Anxiety   . Chronic narcotic dependence   . Hypothyroidism   . Depression   . Myoclonus   . UTI (lower urinary tract infection) 5/14    [place]  Mar[place]. Acute encephalopathy 09/06/12    [facility]  . Foot drop, bilateral   . Shortness of breath     with exertion  . CHF (congestive heart failure)     diurects stopped in April  . DVT (deep venous thrombosis)     01/2012  . Seizures     Myoclonus  . Neuropathy     legs  . Arthritis   . Memory loss of unknown cause   . AVN (avascular necrosis of bone)     right  . Obese   . History of cerebrovascular disease   . Abnormality of gait 08/03/2013    Past Surgical History  Procedure Laterality Date  . Back surgery      4 lumbar  . Hip surgery Left   . Cervical fusion    . Knee arthroscopy Right   . Joint replacement    . Pain pump implantation  Reports no longer has pump  . Tooth extraction N/A 11/16/2012    Procedure: DENTAL  EXTRACTIONS OF MULTIPLE NONRESTORABLE TEETH;  Surgeon: Hinton DyerJoseph L Miller, DDS;  Location: [facility] OR;  Service: Oral Surgery;  Laterality: N/A;  #12, 14, 15, 22, 23, 24, 25, 26, 27, 28    Family History  Problem Relation Age of Onset  . CAD Father   . Heart disease Father   . Heart failure Mother   . Breast cancer Sister   . CAD Brother     Social history:  reports that he has never smoked. He has never used smokeless tobacco. He reports that he drinks alcohol. He reports that he does not use illicit drugs.    Allergies  Allergen Reactions  . Ceftin [Cefuroxime Axetil] Hives  . Statins Other (See Comments)    Messes with liver enzymes    Medications:  Current Outpatient Prescriptions on File Prior to Visit  Medication Sig Dispense Refill  . aspirin 81 MG tablet Take 81 mg by mouth daily.      . carisoprodol (SOMA) 350 MG tablet Take 1 tablet (350 mg total) by mouth 3 (three) times daily as needed for muscle spasms.  30 tablet  0  . diazepam (VALIUM) 2 MG tablet Take  1 tablet (2 mg total) by mouth every 8 (eight) hours as needed for anxiety.  30 tablet  0  . DULoxetine (CYMBALTA) 60 MG capsule Take 120 mg by mouth every morning.       . Eszopiclone (ESZOPICLONE) 3 MG TABS Take 3 mg by mouth at bedtime. Take immediately before bedtime      . ezetimibe (ZETIA) 10 MG tablet Take 10 mg by mouth daily.      Marland Kitchen omeprazole (PRILOSEC) 20 MG capsule Take 20 mg by mouth every morning.       . potassium chloride (K-DUR) 10 MEQ tablet Take 1 tablet (10 mEq total) by mouth 2 (two) times daily.  30 tablet  0  . propranolol (INDERAL) 20 MG tablet Take 20 mg by mouth 2 (two) times daily.      . temazepam (RESTORIL) 15 MG capsule Take 1 capsule (15 mg total) by mouth at bedtime.  10 capsule  0  . Multiple Vitamin (MULTIVITAMIN WITH MINERALS) TABS tablet Take 1 tablet by mouth every morning.        No current facility-administered medications on file prior to visit.    ROS:  Out of a complete 14  system review of symptoms, the patient complains only of the following symptoms, and all other reviewed systems are negative.  Chills, fatigue Neck pain, neck stiffness Light sensitivity Cold intolerance Frequent waking, daytime sleepiness Joint pain, back pain, aching muscles, muscle cramps, walking difficulties, coordination problems Memory loss, headache, numbness, seizures, weakness Confusion, depression, hallucinations  Blood pressure 121/84, pulse 72, temperature 97.8 F (36.6 C), temperature source Oral, height 5\' 8"  (1.727 m), weight 260 lb 8 oz (118.162 kg).  Physical Exam  General: The patient is alert and cooperative at the time of the examination. The patient is moderately to markedly obese.  Skin: 2+ edema below the knees is noted.   Neurologic Exam  Mental status: The patient is oriented x 3. The patient is somewhat delayed with his cognitive responses.  Cranial nerves: Facial symmetry is present. Speech is normal, no aphasia or dysarthria is noted. Extraocular movements are full. Visual fields are full.  Motor: The patient has good strength in all 4 extremities.  Sensory examination: Soft touch sensation is decreased on the right leg and right arm. Symmetric sensation is noted on the face.  Coordination: The patient has good finger-nose-finger and heel-to-shin bilaterally.  Gait and station: The patient has a somewhat wide-based gait, unsteady. The patient uses a walker for ambulation. Tandem gait was not attempted. Romberg is positive, the patient does backwards. No drift is seen.  Reflexes: Deep tendon reflexes are symmetric.   Assessment/Plan:  1. History of seizures, left focal  2. Chronic pain syndrome  3. Gait disorder  The patient is not doing well with his underlying pain syndrome. The patient has had a recent increase in Nucynta dose, which may exacerbate his seizures. The patient may need to cut back on this medication. The patient will be  increased on the Keppra taking 1500 mg twice daily. The patient will be sent for a repeat MRI of the brain. The patient will followup in 3 or 4 months.  Marlan Palau MD 08/04/2013 3:38 PM  [facility] [address] [place], [place] [address]  Phone [phone] Fax [phone]

## 2013-08-06 ENCOUNTER — Telehealth: Payer: Self-pay | Admitting: Neurology

## 2013-08-06 NOTE — Telephone Encounter (Signed)
Called Alex Mcintosh w/ Hospice and she is asking if Dr. Anne HahnWillis would like to be the attending physician, while pt is a hospice pt. Please advise

## 2013-08-06 NOTE — Telephone Encounter (Signed)
I will be happy to function as the attending for this patient.

## 2013-08-06 NOTE — Telephone Encounter (Signed)
Alex Mcintosh w/Hospice called needs Dr. Anne HahnWillis nurse to call her back concerning pt [facility] states pt needs Hospice home care. Please call Alex Mcintosh back concerning this matter. Thanks

## 2013-08-07 NOTE — Telephone Encounter (Signed)
Shared telephone message from Dr Anne HahnWillis with Alex CavalierEvette from [facility],verbalized understanding, also wanted to know if Dr Anne HahnWillis would like for one of their attending physicians to be a symptom doctor so that if a problem arises during the middle of the night, they would not have to call him

## 2013-08-08 ENCOUNTER — Telehealth: Payer: Self-pay | Admitting: Neurology

## 2013-08-08 NOTE — Telephone Encounter (Signed)
Heather nurse with [facility] called just wanted to relay this information from her visit with pt to Dr. Anne HahnWillis. Herbert SetaHeather states pt had a seizure during their discussion of the visit only lasted 2 minutes pt came out ok no problems with communications. Herbert SetaHeather states pt's wife said this was normal for him to have these seizure's like this and that they didn't last long. Thanks

## 2013-08-08 NOTE — Telephone Encounter (Signed)
Events noted. The patient apparently went to hospice, likely self-referred. I spoke with a physician, and it appears that the patient is there for reasons that are not appropriate. The patient may be discharged soon.

## 2013-08-09 NOTE — Telephone Encounter (Signed)
Dr. Anne HahnWillis has spoken to the other physician concerning this matter.

## 2013-08-10 ENCOUNTER — Inpatient Hospital Stay: Admission: RE | Admit: 2013-08-10 | Payer: Self-pay | Source: Ambulatory Visit

## 2013-08-20 ENCOUNTER — Ambulatory Visit
Admission: RE | Admit: 2013-08-20 | Discharge: 2013-08-20 | Disposition: A | Discharge status: Home / Self Care | Payer: Medicare Other | Source: Ambulatory Visit | Attending: Neurology | Admitting: Neurology

## 2013-08-20 DIAGNOSIS — R269 Unspecified abnormalities of gait and mobility: Secondary | ICD-10-CM

## 2013-08-20 DIAGNOSIS — R569 Unspecified convulsions: Secondary | ICD-10-CM

## 2013-08-21 ENCOUNTER — Telehealth: Payer: Self-pay | Admitting: Neurology

## 2013-08-21 DIAGNOSIS — G40219 Localization-related (focal) (partial) symptomatic epilepsy and epileptic syndromes with complex partial seizures, intractable, without status epilepticus: Secondary | ICD-10-CM

## 2013-08-21 NOTE — Telephone Encounter (Signed)
I called patient. The MRI study of the brain has not been formally read, but by my reading, does not show any significant cortical issues that result in seizures. The patient indicates that he continues to have seizure events, and he claims that he went off of the Nucynta, and he has gone up on the Keppra dose. I'll repeat another EEG study, we may consider adding another seizure medication, and setting up a video EEG monitoring study.

## 2013-08-21 NOTE — Telephone Encounter (Signed)
Patient said that he was having the MRI and started having a seizure, wanted to know if they got enough information from the precedure.  He did not go to ER, just laid there for a while.

## 2013-08-21 NOTE — Telephone Encounter (Signed)
Pt called is wanting to know if Dr. Anne HahnWillis has heard anything on pt's test results. Please call pt concerning this matter. Thanks

## 2013-08-29 ENCOUNTER — Emergency Department (HOSPITAL_COMMUNITY)
Admission: EM | Admit: 2013-08-29 | Discharge: 2013-08-30 | Disposition: A | Discharge status: Home / Self Care | Payer: Medicare Other | Attending: Emergency Medicine | Admitting: Emergency Medicine

## 2013-08-29 ENCOUNTER — Encounter (HOSPITAL_COMMUNITY): Payer: Self-pay | Admitting: Emergency Medicine

## 2013-08-29 ENCOUNTER — Emergency Department (HOSPITAL_COMMUNITY): Payer: Medicare Other

## 2013-08-29 DIAGNOSIS — Z9889 Other specified postprocedural states: Secondary | ICD-10-CM | POA: Insufficient documentation

## 2013-08-29 DIAGNOSIS — K219 Gastro-esophageal reflux disease without esophagitis: Secondary | ICD-10-CM | POA: Insufficient documentation

## 2013-08-29 DIAGNOSIS — Y92009 Unspecified place in unspecified non-institutional (private) residence as the place of occurrence of the external cause: Secondary | ICD-10-CM | POA: Insufficient documentation

## 2013-08-29 DIAGNOSIS — Z8739 Personal history of other diseases of the musculoskeletal system and connective tissue: Secondary | ICD-10-CM | POA: Insufficient documentation

## 2013-08-29 DIAGNOSIS — Z8673 Personal history of transient ischemic attack (TIA), and cerebral infarction without residual deficits: Secondary | ICD-10-CM | POA: Insufficient documentation

## 2013-08-29 DIAGNOSIS — E669 Obesity, unspecified: Secondary | ICD-10-CM | POA: Insufficient documentation

## 2013-08-29 DIAGNOSIS — W19XXXA Unspecified fall, initial encounter: Secondary | ICD-10-CM

## 2013-08-29 DIAGNOSIS — W1809XA Striking against other object with subsequent fall, initial encounter: Secondary | ICD-10-CM | POA: Insufficient documentation

## 2013-08-29 DIAGNOSIS — IMO0002 Reserved for concepts with insufficient information to code with codable children: Secondary | ICD-10-CM | POA: Insufficient documentation

## 2013-08-29 DIAGNOSIS — G40909 Epilepsy, unspecified, not intractable, without status epilepticus: Secondary | ICD-10-CM | POA: Insufficient documentation

## 2013-08-29 DIAGNOSIS — Z86718 Personal history of other venous thrombosis and embolism: Secondary | ICD-10-CM | POA: Insufficient documentation

## 2013-08-29 DIAGNOSIS — Z87440 Personal history of urinary (tract) infections: Secondary | ICD-10-CM | POA: Insufficient documentation

## 2013-08-29 DIAGNOSIS — F329 Major depressive disorder, single episode, unspecified: Secondary | ICD-10-CM | POA: Insufficient documentation

## 2013-08-29 DIAGNOSIS — Y939 Activity, unspecified: Secondary | ICD-10-CM | POA: Insufficient documentation

## 2013-08-29 DIAGNOSIS — G8929 Other chronic pain: Secondary | ICD-10-CM | POA: Insufficient documentation

## 2013-08-29 DIAGNOSIS — E785 Hyperlipidemia, unspecified: Secondary | ICD-10-CM | POA: Insufficient documentation

## 2013-08-29 DIAGNOSIS — F411 Generalized anxiety disorder: Secondary | ICD-10-CM | POA: Insufficient documentation

## 2013-08-29 DIAGNOSIS — R748 Abnormal levels of other serum enzymes: Secondary | ICD-10-CM | POA: Insufficient documentation

## 2013-08-29 DIAGNOSIS — T07XXXA Unspecified multiple injuries, initial encounter: Secondary | ICD-10-CM | POA: Insufficient documentation

## 2013-08-29 DIAGNOSIS — Z79899 Other long term (current) drug therapy: Secondary | ICD-10-CM | POA: Insufficient documentation

## 2013-08-29 DIAGNOSIS — I509 Heart failure, unspecified: Secondary | ICD-10-CM | POA: Insufficient documentation

## 2013-08-29 DIAGNOSIS — F3289 Other specified depressive episodes: Secondary | ICD-10-CM | POA: Insufficient documentation

## 2013-08-29 LAB — CBC WITH DIFFERENTIAL/PLATELET
Basophils Absolute: 0 10*3/uL (ref 0.0–0.1)
Basophils Relative: 0 % (ref 0–1)
Eosinophils Absolute: 0 10*3/uL (ref 0.0–0.7)
Eosinophils Relative: 0 % (ref 0–5)
HCT: 42.8 % (ref 39.0–52.0)
Hemoglobin: 14 g/dL (ref 13.0–17.0)
Lymphocytes Relative: 27 % (ref 12–46)
Lymphs Abs: 2.6 10*3/uL (ref 0.7–4.0)
MCH: 27.9 pg (ref 26.0–34.0)
MCHC: 32.7 g/dL (ref 30.0–36.0)
MCV: 85.3 fL (ref 78.0–100.0)
Monocytes Absolute: 0.9 10*3/uL (ref 0.1–1.0)
Monocytes Relative: 9 % (ref 3–12)
Neutro Abs: 6.3 10*3/uL (ref 1.7–7.7)
Neutrophils Relative %: 64 % (ref 43–77)
Platelets: 156 10*3/uL (ref 150–400)
RBC: 5.02 MIL/uL (ref 4.22–5.81)
RDW: 13.2 % (ref 11.5–15.5)
WBC: 9.8 10*3/uL (ref 4.0–10.5)

## 2013-08-29 LAB — BASIC METABOLIC PANEL
BUN: 11 mg/dL (ref 6–23)
CO2: 30 mEq/L (ref 19–32)
Calcium: 9.1 mg/dL (ref 8.4–10.5)
Chloride: 104 mEq/L (ref 96–112)
Creatinine, Ser: 0.79 mg/dL (ref 0.50–1.35)
GFR calc Af Amer: >90 mL/min (ref >90)
GFR calc non Af Amer: >90 mL/min (ref >90)
Glucose, Bld: 124 mg/dL — ABNORMAL HIGH (ref 70–99)
Potassium: 5.3 mEq/L (ref 3.7–5.3)
Sodium: 143 mEq/L (ref 137–147)

## 2013-08-29 LAB — CK
Total CK: 2702 U/L — ABNORMAL HIGH (ref 7–232)
Total CK: 2658 U/L — ABNORMAL HIGH (ref 7–232)

## 2013-08-29 MED ORDER — KETOROLAC TROMETHAMINE 30 MG/ML IJ SOLN
30.0000 mg | Freq: Once | INTRAMUSCULAR | Status: AC
  Administered 2013-08-29: 30 mg via INTRAVENOUS
  Filled 2013-08-29: qty 1

## 2013-08-29 MED ORDER — HYDROMORPHONE HCL PF 1 MG/ML IJ SOLN
1.0000 mg | Freq: Once | INTRAMUSCULAR | Status: AC
  Administered 2013-08-29: 1 mg via INTRAVENOUS
  Filled 2013-08-29: qty 1

## 2013-08-29 MED ORDER — SODIUM CHLORIDE 0.9 % IV BOLUS (SEPSIS)
1000.0000 mL | Freq: Once | INTRAVENOUS | Status: AC
  Administered 2013-08-29: 1000 mL via INTRAVENOUS

## 2013-08-29 MED ORDER — HYDROMORPHONE HCL PF 2 MG/ML IJ SOLN
2.0000 mg | Freq: Once | INTRAMUSCULAR | Status: AC
  Administered 2013-08-29: 2 mg via INTRAVENOUS
  Filled 2013-08-29: qty 1

## 2013-08-29 NOTE — ED Notes (Signed)
Per EMS: Pt reports falling this morning between 6-7am, however his wife went to work and she found him this evening when returning home at 1700. Pt reports hitting the back of his head and complains of left hip pain. EMS denies softening, rotation, or deformity of the left leg. Pt reports neck pain, however reports this as chronic due to fused disc. Pt was given 50 mcg of fentanyl IV and 50 mcg intranasally. Pt denies being on any blood thinning medication.

## 2013-08-29 NOTE — ED Provider Notes (Signed)
CSN: [id number]     Arrival date & time 08/29/13  1947 History   First MD Initiated Contact with Patient 08/29/13 1950     Chief Complaint  Patient presents with  . Fall  . Hip Pain     (Consider location/radiation/quality/duration/timing/severity/associated sxs/prior Treatment) HPI  63-year-old male brought in for evaluation after fall. Patient states that he rolled out of bed and was unable to get up. He was on the ground for several hours before his wife found him. Patient has many chronic medical conditions and has a home nurse. Chronic pain on high doses of opiates. He is complaining primarily of back pain, but denies any acute change since he fell. He does not think he struck his head. He does has redness of the left side of his face. His wife states that the space was touching the ground she found him. He does not appear to be confused per her. Patient is requesting pain medication as he was unable to take his home medicines as scheduled.  Past Medical History  Diagnosis Date  . Hyperlipemia   . Chronic back pain   . GERD (gastroesophageal reflux disease)   . Anxiety   . Chronic narcotic dependence   . Hypothyroidism   . Depression   . Myoclonus   . UTI (lower urinary tract infection) 5/14    [place]  Mar[place] Kitchen. Acute encephalopathy 09/06/12    [facility]  . Foot drop, bilateral   . Shortness of breath     with exertion  . CHF (congestive heart failure)     diurects stopped in April  . DVT (deep venous thrombosis)     01/2012  . Seizures     Myoclonus  . Neuropathy     legs  . Arthritis   . Memory loss of unknown cause   . AVN (avascular necrosis of bone)     right  . Obese   . History of cerebrovascular disease   . Abnormality of gait 08/03/2013   Past Surgical History  Procedure Laterality Date  . Back surgery      4 lumbar  . Hip surgery Left   . Cervical fusion    . Knee arthroscopy Right   . Joint replacement    . Pain pump  implantation  Reports no longer has pump  . Tooth extraction N/A 11/16/2012    Procedure: DENTAL EXTRACTIONS OF MULTIPLE NONRESTORABLE TEETH;  Surgeon: Hinton DyerJoseph L Miller, DDS;  Location: [facility] OR;  Service: Oral Surgery;  Laterality: N/A;  #12, 14, 15, 22, 23, 24, 25, 26, 27, 28   Family History  Problem Relation Age of Onset  . CAD Father   . Heart disease Father   . Heart failure Mother   . Breast cancer Sister   . CAD Brother    History  Substance Use Topics  . Smoking status: Never Smoker   . Smokeless tobacco: Never Used  . Alcohol Use: Yes     Comment: OCCASSIONAL    Review of Systems  All systems reviewed and negative, other than as noted in HPI.   Allergies  Ceftin and Statins  Home Medications   Prior to Admission medications   Medication Sig Start Date End Date Taking? Authorizing Provider  carisoprodol (SOMA) 350 MG tablet Take 1 tablet (350 mg total) by mouth 3 (three) times daily as needed for muscle spasms. 02/15/13  Yes Costin Otelia SergeantM Gherghe, MD  diazepam (VALIUM) 2 MG  tablet Take 1 tablet (2 mg total) by mouth every 8 (eight) hours as needed for anxiety. 02/15/13  Yes Costin Otelia Sergeant, MD  DULoxetine (CYMBALTA) 60 MG capsule Take 120 mg by mouth every morning.    Yes Historical Provider, MD  Eszopiclone (ESZOPICLONE) 3 MG TABS Take 3 mg by mouth at bedtime. Take immediately before bedtime   Yes Historical Provider, MD  ezetimibe (ZETIA) 10 MG tablet Take 10 mg by mouth daily.   Yes Historical Provider, MD  levETIRAcetam (KEPPRA) 1000 MG tablet Take 1,000 mg by mouth 2 (two) times daily.   Yes Historical Provider, MD  Multiple Vitamin (MULTIVITAMIN WITH MINERALS) TABS tablet Take 1 tablet by mouth every morning.    Yes Historical Provider, MD  omeprazole (PRILOSEC) 20 MG capsule Take 20 mg by mouth every morning.    Yes Historical Provider, MD  oxymorphone (OPANA ER) 30 MG 12 hr tablet Take 30 mg by mouth 3 (three) times daily.   Yes Historical Provider, MD  propranolol  (INDERAL) 20 MG tablet Take 20 mg by mouth 2 (two) times daily.   Yes Historical Provider, MD  Tapentadol HCl (NUCYNTA) 100 MG TABS Take 1 tablet by mouth 3 (three) times daily.   Yes Historical Provider, MD  temazepam (RESTORIL) 15 MG capsule Take 1 capsule (15 mg total) by mouth at bedtime. 02/15/13  Yes Costin Otelia Sergeant, MD   BP 114/73  Pulse 104  Temp(Src) 99.3 F (37.4 C) (Oral)  Resp 16  SpO2 97% Physical Exam  Nursing note and vitals reviewed. Constitutional: He appears well-developed and well-nourished. No distress.  HENT:  Head: Normocephalic.  Mild erythema to the left side of his face. No bony tenderness.  Eyes: Conjunctivae are normal. Right eye exhibits no discharge. Left eye exhibits no discharge.  Neck: Neck supple.  Cardiovascular: Normal rate, regular rhythm and normal heart sounds.  Exam reveals no gallop and no friction rub.   No murmur heard. Pulmonary/Chest: Effort normal and breath sounds normal. No respiratory distress.  Abdominal: Soft. He exhibits no distension. There is no tenderness.  Musculoskeletal: He exhibits no edema and no tenderness.  No midline spinal tenderness. No apparent bony tenderness of the extremities showed pain with range of motion large joints.  Neurological: He is alert.  Skin: Skin is warm and dry.  Psychiatric: He has a normal mood and affect. His behavior is normal. Thought content normal.    ED Course  Procedures (including critical care time) Labs Review Labs Reviewed  BASIC METABOLIC PANEL - Abnormal; Notable for the following:    Glucose, Bld 124 (*)    All other components within normal limits  CBC WITH DIFFERENTIAL  CK    Imaging Review No results found.``  Ct Head Wo Contrast  08/29/2013   CLINICAL DATA:  Fall.  EXAM: CT HEAD WITHOUT CONTRAST  TECHNIQUE: Contiguous axial images were obtained from the base of the skull through the vertex without intravenous contrast.  COMPARISON:  CT head 02/10/2013  FINDINGS:  Age-appropriate atrophy. Negative for hydrocephalus. Negative for acute or chronic infarct. Negative for hemorrhage or mass. Negative for skull fracture.  IMPRESSION: No acute abnormality.   Electronically Signed   By: Marlan Palau M.D.   On: 08/29/2013 20:31     MDM   Final diagnoses:  Fall  Multiple contusions  Elevated CK    [age]-year-old male with multiple contusions after fall. He is laying for several hours. His CK is elevated, but his renal function is normal.  He is urinating without complaint in his urine is clear.. Potassium is normal. Repeat CK did not show any further elevation. EKG with IV fluids and pain medication. He would like to go home. I think this is reasonable. His wife is agreeable with this plan as well. Return precautions were discussed.    Raeford RazorStephen Zonia Caplin, MD 09/04/13 [phone]

## 2013-08-29 NOTE — Discharge Instructions (Signed)

## 2013-08-30 ENCOUNTER — Encounter (HOSPITAL_COMMUNITY): Payer: Self-pay | Admitting: Emergency Medicine

## 2013-08-30 ENCOUNTER — Emergency Department (HOSPITAL_COMMUNITY): Payer: Medicare Other

## 2013-08-30 ENCOUNTER — Observation Stay (HOSPITAL_COMMUNITY)
Admission: EM | Admit: 2013-08-30 | Discharge: 2013-08-31 | Disposition: A | Discharge status: Home / Self Care | Payer: Medicare Other | Attending: Internal Medicine | Admitting: Internal Medicine

## 2013-08-30 DIAGNOSIS — R0789 Other chest pain: Secondary | ICD-10-CM

## 2013-08-30 DIAGNOSIS — R5381 Other malaise: Secondary | ICD-10-CM | POA: Diagnosis not present

## 2013-08-30 DIAGNOSIS — Z79899 Other long term (current) drug therapy: Secondary | ICD-10-CM | POA: Insufficient documentation

## 2013-08-30 DIAGNOSIS — D72829 Elevated white blood cell count, unspecified: Secondary | ICD-10-CM | POA: Insufficient documentation

## 2013-08-30 DIAGNOSIS — I509 Heart failure, unspecified: Secondary | ICD-10-CM | POA: Diagnosis not present

## 2013-08-30 DIAGNOSIS — Z86718 Personal history of other venous thrombosis and embolism: Secondary | ICD-10-CM | POA: Insufficient documentation

## 2013-08-30 DIAGNOSIS — R569 Unspecified convulsions: Secondary | ICD-10-CM | POA: Insufficient documentation

## 2013-08-30 DIAGNOSIS — E785 Hyperlipidemia, unspecified: Secondary | ICD-10-CM | POA: Insufficient documentation

## 2013-08-30 DIAGNOSIS — J189 Pneumonia, unspecified organism: Secondary | ICD-10-CM | POA: Diagnosis not present

## 2013-08-30 DIAGNOSIS — F3289 Other specified depressive episodes: Secondary | ICD-10-CM | POA: Diagnosis not present

## 2013-08-30 DIAGNOSIS — M6282 Rhabdomyolysis: Secondary | ICD-10-CM

## 2013-08-30 DIAGNOSIS — R5383 Other fatigue: Secondary | ICD-10-CM

## 2013-08-30 DIAGNOSIS — Z9181 History of falling: Secondary | ICD-10-CM | POA: Diagnosis not present

## 2013-08-30 DIAGNOSIS — R269 Unspecified abnormalities of gait and mobility: Secondary | ICD-10-CM

## 2013-08-30 DIAGNOSIS — R197 Diarrhea, unspecified: Secondary | ICD-10-CM

## 2013-08-30 DIAGNOSIS — E669 Obesity, unspecified: Secondary | ICD-10-CM | POA: Insufficient documentation

## 2013-08-30 DIAGNOSIS — F329 Major depressive disorder, single episode, unspecified: Secondary | ICD-10-CM | POA: Diagnosis not present

## 2013-08-30 DIAGNOSIS — E876 Hypokalemia: Secondary | ICD-10-CM

## 2013-08-30 DIAGNOSIS — F32A Depression, unspecified: Secondary | ICD-10-CM | POA: Diagnosis present

## 2013-08-30 DIAGNOSIS — K219 Gastro-esophageal reflux disease without esophagitis: Secondary | ICD-10-CM | POA: Diagnosis not present

## 2013-08-30 DIAGNOSIS — Z981 Arthrodesis status: Secondary | ICD-10-CM | POA: Insufficient documentation

## 2013-08-30 DIAGNOSIS — J9600 Acute respiratory failure, unspecified whether with hypoxia or hypercapnia: Principal | ICD-10-CM | POA: Insufficient documentation

## 2013-08-30 DIAGNOSIS — G8929 Other chronic pain: Secondary | ICD-10-CM | POA: Diagnosis not present

## 2013-08-30 DIAGNOSIS — M549 Dorsalgia, unspecified: Secondary | ICD-10-CM | POA: Diagnosis not present

## 2013-08-30 DIAGNOSIS — N390 Urinary tract infection, site not specified: Secondary | ICD-10-CM

## 2013-08-30 DIAGNOSIS — R0902 Hypoxemia: Secondary | ICD-10-CM

## 2013-08-30 DIAGNOSIS — E039 Hypothyroidism, unspecified: Secondary | ICD-10-CM | POA: Diagnosis not present

## 2013-08-30 DIAGNOSIS — F411 Generalized anxiety disorder: Secondary | ICD-10-CM | POA: Insufficient documentation

## 2013-08-30 DIAGNOSIS — G9340 Encephalopathy, unspecified: Secondary | ICD-10-CM

## 2013-08-30 DIAGNOSIS — R7881 Bacteremia: Secondary | ICD-10-CM

## 2013-08-30 DIAGNOSIS — J40 Bronchitis, not specified as acute or chronic: Secondary | ICD-10-CM

## 2013-08-30 LAB — BLOOD GAS, ARTERIAL
Acid-Base Excess: 1.8 mmol/L (ref 0.0–2.0)
Bicarbonate: 27.7 mEq/L — ABNORMAL HIGH (ref 20.0–24.0)
Drawn by: 257701
FIO2: 0.21 %
O2 Saturation: 86.5 %
PH ART: 7.352 (ref 7.350–7.450)
Patient temperature: 98.6
TCO2: 24.9 mmol/L (ref 0–100)
pCO2 arterial: 51.3 mmHg — ABNORMAL HIGH (ref 35.0–45.0)
pO2, Arterial: 51.3 mmHg — ABNORMAL LOW (ref 80.0–100.0)

## 2013-08-30 LAB — CBC WITH DIFFERENTIAL/PLATELET
Basophils Absolute: 0 10*3/uL (ref 0.0–0.1)
Basophils Relative: 0 % (ref 0–1)
EOS PCT: 0 % (ref 0–5)
Eosinophils Absolute: 0 10*3/uL (ref 0.0–0.7)
HEMATOCRIT: 43.9 % (ref 39.0–52.0)
Hemoglobin: 14.6 g/dL (ref 13.0–17.0)
LYMPHS ABS: 2.6 10*3/uL (ref 0.7–4.0)
LYMPHS PCT: 24 % (ref 12–46)
MCH: 28.6 pg (ref 26.0–34.0)
MCHC: 33.3 g/dL (ref 30.0–36.0)
MCV: 86.1 fL (ref 78.0–100.0)
MONO ABS: 0.8 10*3/uL (ref 0.1–1.0)
Monocytes Relative: 7 % (ref 3–12)
Neutro Abs: 7.5 10*3/uL (ref 1.7–7.7)
Neutrophils Relative %: 69 % (ref 43–77)
Platelets: 161 10*3/uL (ref 150–400)
RBC: 5.1 MIL/uL (ref 4.22–5.81)
RDW: 13.2 % (ref 11.5–15.5)
WBC: 10.9 10*3/uL — AB (ref 4.0–10.5)

## 2013-08-30 LAB — BASIC METABOLIC PANEL
BUN: 12 mg/dL (ref 6–23)
CALCIUM: 8.7 mg/dL (ref 8.4–10.5)
CHLORIDE: 101 meq/L (ref 96–112)
CO2: 27 meq/L (ref 19–32)
Creatinine, Ser: 0.77 mg/dL (ref 0.50–1.35)
GFR calc Af Amer: >90 mL/min (ref >90)
GFR calc non Af Amer: >90 mL/min (ref >90)
Glucose, Bld: 106 mg/dL — ABNORMAL HIGH (ref 70–99)
Potassium: 4.4 mEq/L (ref 3.7–5.3)
Sodium: 138 mEq/L (ref 137–147)

## 2013-08-30 LAB — I-STAT TROPONIN, ED: Troponin i, poc: 0.07 ng/mL (ref 0.00–0.08)

## 2013-08-30 LAB — CK: Total CK: 2972 U/L — ABNORMAL HIGH (ref 7–232)

## 2013-08-30 LAB — PRO B NATRIURETIC PEPTIDE: PRO B NATRI PEPTIDE: 200.3 pg/mL — AB (ref 0–125)

## 2013-08-30 MED ORDER — ONDANSETRON HCL 4 MG/2ML IJ SOLN: 4.0000 mg | Freq: Four times a day (QID) | INTRAMUSCULAR | Status: DC | PRN

## 2013-08-30 MED ORDER — EZETIMIBE 10 MG PO TABS
10.0000 mg | ORAL_TABLET | Freq: Every day | ORAL | Status: DC
  Administered 2013-08-30 – 2013-08-31 (×2): 10 mg via ORAL
  Filled 2013-08-30 (×2): qty 1

## 2013-08-30 MED ORDER — ALBUTEROL SULFATE (2.5 MG/3ML) 0.083% IN NEBU
5.0000 mg | INHALATION_SOLUTION | Freq: Once | RESPIRATORY_TRACT | Status: AC
  Administered 2013-08-30: 5 mg via RESPIRATORY_TRACT
  Filled 2013-08-30: qty 6

## 2013-08-30 MED ORDER — ALBUTEROL SULFATE (2.5 MG/3ML) 0.083% IN NEBU: 2.5000 mg | INHALATION_SOLUTION | Freq: Four times a day (QID) | RESPIRATORY_TRACT | Status: DC | PRN

## 2013-08-30 MED ORDER — CARISOPRODOL 350 MG PO TABS
350.0000 mg | ORAL_TABLET | Freq: Three times a day (TID) | ORAL | Status: DC | PRN
  Administered 2013-08-31: 350 mg via ORAL
  Filled 2013-08-30: qty 1

## 2013-08-30 MED ORDER — PANTOPRAZOLE SODIUM 40 MG PO TBEC
40.0000 mg | DELAYED_RELEASE_TABLET | Freq: Every day | ORAL | Status: DC
  Administered 2013-08-30 – 2013-08-31 (×2): 40 mg via ORAL
  Filled 2013-08-30 (×2): qty 1

## 2013-08-30 MED ORDER — DOXYCYCLINE HYCLATE 100 MG PO TABS
100.0000 mg | ORAL_TABLET | Freq: Two times a day (BID) | ORAL | Status: DC
  Administered 2013-08-30: 100 mg via ORAL
  Filled 2013-08-30 (×4): qty 1

## 2013-08-30 MED ORDER — PROPRANOLOL HCL 20 MG PO TABS
20.0000 mg | ORAL_TABLET | Freq: Two times a day (BID) | ORAL | Status: DC
  Administered 2013-08-30 – 2013-08-31 (×2): 20 mg via ORAL
  Filled 2013-08-30 (×3): qty 1

## 2013-08-30 MED ORDER — IOHEXOL 350 MG/ML SOLN
100.0000 mL | Freq: Once | INTRAVENOUS | Status: AC | PRN
  Administered 2013-08-30: 100 mL via INTRAVENOUS

## 2013-08-30 MED ORDER — SODIUM CHLORIDE 0.9 % IJ SOLN: 3.0000 mL | INTRAMUSCULAR | Status: DC | PRN

## 2013-08-30 MED ORDER — ALBUTEROL SULFATE (2.5 MG/3ML) 0.083% IN NEBU
2.5000 mg | INHALATION_SOLUTION | Freq: Four times a day (QID) | RESPIRATORY_TRACT | Status: DC
  Administered 2013-08-30: 2.5 mg via RESPIRATORY_TRACT
  Filled 2013-08-30: qty 3

## 2013-08-30 MED ORDER — HEPARIN SODIUM (PORCINE) 5000 UNIT/ML IJ SOLN
5000.0000 [IU] | Freq: Three times a day (TID) | INTRAMUSCULAR | Status: DC
Start: 2013-08-30 — End: 2013-08-31
  Administered 2013-08-30 – 2013-08-31 (×2): 5000 [IU] via SUBCUTANEOUS
  Filled 2013-08-30 (×5): qty 1

## 2013-08-30 MED ORDER — MORPHINE SULFATE ER 30 MG PO TBCR
30.0000 mg | EXTENDED_RELEASE_TABLET | Freq: Three times a day (TID) | ORAL | Status: DC
  Administered 2013-08-30 – 2013-08-31 (×2): 30 mg via ORAL
  Filled 2013-08-30 (×2): qty 1

## 2013-08-30 MED ORDER — FUROSEMIDE 10 MG/ML IJ SOLN
20.0000 mg | Freq: Once | INTRAMUSCULAR | Status: AC
  Administered 2013-08-30: 20 mg via INTRAVENOUS
  Filled 2013-08-30: qty 2

## 2013-08-30 MED ORDER — LEVETIRACETAM 500 MG PO TABS
1000.0000 mg | ORAL_TABLET | Freq: Two times a day (BID) | ORAL | Status: DC
  Administered 2013-08-30 – 2013-08-31 (×2): 1000 mg via ORAL
  Filled 2013-08-30 (×3): qty 2

## 2013-08-30 MED ORDER — ACETAMINOPHEN 650 MG RE SUPP: 650.0000 mg | Freq: Four times a day (QID) | RECTAL | Status: DC | PRN

## 2013-08-30 MED ORDER — ALBUTEROL SULFATE (2.5 MG/3ML) 0.083% IN NEBU
2.5000 mg | INHALATION_SOLUTION | Freq: Two times a day (BID) | RESPIRATORY_TRACT | Status: DC
  Administered 2013-08-31: 2.5 mg via RESPIRATORY_TRACT
  Filled 2013-08-30: qty 3

## 2013-08-30 MED ORDER — ACETAMINOPHEN 325 MG PO TABS: 650.0000 mg | ORAL_TABLET | Freq: Four times a day (QID) | ORAL | Status: DC | PRN

## 2013-08-30 MED ORDER — LEVOFLOXACIN IN D5W 750 MG/150ML IV SOLN
750.0000 mg | Freq: Once | INTRAVENOUS | Status: AC
  Administered 2013-08-30: 750 mg via INTRAVENOUS
  Filled 2013-08-30 (×2): qty 150

## 2013-08-30 MED ORDER — OXYCODONE HCL 5 MG PO TABS
10.0000 mg | ORAL_TABLET | Freq: Four times a day (QID) | ORAL | Status: DC | PRN
  Administered 2013-08-31: 10 mg via ORAL
  Filled 2013-08-30: qty 2

## 2013-08-30 MED ORDER — SODIUM CHLORIDE 0.9 % IJ SOLN: 3.0000 mL | Freq: Two times a day (BID) | INTRAMUSCULAR | Status: DC

## 2013-08-30 MED ORDER — SODIUM CHLORIDE 0.9 % IV SOLN: 250.0000 mL | INTRAVENOUS | Status: DC | PRN

## 2013-08-30 MED ORDER — ADULT MULTIVITAMIN W/MINERALS CH
1.0000 | ORAL_TABLET | Freq: Every morning | ORAL | Status: DC
  Administered 2013-08-31: 1 via ORAL
  Filled 2013-08-30: qty 1

## 2013-08-30 MED ORDER — ONDANSETRON HCL 4 MG PO TABS: 4.0000 mg | ORAL_TABLET | Freq: Four times a day (QID) | ORAL | Status: DC | PRN

## 2013-08-30 MED ORDER — DULOXETINE HCL 60 MG PO CPEP
120.0000 mg | ORAL_CAPSULE | Freq: Every morning | ORAL | Status: DC
  Administered 2013-08-31: 120 mg via ORAL
  Filled 2013-08-30: qty 2

## 2013-08-30 MED ORDER — SODIUM CHLORIDE 0.9 % IV BOLUS (SEPSIS)
500.0000 mL | Freq: Once | INTRAVENOUS | Status: AC
  Administered 2013-08-30: 500 mL via INTRAVENOUS

## 2013-08-30 MED ORDER — DIAZEPAM 2 MG PO TABS
2.0000 mg | ORAL_TABLET | Freq: Two times a day (BID) | ORAL | Status: DC | PRN
  Administered 2013-08-31: 2 mg via ORAL
  Filled 2013-08-30: qty 1

## 2013-08-30 NOTE — H&P (Signed)
[facility] History and Physical  Alex BlamerJohn T Douthit WUJ:[id number] DOB: April 27, 1951 DOA: 08/30/2013  Referring physician: Dr. Denton LankSteinl  PCP: Neldon LabellaMILLER,LISA LYNN, MD   Chief Complaint: Hypoxia  HPI: Alex Mcintosh is a 63 y.o. male with past medical history significant for depression, GERD, anxiety, chronic back pain, hyperlipidemia, history of DVT and CHF (diastolic in nature with last 2-D echo done in September 2014 with preserved ejection fraction 50-55% at that time; moderate LVH appreciated); came to the hospital for further evaluation after he was found to be hypoxic by his home Nurse. Patient had a history of recent falling episode day prior to admission before he was assessed in the admission department with subsequent discharge back home. During this ED visit patient was found to had mild rhabdomyolysis and received 3 L of fluids prior to being discharge. Patient denies any chest pain, any significant changes on his breathing, no significant changes on his chronic lower extremity edema (per patient and wife depending his position can be up to 1+ bilaterally); no fever, no cough, no orthopnea, no nausea, no vomiting, no abdominal pain, no dysuria, no melena or any other acute complaints.  In the ED patient oxygen saturation was low to mid 80s on room air and he had ABG test that demonstrated pH 7.352, CO2 51.3 and O2 of 51. Chest x-ray suggested bronchitic changes and atelectasis; patient with mild leukocytosis (WBCs 10.9) and marginal elevation of his BNP. [facility] has been called to admit the patient for further evaluation and treatment.    Review of Systems:  Negative except as otherwise mentioned on history of present illness.  Past Medical History  Diagnosis Date  . Hyperlipemia   . Chronic back pain   . GERD (gastroesophageal reflux disease)   . Anxiety   . Chronic narcotic dependence   . Hypothyroidism   . Depression   . Myoclonus   . UTI (lower urinary tract infection)  5/14    [place]  Mar[place] Kitchen. Acute encephalopathy 09/06/12    [facility]  . Foot drop, bilateral   . Shortness of breath     with exertion  . CHF (congestive heart failure)     diurects stopped in April  . DVT (deep venous thrombosis)     01/2012  . Seizures     Myoclonus  . Neuropathy     legs  . Arthritis   . Memory loss of unknown cause   . AVN (avascular necrosis of bone)     right  . Obese   . History of cerebrovascular disease   . Abnormality of gait 08/03/2013   Past Surgical History  Procedure Laterality Date  . Back surgery      4 lumbar  . Hip surgery Left   . Cervical fusion    . Knee arthroscopy Right   . Joint replacement    . Pain pump implantation  Reports no longer has pump  . Tooth extraction N/A 11/16/2012    Procedure: DENTAL EXTRACTIONS OF MULTIPLE NONRESTORABLE TEETH;  Surgeon: Hinton DyerJoseph L Miller, DDS;  Location: [facility] OR;  Service: Oral Surgery;  Laterality: N/A;  #12, 14, 15, 22, 23, 24, 25, 26, 27, 28   Social History:  reports that he has never smoked. He has never used smokeless tobacco. He reports that he drinks alcohol. He reports that he does not use illicit drugs. lives at home with his wife and use a walker assistance device for ambulation. Patient  is very sedentary and require assistance with some of his activities of daily living.  Allergies  Allergen Reactions  . Ceftin [Cefuroxime Axetil] Hives  . Statins Other (See Comments)    Messes with liver enzymes    Family History  Problem Relation Age of Onset  . CAD Father   . Heart disease Father   . Heart failure Mother   . Breast cancer Sister   . CAD Brother      Prior to Admission medications   Medication Sig Start Date End Date Taking? Authorizing Provider  carisoprodol (SOMA) 350 MG tablet Take 1 tablet (350 mg total) by mouth 3 (three) times daily as needed for muscle spasms. 02/15/13  Yes Costin Otelia Sergeant, MD  diazepam (VALIUM) 2 MG tablet Take 1 tablet (2 mg  total) by mouth every 8 (eight) hours as needed for anxiety. 02/15/13  Yes Costin Otelia Sergeant, MD  DULoxetine (CYMBALTA) 60 MG capsule Take 120 mg by mouth every morning.    Yes Historical Provider, MD  Eszopiclone (ESZOPICLONE) 3 MG TABS Take 3 mg by mouth at bedtime. Take immediately before bedtime   Yes Historical Provider, MD  ezetimibe (ZETIA) 10 MG tablet Take 10 mg by mouth daily.   Yes Historical Provider, MD  levETIRAcetam (KEPPRA) 1000 MG tablet Take 1,000 mg by mouth 2 (two) times daily.   Yes Historical Provider, MD  Multiple Vitamin (MULTIVITAMIN WITH MINERALS) TABS tablet Take 1 tablet by mouth every morning.    Yes Historical Provider, MD  omeprazole (PRILOSEC) 20 MG capsule Take 20 mg by mouth every morning.    Yes Historical Provider, MD  oxymorphone (OPANA ER) 30 MG 12 hr tablet Take 30 mg by mouth 3 (three) times daily.   Yes Historical Provider, MD  propranolol (INDERAL) 20 MG tablet Take 20 mg by mouth 2 (two) times daily.   Yes Historical Provider, MD  Tapentadol HCl (NUCYNTA) 100 MG TABS Take 1 tablet by mouth 3 (three) times daily.   Yes Historical Provider, MD  temazepam (RESTORIL) 15 MG capsule Take 1 capsule (15 mg total) by mouth at bedtime. 02/15/13  Yes Costin Otelia Sergeant, MD   Physical Exam: Filed Vitals:   08/30/13 1427  BP:   Pulse: 81  Temp:   Resp:     BP 107/66  Pulse 81  Temp(Src) 98.1 F (36.7 C) (Oral)  Resp 17  SpO2 96%  General:  Appears calm and comfortable; afebrile and in NAD. Eyes: PERRL, normal lids, irises & conjunctiva, no nystagmus, no icterus ENT: grossly normal hearing, lips & tongue; poor dentition, no drainage out of ears or nostrils, MMM. No erythema or exudates inside his mouth. Neck: no LAD, masses or thyromegaly; due to body habitus unable to thoroughly assess JVD's Cardiovascular: RRR, no m/r/g. Trace to 1+ LE edema bilaterally. Respiratory: Fine crackles at bases bilaterally, scattered rhonchi; otherwise CTA. Normal respiratory  effort and no work of breathing appreciated. Abdomen: Obese, soft, nt, nd,  Positive bowel sounds; no rebound or guarding Skin: no rash or induration seen on limited exam Musculoskeletal: grossly normal tone BUE/BLE Psychiatric: grossly normal mood and affect, speech fluent and appropriate Neurologic: grossly non-focal. Muscle strength 3-4/5 secondary to poor effort; no focal deficit appreciated and per patient/wife at bedside no new weakness          Labs on Admission:  Basic Metabolic Panel:  Recent Labs Lab 08/29/13 2030 08/30/13 1550  NA 143 138  K 5.3 4.4  CL 104 101  CO2 30 27  GLUCOSE 124* 106*  BUN 11 12  CREATININE 0.79 0.77  CALCIUM 9.1 8.7   CBC:  Recent Labs Lab 08/29/13 2030 08/30/13 1550  WBC 9.8 10.9*  NEUTROABS 6.3 7.5  HGB 14.0 14.6  HCT 42.8 43.9  MCV 85.3 86.1  PLT 156 161   Cardiac Enzymes:  Recent Labs Lab 08/29/13 2030 08/29/13 2240 08/30/13 1550  CKTOTAL 2702* 2658* 2972*    BNP (last 3 results)  Recent Labs  08/30/13 1553  PROBNP 200.3*   Radiological Exams on Admission: Dg Chest 2 View  08/30/2013   CLINICAL DATA:  Hypoxemia.  History of congestive heart failure.  EXAM: CHEST  2 VIEW  COMPARISON:  02/12/2013.  FINDINGS: There is stable cardiomegaly. There are lower lung volumes with increased central airway thickening and mild bibasilar atelectasis. No edema, confluent airspace opacity or significant pleural effusion is present.  IMPRESSION: Increased bibasilar atelectasis and central airway thickening suggesting bronchitis. No evidence of edema or pneumonia.   Electronically Signed   By: Roxy Horseman M.D.   On: 08/30/2013 14:44   Ct Head Wo Contrast  08/29/2013   CLINICAL DATA:  Fall.  EXAM: CT HEAD WITHOUT CONTRAST  TECHNIQUE: Contiguous axial images were obtained from the base of the skull through the vertex without intravenous contrast.  COMPARISON:  CT head 02/10/2013  FINDINGS: Age-appropriate atrophy. Negative for  hydrocephalus. Negative for acute or chronic infarct. Negative for hemorrhage or mass. Negative for skull fracture.  IMPRESSION: No acute abnormality.   Electronically Signed   By: Marlan Palau M.D.   On: 08/29/2013 20:31    EKG:  Sinus rhythm, no acute ischemic changes appreciated and no significant changes from prior EKG tracing.  Assessment/Plan 1-Hypoxia: History of falling episode on 08/29/2013 or patient has been seen in the ED and Due to mild rhabdomyolysis received 3 L of fluids prior to being discharge home. Presented today with fine crackles at the bases, and some atelectasis/bronchitic changes on chest x-ray. Other concerns for patient hypoxia includes obesity hypoventilation syndrome giving his body habitus, sleep apnea and too much narcotics for his chronic pain impairing his ability for good inspirations. Patient is also very sedentary and had history of DVT in the past. -Patient oxygen saturation has been in the mid 80s on room air and has been requiring 2 L of oxygen. -Will check CT angio of the chest -empirically doxycycline for bronchitis/early PNA (CT will also help determine cause of hypoxia) -BNP marginally elevated; but fine crackles on exam and patient received 3L of fluid overnight; will give one dose lasix IV and follow daily weight, I's &O's and low sodium diet -Will start IS and flutter valve -further recommendation and treatment decisions depending clinical response and results from current work up. -will benefit of sleep study as an outpatient  2-Hyperlipidemia: patient with hx of statins allergies. Advise to follow low fat diet and to use fish oil -will check lipid panel  3-Chronic pain: will continue ER morphine TID (but dose will be slightly lower than at home. Will also cut off schedule breakthrough pain meds and use oxycodone 10mg  Q6H PRN.  -had discussion with patient regarding this changes  4-Convulsions/seizures: continue keppra. No hx of any convulsions  recently  5-Bronchitis/early PNA: with ability to take PO's. Will use doxycycline BID and treat for 7 days -PRN albuterol has been ordered -start IS/flutter valve -oxygen supplementation as needed with active weaning to RA as tolerated  6-Depression and anxiety: continue cymbalta and  PRN valium  7-GERD (gastroesophageal reflux disease): continue PPI  8-Obesity: discussed with patient regarding low calorie diet   9-generalized weakness and limited mobility: will ask PT to evaluate patient for safety needs at discharge. Currently use a walker for ambulation at home.   DVT: Heparin  Code Status: DNR Family Communication: wife at bedside Disposition Plan: observation, LOS < 2 midnights, med-surg bed  Time spent: 50 minutes  Vassie Lollarlos Jessalyn Hinojosa [facility] Pager [phone]

## 2013-08-30 NOTE — ED Notes (Signed)
Respiratory notified about neb treatment. 

## 2013-08-30 NOTE — ED Notes (Signed)
Made Kirichenko, PA aware of patient I Stat troponin results.

## 2013-08-30 NOTE — Progress Notes (Signed)
Called ED to get report on patient and was put on hold for 5 min. Will attempt to get report on patient once more after shift change.

## 2013-08-30 NOTE — ED Notes (Signed)
After taking a few deep breaths, pt's O2 sat quickly raises to 96%, then will drop back down to high 80's.

## 2013-08-30 NOTE — ED Notes (Addendum)
Per patient- home health nurse came to patient's house today. O2 saturation 85% at home. Patient reports "I haven't noticed my breathing being different." He does take pain medication at home but says he had not taken these medications around the time his O2 saturation decreased. Home health nurse reports "crackles." Nurse called PCP. PCP recommended patient be seen at hospital. Patient in NAD.

## 2013-08-30 NOTE — Progress Notes (Signed)
Post ABG on RA (PO2 51.3)- RT placed PT on 2 lpm [place]. Current Sp02 93%.

## 2013-08-30 NOTE — ED Provider Notes (Signed)
CSN: [id number]     Arrival date & time 08/30/13  1339 History   First MD Initiated Contact with Patient 08/30/13 1502     Chief Complaint  Patient presents with  . Low Oxygen Saturation      (Consider location/radiation/quality/duration/timing/severity/associated sxs/prior Treatment) HPI Alex Mcintosh is a 63 y.o. male, with multiple medical problems, who comes in to emergency department because of low oxygen saturation at home. Patient lives at home, and has a home health nurse that comes for home visits. Patient states that home health nurse came in today, and fell on his oxygen saturation to be 85% on room air. According to patient's significant other, patient has been very sedated today. She states that patient is sedated like this frequently, and I think it is because of his medications. Patient however does not recall if he took his medications today or not. She did have a fall yesterday, was seen in emergency department and discharged home. Patient denies any new pain anywhere, he denies any shortness of breath, denies any chest pain, denies any fever, chills, cough. Patient has no complaints. Patient is in general poor health, walks only with a walker, and states he's not very stable with his gait. States today he has been sleeping most of the morning, and his significant other states that when they checked his vital signs are very he was very sleepy. Patient does not use oxygen at home. He does have history of shortness of breath with exertion, CHF, DVT, Chronic narcotic dependence.   Past Medical History  Diagnosis Date  . Hyperlipemia   . Chronic back pain   . GERD (gastroesophageal reflux disease)   . Anxiety   . Chronic narcotic dependence   . Hypothyroidism   . Depression   . Myoclonus   . UTI (lower urinary tract infection) 5/14    [place]  [place]. Acute encephalopathy 09/06/12    was [facility]  . Foot drop, bilateral   . Shortness of breath      with exertion  . CHF (congestive heart failure)     diurects stopped in April  . DVT (deep venous thrombosis)     01/2012  . Seizures     Myoclonus  . Neuropathy     legs  . Arthritis   . Memory loss of unknown cause   . AVN (avascular necrosis of bone)     right  . Obese   . History of cerebrovascular disease   . Abnormality of gait 08/03/2013   Past Surgical History  Procedure Laterality Date  . Back surgery      4 lumbar  . Hip surgery Left   . Cervical fusion    . Knee arthroscopy Right   . Joint replacement    . Pain pump implantation  Reports no longer has pump  . Tooth extraction N/A 11/16/2012    Procedure: DENTAL EXTRACTIONS OF MULTIPLE NONRESTORABLE TEETH;  Surgeon: Hinton DyerJoseph L Miller, DDS;  Location: [facility] OR;  Service: Oral Surgery;  Laterality: N/A;  #12, 14, 15, 22, 23, 24, 25, 26, 27, 28   Family History  Problem Relation Age of Onset  . CAD Father   . Heart disease Father   . Heart failure Mother   . Breast cancer Sister   . CAD Brother    History  Substance Use Topics  . Smoking status: Never Smoker   . Smokeless tobacco: Never Used  . Alcohol Use: Yes  Comment: OCCASSIONAL    Review of Systems  Constitutional: Negative for fever and chills.  Respiratory: Negative for cough, chest tightness and shortness of breath.   Cardiovascular: Negative for chest pain, palpitations and leg swelling.  Gastrointestinal: Negative for nausea, vomiting, abdominal pain, diarrhea and abdominal distention.  Genitourinary: Negative for dysuria, urgency, frequency and hematuria.  Musculoskeletal: Positive for back pain. Negative for neck pain and neck stiffness.  Skin: Negative for rash.  Allergic/Immunologic: Negative for immunocompromised state.  Neurological: Negative for dizziness, weakness, light-headedness, numbness and headaches.      Allergies  Ceftin and Statins  Home Medications   Prior to Admission medications   Medication Sig Start Date End Date  Taking? Authorizing Provider  carisoprodol (SOMA) 350 MG tablet Take 1 tablet (350 mg total) by mouth 3 (three) times daily as needed for muscle spasms. 02/15/13  Yes Costin Otelia Sergeant, MD  diazepam (VALIUM) 2 MG tablet Take 1 tablet (2 mg total) by mouth every 8 (eight) hours as needed for anxiety. 02/15/13  Yes Costin Otelia Sergeant, MD  DULoxetine (CYMBALTA) 60 MG capsule Take 120 mg by mouth every morning.    Yes Historical Provider, MD  Eszopiclone (ESZOPICLONE) 3 MG TABS Take 3 mg by mouth at bedtime. Take immediately before bedtime   Yes Historical Provider, MD  ezetimibe (ZETIA) 10 MG tablet Take 10 mg by mouth daily.   Yes Historical Provider, MD  levETIRAcetam (KEPPRA) 1000 MG tablet Take 1,000 mg by mouth 2 (two) times daily.   Yes Historical Provider, MD  Multiple Vitamin (MULTIVITAMIN WITH MINERALS) TABS tablet Take 1 tablet by mouth every morning.    Yes Historical Provider, MD  omeprazole (PRILOSEC) 20 MG capsule Take 20 mg by mouth every morning.    Yes Historical Provider, MD  oxymorphone (OPANA ER) 30 MG 12 hr tablet Take 30 mg by mouth 3 (three) times daily.   Yes Historical Provider, MD  propranolol (INDERAL) 20 MG tablet Take 20 mg by mouth 2 (two) times daily.   Yes Historical Provider, MD  Tapentadol HCl (NUCYNTA) 100 MG TABS Take 1 tablet by mouth 3 (three) times daily.   Yes Historical Provider, MD  temazepam (RESTORIL) 15 MG capsule Take 1 capsule (15 mg total) by mouth at bedtime. 02/15/13  Yes Costin Otelia Sergeant, MD   BP 107/66  Pulse 81  Temp(Src) 98.1 F (36.7 C) (Oral)  Resp 17  SpO2 96% Physical Exam  Nursing note and vitals reviewed. Constitutional: He is oriented to person, place, and time. He appears well-developed and well-nourished. No distress.  HENT:  Head: Normocephalic and atraumatic.  Eyes: Conjunctivae are normal.  Neck: Neck supple.  Cardiovascular: Normal rate, regular rhythm and normal heart sounds.   Pulmonary/Chest: Effort normal. No respiratory  distress. He has no wheezes. He has no rales.  Decreased air movement bilaterally.  Abdominal: Soft. Bowel sounds are normal. He exhibits no distension. There is no tenderness. There is no rebound.  Musculoskeletal: He exhibits no edema.  Neurological: He is alert and oriented to person, place, and time.  Skin: Skin is warm and dry.    ED Course  Procedures (including critical care time) Labs Review Labs Reviewed  CBC WITH DIFFERENTIAL - Abnormal; Notable for the following:    WBC 10.9 (*)    All other components within normal limits  BASIC METABOLIC PANEL - Abnormal; Notable for the following:    Glucose, Bld 106 (*)    All other components within normal limits  CK -  Abnormal; Notable for the following:    Total CK 2972 (*)    All other components within normal limits  BLOOD GAS, ARTERIAL - Abnormal; Notable for the following:    pCO2 arterial 51.3 (*)    pO2, Arterial 51.3 (*)    Bicarbonate 27.7 (*)    All other components within normal limits  PRO B NATRIURETIC PEPTIDE - Abnormal; Notable for the following:    Pro B Natriuretic peptide (BNP) 200.3 (*)    All other components within normal limits  LIPID PANEL  BASIC METABOLIC PANEL  CBC  I-STAT TROPOININ, ED    Imaging Review Dg Chest 2 View  08/30/2013   CLINICAL DATA:  Hypoxemia.  History of congestive heart failure.  EXAM: CHEST  2 VIEW  COMPARISON:  02/12/2013.  FINDINGS: There is stable cardiomegaly. There are lower lung volumes with increased central airway thickening and mild bibasilar atelectasis. No edema, confluent airspace opacity or significant pleural effusion is present.  IMPRESSION: Increased bibasilar atelectasis and central airway thickening suggesting bronchitis. No evidence of edema or pneumonia.   Electronically Signed   By: Roxy HorsemanBill  Veazey M.D.   On: 08/30/2013 14:44   Ct Head Wo Contrast  08/29/2013   CLINICAL DATA:  Fall.  EXAM: CT HEAD WITHOUT CONTRAST  TECHNIQUE: Contiguous axial images were obtained  from the base of the skull through the vertex without intravenous contrast.  COMPARISON:  CT head 02/10/2013  FINDINGS: Age-appropriate atrophy. Negative for hydrocephalus. Negative for acute or chronic infarct. Negative for hemorrhage or mass. Negative for skull fracture.  IMPRESSION: No acute abnormality.   Electronically Signed   By: Marlan Palauharles  Clark M.D.   On: 08/29/2013 20:31     EKG Interpretation   Date/Time:  Thursday August 30 2013 15:33:42 EDT Ventricular Rate:  82 PR Interval:  171 QRS Duration: 88 QT Interval:  396 QTC Calculation: 462 R Axis:   29 Text Interpretation:  Sinus rhythm Baseline wander in lead(s) III aVF No  significant change since last tracing Confirmed by Denton LankSTEINL  MD, Caryn BeeKEVIN  ([id number]) on 08/30/2013 4:04:13 PM      MDM   Final diagnoses:  Hypoxia  Rhabdomyolysis  CAP (community acquired pneumonia)   Patient in emergency department with hypoxia. Oxygen saturation in the 80s on room air. Patient denies any complaints. Chest x-ray, labs ordered.   Chest x-ray is negative, labs unremarkable. Patient is maintaining oxygen saturation mid 90s on 2 L. Discussed with Dr. Madaline GuthrieSteiner. We'll take him off of oxygen and get ABG.   ABG showed marked hypoxia. Will admit.   I discussed patient with admitting physician, and asked for CT injury to rule out PE. Patient does have history of DVTs. CT angiogram and showed pneumonia. Cover patient with Levaquin. Patient is admitted. His vital signs remained normal other than hypoxia. He is maintaining oxygen saturation mid 90s on 2 L. Results discussed with patient and his significant other.  Filed Vitals:   08/30/13 1418 08/30/13 1427 08/30/13 2037 08/30/13 2123  BP:   140/83 143/83  Pulse: 85 81 92 71  Temp:    97.6 F (36.4 C)  TempSrc:    Oral  Resp:   18 18  SpO2: 85% 96% 97% 97%       Gwin Eagon A Shonique Pelphrey, PA-C 08/30/13 2345

## 2013-08-31 ENCOUNTER — Other Ambulatory Visit: Payer: Medicare Other | Admitting: Radiology

## 2013-08-31 ENCOUNTER — Telehealth: Payer: Self-pay | Admitting: Radiology

## 2013-08-31 ENCOUNTER — Telehealth: Payer: Self-pay | Admitting: Neurology

## 2013-08-31 DIAGNOSIS — J189 Pneumonia, unspecified organism: Secondary | ICD-10-CM

## 2013-08-31 DIAGNOSIS — J9600 Acute respiratory failure, unspecified whether with hypoxia or hypercapnia: Secondary | ICD-10-CM

## 2013-08-31 LAB — BASIC METABOLIC PANEL WITH GFR
BUN: 8 mg/dL (ref 6–23)
CO2: 28 meq/L (ref 19–32)
Calcium: 8.8 mg/dL (ref 8.4–10.5)
Chloride: 100 meq/L (ref 96–112)
Creatinine, Ser: 0.68 mg/dL (ref 0.50–1.35)
GFR calc Af Amer: >90 mL/min
GFR calc non Af Amer: >90 mL/min
Glucose, Bld: 121 mg/dL — ABNORMAL HIGH (ref 70–99)
Potassium: 4 meq/L (ref 3.7–5.3)
Sodium: 138 meq/L (ref 137–147)

## 2013-08-31 LAB — CBC
HCT: 39.5 % (ref 39.0–52.0)
HEMOGLOBIN: 13.4 g/dL (ref 13.0–17.0)
MCH: 28.4 pg (ref 26.0–34.0)
MCHC: 33.9 g/dL (ref 30.0–36.0)
MCV: 83.7 fL (ref 78.0–100.0)
Platelets: 162 10*3/uL (ref 150–400)
RBC: 4.72 MIL/uL (ref 4.22–5.81)
RDW: 12.7 % (ref 11.5–15.5)
WBC: 7.3 10*3/uL (ref 4.0–10.5)

## 2013-08-31 LAB — LIPID PANEL
Cholesterol: 186 mg/dL (ref 0–200)
HDL: 61 mg/dL
LDL Cholesterol: 103 mg/dL — ABNORMAL HIGH (ref 0–99)
Total CHOL/HDL Ratio: 3 ratio
Triglycerides: 111 mg/dL
VLDL: 22 mg/dL (ref 0–40)

## 2013-08-31 MED ORDER — LEVOFLOXACIN 750 MG PO TABS: 750.0000 mg | ORAL_TABLET | Freq: Every day | ORAL | Status: DC

## 2013-08-31 MED ORDER — LEVOFLOXACIN IN D5W 750 MG/150ML IV SOLN
750.0000 mg | INTRAVENOUS | Status: DC
  Administered 2013-08-31: 750 mg via INTRAVENOUS
  Filled 2013-08-31: qty 150

## 2013-08-31 NOTE — Telephone Encounter (Signed)
Events noted, plans for recheck EEG study, consider adding a second medication for seizures. The patient may require a video EEG monitoring in the future.

## 2013-08-31 NOTE — Progress Notes (Signed)
Pt discharged to home; discharge instructions explained to patient and his wife; a copy of discharge instructions and prescriptions were given to patient; pt and wife verbalized understanding discharge instructions.

## 2013-08-31 NOTE — ED Provider Notes (Signed)
Medical screening examination/treatment/procedure(s) were conducted as a shared visit with non-physician practitioner(s) and myself.  I personally evaluated the patient during the encounter.   EKG Interpretation   Date/Time:  Thursday August 30 2013 15:33:42 EDT Ventricular Rate:  82 PR Interval:  171 QRS Duration: 88 QT Interval:  396 QTC Calculation: 462 R Axis:   29 Text Interpretation:  Sinus rhythm Baseline wander in lead(s) III aVF No  significant change since last tracing Confirmed by Denton LankSTEINL  MD, Caryn BeeKEVIN  ([id number]) on 08/30/2013 4:04:13 PM      Pt noted w low o2 sat by home health care giver. In ed low sats. Pt denies acute dyspnea. No chest pain. Denies fever or chills. Rare non prod cough. Hypoxic on abg. Cxr. Labs. Admit.   Suzi RootsKevin E Alonzo Owczarzak, MD 08/31/13 [id number]

## 2013-08-31 NOTE — Progress Notes (Signed)
PT Cancellation Note  Patient Details Name: Alex BlamerJohn T Mcintosh MRN: [id number] DOB: 1951/04/19   Cancelled Treatment:    Reason Eval/Treat Not Completed: PT screened, no needs identified, will sign off (Per RN pt does not need PT. Will sign off. )   Alvester MorinJennifer K Arlyn Buerkle 08/31/2013, 1:29 PM [phone]

## 2013-08-31 NOTE — Telephone Encounter (Signed)
Patient's wife called to state that [facility]. He had an appointment this morning with Dr. Anne HahnWillis. They will reschedule later.  Wife said that Dr. Anne HahnWillis can contact her if he needs to.

## 2013-08-31 NOTE — Discharge Summary (Addendum)
Physician Discharge Summary  Alex Mcintosh ZOX:[id number] DOB: 1950/06/30 DOA: 08/30/2013  PCP: Neldon LabellaMILLER,LISA LYNN, MD  Admit date: 08/30/2013 Discharge date: 08/31/2013  Time spent: >45 minutes   Discharge Diagnoses:  Principal Problem:   Acute respiratory failure Active Problems:   Hyperlipidemia   Chronic pain   Pneumonia, organism unspecified   Depression   GERD (gastroesophageal reflux disease)   Obesity   Discharge Condition: stable  Diet recommendation: heart healthy  Filed Weights   08/30/13 2037 08/31/13 0610  Weight: 118.298 kg (260 lb 12.8 oz) 119.976 kg (264 lb 8 oz)    History of present illness:  Alex Mcintosh is a 63 y.o. male with past medical history significant for depression, GERD, anxiety, chronic back pain, hyperlipidemia, history of DVT and CHF (diastolic in nature with last 2-D echo done in September 2014 with preserved ejection fraction 50-55% at that time; moderate LVH appreciated); came to the hospital for further evaluation after he was found to be hypoxic by his home Nurse. Patient had a history of recent falling episode day prior to admission before he was assessed in the admission department with subsequent discharge back home. During this ED visit patient was found to had mild rhabdomyolysis and received 3 L of fluids prior to being discharge. Patient denies any chest pain, any significant changes on his breathing, no significant changes on his chronic lower extremity edema (per patient and wife depending his position can be up to 1+ bilaterally); no fever, no cough, no orthopnea, no nausea, no vomiting, no abdominal pain, no dysuria, no melena or any other acute complaints.  In the ED patient oxygen saturation was low to mid 80s on room air and he had ABG test that demonstrated pH 7.352, CO2 51.3 and O2 of 51. Chest x-ray suggested bronchitic changes and atelectasis; patient with mild leukocytosis (WBCs 10.9) and marginal elevation of his BNP. [facility]  has been called to admit the patient for further evaluation and treatment.      Hospital Course:  Community acquired Pneumonia/ acute hypoxic resp failure - essentially asymptomatic except for hypoxia- has been started on Levaquin and will complete a 7 day course.  - PO2 is 94% on room air with exertion on room  Chronic medical issues have remained stable.    Procedures:  none  Consultations:  none  Discharge Exam: Filed Vitals:   08/31/13 0610  BP: 132/90  Pulse: 79  Temp: 97.6 F (36.4 C)  Resp: 18    General: AAO x 3, no distress, obese male sitting up in bed Cardiovascular: RRR, no murmurs Respiratory: CTA b/l, no rhonchi or wheeze  Discharge Instructions You were cared for by a hospitalist during your hospital stay. If you have any questions about your discharge medications or the care you received while you were in the hospital after you are discharged, you can call the unit and asked to speak with the hospitalist on call if the hospitalist that took care of you is not available. Once you are discharged, your primary care physician will handle any further medical issues. Please note that NO REFILLS for any discharge medications will be authorized once you are discharged, as it is imperative that you return to your primary care physician (or establish a relationship with a primary care physician if you do not have one) for your aftercare needs so that they can reassess your need for medications and monitor your lab values.       Future Appointments Provider Department Dept  Phone   08/31/2013 10:00 AM Kaylyn Lim [facility] [phone]       Medication List         carisoprodol 350 MG tablet  Commonly known as:  SOMA  Take 1 tablet (350 mg total) by mouth 3 (three) times daily as needed for muscle spasms.     diazepam 2 MG tablet  Commonly known as:  VALIUM  Take 1 tablet (2 mg total) by mouth every 8 (eight) hours as needed for anxiety.      DULoxetine 60 MG capsule  Commonly known as:  CYMBALTA  Take 120 mg by mouth every morning.     eszopiclone 3 MG Tabs  Generic drug:  Eszopiclone  Take 3 mg by mouth at bedtime. Take immediately before bedtime     ezetimibe 10 MG tablet  Commonly known as:  ZETIA  Take 10 mg by mouth daily.     levETIRAcetam 1000 MG tablet  Commonly known as:  KEPPRA  Take 1,000 mg by mouth 2 (two) times daily.     levofloxacin 750 MG tablet  Commonly known as:  LEVAQUIN  Take 1 tablet (750 mg total) by mouth daily.     multivitamin with minerals Tabs tablet  Take 1 tablet by mouth every morning.     NUCYNTA 100 MG Tabs  Generic drug:  Tapentadol HCl  Take 1 tablet by mouth 3 (three) times daily.     omeprazole 20 MG capsule  Commonly known as:  PRILOSEC  Take 20 mg by mouth every morning.     oxymorphone 30 MG 12 hr tablet  Commonly known as:  OPANA ER  Take 30 mg by mouth 3 (three) times daily.     propranolol 20 MG tablet  Commonly known as:  INDERAL  Take 20 mg by mouth 2 (two) times daily.     temazepam 15 MG capsule  Commonly known as:  RESTORIL  Take 1 capsule (15 mg total) by mouth at bedtime.       Allergies  Allergen Reactions  . Ceftin [Cefuroxime Axetil] Hives  . Statins Other (See Comments)    Messes with liver enzymes      The results of significant diagnostics from this hospitalization (including imaging, microbiology, ancillary and laboratory) are listed below for reference.    Significant Diagnostic Studies: Dg Chest 2 View  08/30/2013   CLINICAL DATA:  Hypoxemia.  History of congestive heart failure.  EXAM: CHEST  2 VIEW  COMPARISON:  02/12/2013.  FINDINGS: There is stable cardiomegaly. There are lower lung volumes with increased central airway thickening and mild bibasilar atelectasis. No edema, confluent airspace opacity or significant pleural effusion is present.  IMPRESSION: Increased bibasilar atelectasis and central airway thickening suggesting  bronchitis. No evidence of edema or pneumonia.   Electronically Signed   By: Roxy Horseman M.D.   On: 08/30/2013 14:44   Ct Head Wo Contrast  08/29/2013   CLINICAL DATA:  Fall.  EXAM: CT HEAD WITHOUT CONTRAST  TECHNIQUE: Contiguous axial images were obtained from the base of the skull through the vertex without intravenous contrast.  COMPARISON:  CT head 02/10/2013  FINDINGS: Age-appropriate atrophy. Negative for hydrocephalus. Negative for acute or chronic infarct. Negative for hemorrhage or mass. Negative for skull fracture.  IMPRESSION: No acute abnormality.   Electronically Signed   By: Marlan Palau M.D.   On: 08/29/2013 20:31   Ct Angio Chest W/cm &/or Wo Cm  08/30/2013   CLINICAL DATA:  Hypoxia,  history of CHF  EXAM: CT ANGIOGRAPHY CHEST WITH CONTRAST  TECHNIQUE: Multidetector CT imaging of the chest was performed using the standard protocol during bolus administration of intravenous contrast. Multiplanar CT image reconstructions and MIPs were obtained to evaluate the vascular anatomy.  CONTRAST:  100mL OMNIPAQUE IOHEXOL 350 MG/ML SOLN  COMPARISON:  08/30/2013 chest x-rays  FINDINGS: The pulmonary arteries appear patent. No significant filling defect or pulmonary embolus demonstrated by CTA.  Minor atherosclerosis of the intact thoracic aorta. Negative for aneurysm or dissection. No mediastinal hemorrhage, hematoma, or adenopathy. Normal heart size. No pericardial or pleural effusion. Azygos fissure noted in the right upper lobe.  Included upper abdomen demonstrates no acute process.  Lung windows demonstrate right upper lobe azygos fissure. Patchy nodular left upper lobe and lingula airspace opacities, with some additional left upper lobe tree-in-bud peripheral nodularity. Findings compatible with left upper lobe pneumonia. Minor dependent bibasilar atelectasis noted. Posterior medial right upper lobe dense subpleural consolidation/atelectasis noted along the fissure. Trachea central airways appear  patent.  Minor degenerative changes of the lower thoracic spine.  Review of the MIP images confirms the above findings.  IMPRESSION: No significant acute pulmonary embolus demonstrated by CTA.  Patchy nodular left upper lobe/lingula airspace process with additional left upper lobe tree-in-bud opacities compatible with pneumonia.  Bibasilar atelectasis  Posterior medial right upper lobe small area of atelectasis/ consolidation as well.   Electronically Signed   By: Ruel Favorsrevor  Shick M.D.   On: 08/30/2013 20:07   Mr Brain Wo Contrast  08/21/2013   [facility]  NEUROIMAGING REPORT   STUDY DATE: 08/20/13 PATIENT NAME: Alex BlamerJohn T Venables DOB: 12-24-1950 MRN: [id number]  ORDERING CLINICIAN: York Spanielharles K Willis, MD  CLINICAL HISTORY: 63 year old male with seizures and gait difficulty.  EXAM: MRI brain (without)  TECHNIQUE: MRI of the brain without contrast was obtained utilizing 5 mm  axial slices with T1, T2, T2 flair, SWI and diffusion weighted views.  T1  sagittal and T2 coronal views were obtained. CONTRAST: no IMAGING SITE: [facility] [address] (1.5 Tesla MRI)    FINDINGS:  No abnormal lesions are seen on diffusion-weighted views to suggest acute  ischemia. The cortical sulci, fissures and cisterns are notable for  moderate right and severe left perisylvian atrophy. Lateral, third and  fourth ventricle are normal in size and appearance. No extra-axial fluid  collections are seen. No evidence of mass effect or midline shift. Mild  periventricular, subcortical, juxtacortical and pontine chronic small  vessel ischemic disease.   On sagittal views the posterior fossa, pituitary gland and corpus callosum  are unremarkable. No evidence of intracranial hemorrhage on SWI views. The  orbits and their contents, paranasal sinuses and calvarium are  non-specific fluid/inflammation in the left mastoid air cells and  hyperostosis frontalis.  Intracranial flow voids are present.    08/21/2013   Abnormal  MRI brain (without) demonstrating: 1. Moderate right and severe left perisylvian atrophy. 2. Mild periventricular, subcortical, juxtacortical and pontine chronic  small vessel ischemic disease.  3. No significant change from MRI on 09/06/12.   INTERPRETING PHYSICIAN:  Suanne MarkerVIKRAM R. PENUMALLI, MD Certified in Neurology, Neurophysiology and Neuroimaging  [facility] [address], [address] [place], [place] [address] [phone]    Microbiology: No results found for this or any previous visit (from the past 240 hour(s)).   Labs: Basic Metabolic Panel:  Recent Labs Lab 08/29/13 2030 08/30/13 1550 08/31/13 0456  NA 143 138 138  K 5.3 4.4  4.0  CL 104 101 100  CO2 30 27 28   GLUCOSE 124* 106* 121*  BUN 11 12 8   CREATININE 0.79 0.77 0.68  CALCIUM 9.1 8.7 8.8   Liver Function Tests: No results found for this basename: AST, ALT, ALKPHOS, BILITOT, PROT, ALBUMIN,  in the last 168 hours No results found for this basename: LIPASE, AMYLASE,  in the last 168 hours No results found for this basename: AMMONIA,  in the last 168 hours CBC:  Recent Labs Lab 08/29/13 2030 08/30/13 1550 08/31/13 0456  WBC 9.8 10.9* 7.3  NEUTROABS 6.3 7.5  --   HGB 14.0 14.6 13.4  HCT 42.8 43.9 39.5  MCV 85.3 86.1 83.7  PLT 156 161 162   Cardiac Enzymes:  Recent Labs Lab 08/29/13 2030 08/29/13 2240 08/30/13 1550  CKTOTAL 2702* 2658* 2972*   BNP: BNP (last 3 results)  Recent Labs  08/30/13 1553  PROBNP 200.3*   CBG: No results found for this basename: GLUCAP,  in the last 168 hours     Signed:  Calvert Cantor, MD  [facility] 08/31/2013, 9:19 AM

## 2013-08-31 NOTE — Progress Notes (Signed)
Pt 95% O2 saturation on RA while ambulating in hallway.  RN continuing to monitor.

## 2013-08-31 NOTE — Telephone Encounter (Signed)
Pt's wife calling to let Dr. Anne HahnWillis know that [facility] and had an appt this morning to come in and will call back to resch and Dr. Anne HahnWillis can contact her if any questions. FYI

## 2013-09-04 ENCOUNTER — Ambulatory Visit: Payer: Medicare Other | Admitting: Neurology

## 2013-09-07 ENCOUNTER — Other Ambulatory Visit: Payer: Medicare Other

## 2013-10-04 ENCOUNTER — Other Ambulatory Visit (INDEPENDENT_AMBULATORY_CARE_PROVIDER_SITE_OTHER): Payer: Medicare Other | Admitting: Radiology

## 2013-10-04 ENCOUNTER — Telehealth (INDEPENDENT_AMBULATORY_CARE_PROVIDER_SITE_OTHER): Payer: Medicare Other | Admitting: Radiology

## 2013-10-04 DIAGNOSIS — Z0289 Encounter for other administrative examinations: Secondary | ICD-10-CM

## 2013-10-04 DIAGNOSIS — G40219 Localization-related (focal) (partial) symptomatic epilepsy and epileptic syndromes with complex partial seizures, intractable, without status epilepticus: Secondary | ICD-10-CM

## 2013-10-04 DIAGNOSIS — R569 Unspecified convulsions: Secondary | ICD-10-CM

## 2013-10-04 NOTE — Telephone Encounter (Signed)
I called patient. The EEG study is normal. The patient had 2 events of left-sided jerking during the study, these events appear to be nonepileptic. No dosage adjustment of medications will be made. The patient had a heart rate of around 120, propranolol may need to be increased in the future.

## 2013-10-04 NOTE — Procedures (Signed)
    History:  Alex Mcintosh is a 63 year old gentleman with a history of multiple episodes of left-sided jerking. The patient is being evaluated for possible seizures. The episodes have not responded to seizure medications.  This is a routine EEG. No skull defects are noted. Medications include soma, Valium, Cymbalta, Zetia, Keppra, multivitamins, Prilosec, Opana ER, Inderal, Nucynta, and Restoril.   EEG classification: Normal awake  Description of the recording: The background rhythms of this recording consists of a fairly well modulated medium amplitude alpha rhythm of 8 Hz that is reactive to eye opening and closure. As the record progresses, the patient appears to remain in the waking state throughout the recording. Photic stimulation was performed, resulting in a bilateral and symmetric photic driving response. Hyperventilation was also performed, resulting in a minimal buildup of the background rhythm activities without significant slowing seen. At no time during the recording does there appear to be evidence of spike or spike wave discharges or evidence of focal slowing. The patient has 2 events of left-sided jerking during the recording. During these episodes, the EEG study is normal. EKG monitor shows no evidence of cardiac rhythm abnormalities with a heart rate of 120.  Impression: This is a normal EEG recording in the waking state. No evidence of ictal or interictal discharges are seen. Tachycardia is seen during the recording. The patient had 2 events of left-sided jerking during the study, the events appear to be nonepileptic.

## 2013-10-08 ENCOUNTER — Telehealth: Payer: Self-pay | Admitting: Neurology

## 2013-10-08 NOTE — Telephone Encounter (Signed)
Called patient for more information, could not leave a VM message

## 2013-10-15 ENCOUNTER — Telehealth: Payer: Self-pay | Admitting: Neurology

## 2013-10-15 NOTE — Telephone Encounter (Signed)
I called the patient. The patient is having ongoing headaches, better in the morning and worse as the day goes on. The patient is already getting opiate medications to a pain Center. The prior EEG study showed nonepileptic events. The patient wants something for his headaches over and above the opiate medications he is already receiving. We will get a revisit sometime in the next several weeks to reevaluate the headache issue. The patient is already on Cymbalta as well.

## 2013-10-15 NOTE — Telephone Encounter (Signed)
Spoke with patient and he is having head pain(migraines) that will not stop, said that his pcp(Lisa Hyacinth Meeker) refuses to see him anymore(his case is too complicated),just left him hanging

## 2013-10-26 ENCOUNTER — Ambulatory Visit (INDEPENDENT_AMBULATORY_CARE_PROVIDER_SITE_OTHER): Payer: Medicare Other | Admitting: Neurology

## 2013-10-26 ENCOUNTER — Encounter: Payer: Self-pay | Admitting: Neurology

## 2013-10-26 VITALS — BP 136/85 | HR 110 | Wt 268.0 lb

## 2013-10-26 DIAGNOSIS — R51 Headache: Secondary | ICD-10-CM

## 2013-10-26 DIAGNOSIS — R269 Unspecified abnormalities of gait and mobility: Secondary | ICD-10-CM

## 2013-10-26 DIAGNOSIS — G4486 Cervicogenic headache: Secondary | ICD-10-CM

## 2013-10-26 HISTORY — DX: Cervicogenic headache: G44.86

## 2013-10-26 MED ORDER — PROPRANOLOL HCL 20 MG PO TABS: 20.0000 mg | ORAL_TABLET | Freq: Two times a day (BID) | ORAL | Status: DC

## 2013-10-26 MED ORDER — DULOXETINE HCL 60 MG PO CPEP: 120.0000 mg | ORAL_CAPSULE | Freq: Every morning | ORAL | Status: DC

## 2013-10-26 NOTE — Progress Notes (Signed)
Reason for visit: Headache  Alex Mcintosh is an 63 y.o. male  History of present illness:  Alex Mcintosh is a 63 year old right-handed white male with a history of a chronic pain syndrome, currently followed through a pain center on chronic opiate medications. He was originally referred to this office for seizure-type events. The patient had been unresponsive to increases in his antiepileptic medication, and an EEG study was eventually done that reported 2 clinical events. 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 The patient continues to have occasional episodes of jerking. He also has a history of a gait disorder, and he has a walker for ambulation, but he still falls frequently, commonly when he is not using his walker. He has chronic neck discomfort, with a prior history of cervical spine surgery. He indicates that he has headaches coming up from the back of the neck, and around the head. The headaches are daily in nature, better in the morning and worse as the day goes on. He indicates that the headaches have become severe over the last 8 years. Several weeks ago, he ran out of his Cymbalta and his propranolol. His headache pains and peripheral neuropathy discomfort may have worsened some when this was discontinued. He returns to this office for an evaluation. The patient indicates that he does not engage in regular stretching exercises.  Past Medical History  Diagnosis Date  . Hyperlipemia   . Chronic back pain   . GERD (gastroesophageal reflux disease)   . Anxiety   . Chronic narcotic dependence   . Hypothyroidism   . Depression   . Myoclonus   . UTI (lower urinary tract infection) 5/14    [place]  [place]. Acute encephalopathy 09/06/12    [facility]  . Foot drop, bilateral   . Shortness of breath     with exertion  . CHF (congestive heart failure)     diurects stopped in April  . DVT (deep venous thrombosis)     01/2012  . Seizures    Myoclonus  . Neuropathy     legs  . Arthritis   . Memory loss of unknown cause   . AVN (avascular necrosis of bone)     right  . Obese   . History of cerebrovascular disease   . Abnormality of gait 08/03/2013  . Cervicogenic headache 10/26/2013    Past Surgical History  Procedure Laterality Date  . Back surgery      4 lumbar  . Hip surgery Left   . Cervical fusion    . Knee arthroscopy Right   . Joint replacement    . Pain pump implantation  Reports no longer has pump  . Tooth extraction N/A 11/16/2012    Procedure: DENTAL EXTRACTIONS OF MULTIPLE NONRESTORABLE TEETH;  Surgeon: Hinton DyerJoseph L Miller, DDS;  Location: [facility] OR;  Service: Oral Surgery;  Laterality: N/A;  #12, 14, 15, 22, 23, 24, 25, 26, 27, 28    Family History  Problem Relation Age of Onset  . CAD Father   . Heart disease Father   . Heart failure Mother   . Breast cancer Sister   . CAD Brother     Social history:  reports that he has never smoked. He has never used smokeless tobacco. He reports that he drinks alcohol. He reports that he does not use illicit drugs.    Allergies  Allergen Reactions  . Ceftin [Cefuroxime Axetil] Hives  .  Cefuroxime Axetil     Other reaction(s): RASH  . Iodinated Diagnostic Agents   . Latex   . Shellfish-Derived Products   . Statins Other (See Comments)    Messes with liver enzymes    Medications:  Current Outpatient Prescriptions on File Prior to Visit  Medication Sig Dispense Refill  . carisoprodol (SOMA) 350 MG tablet Take 1 tablet (350 mg total) by mouth 3 (three) times daily as needed for muscle spasms.  30 tablet  0  . diazepam (VALIUM) 2 MG tablet Take 1 tablet (2 mg total) by mouth every 8 (eight) hours as needed for anxiety.  30 tablet  0  . Eszopiclone (ESZOPICLONE) 3 MG TABS Take 3 mg by mouth at bedtime. Take immediately before bedtime      . ezetimibe (ZETIA) 10 MG tablet Take 10 mg by mouth daily.      Marland Kitchen. levETIRAcetam (KEPPRA) 1000 MG tablet Take 1,000 mg by mouth  2 (two) times daily.      . Multiple Vitamin (MULTIVITAMIN WITH MINERALS) TABS tablet Take 1 tablet by mouth every morning.       Marland Kitchen. omeprazole (PRILOSEC) 20 MG capsule Take 20 mg by mouth every morning.       Marland Kitchen. oxymorphone (OPANA ER) 30 MG 12 hr tablet Take 30 mg by mouth 3 (three) times daily.      . Tapentadol HCl (NUCYNTA) 100 MG TABS Take 1 tablet by mouth 3 (three) times daily.      . temazepam (RESTORIL) 15 MG capsule Take 1 capsule (15 mg total) by mouth at bedtime.  10 capsule  0   No current facility-administered medications on file prior to visit.    ROS:  Out of a complete 14 system review of symptoms, the patient complains only of the following symptoms, and all other reviewed systems are negative.  Leg swelling Joint pain, back pain, achy muscles, muscle cramps, walking difficulties, neck pain, neck stiffness Memory loss, dizziness, headache, seizures Confusion, depression  Blood pressure 136/85, pulse 110, weight 268 lb (121.564 kg).  Physical Exam  General: The patient is alert and cooperative at the time of the examination. The patient is markedly obese.  Skin: 1-2+ edema below the knees is noted bilaterally.   Neurologic Exam  Mental status: The Mini-Mental status examination done today shows a total score 26/30.  Cranial nerves: Facial symmetry is present. Speech is normal, no aphasia or dysarthria is noted. Extraocular movements are full. Visual fields are full.  Motor: The patient has good strength in all 4 extremities.  Sensory examination: Soft touch sensation is decreased on the left arm and leg as compared to the right, symmetric on the face.  Coordination: The patient has good finger-nose-finger and heel-to-shin bilaterally.  Gait and station: The patient has a slightly wide-based, unsteady gait. The patient uses a walker for ambulation. Tandem gait was not attempted. Romberg is negative. No drift is seen.  Reflexes: Deep tendon reflexes are  symmetric, but are depressed.   Assessment/Plan:  1. Cervicogenic headache  2. 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 Anxiety disorder  4. Diabetic peripheral neuropathy  5. Gait disorder  6. Chronic pain syndrome, cervical spondylosis  The patient is followed through a pain center for his chronic pain. The patient has headaches that are related to his cervical spondylosis. The patient will be placed back on Cymbalta and propranolol. He will followup through this office in about 6 months. In the future, we may use  Lyrica added to his regimen. He indicates that gabapentin in the past was not helpful. Ongoing adjustment of his antiepileptic medications is not indicated at this time.  Marlan Palau MD 10/26/2013 12:52 PM  [facility] [address] [place], [place] [address]  Phone [phone] Fax [phone]

## 2013-10-26 NOTE — Patient Instructions (Signed)

## 2014-01-11 ENCOUNTER — Telehealth: Payer: Self-pay | Admitting: Neurology

## 2014-01-11 NOTE — Telephone Encounter (Signed)
Patient calling to state that he has a new doctor, Dr. Hoyle Barr, that would like to discuss with Dr. Anne Hahn signing off for patient to be in hospice care. Please call and advise.

## 2014-01-11 NOTE — Telephone Encounter (Signed)
I called patient. The patient does not have a terminal illness, not sure he would qualify for hospice involvement in hospice care. I'll be happy to talk with the patient if there are further questions. The patient apparently was in hospice in March of 2015, but I'm not sure how he qualifies to remain with hospice.

## 2014-01-11 NOTE — Telephone Encounter (Signed)
Patient calling to state that he has a new doctor, Dr. Eamieka Howell, that would like to discuss with Dr. Willis signing off for patient to be in hospice care. Please call and advise.  

## 2014-01-29 IMAGING — CR DG CHEST 1V PORT
1 series · 1 of 1 positions shown · non-contrast
Comparison: Chest radiograph from [DATE]

CLINICAL DATA: Cough.

PORTABLE CHEST - 1 VIEW

[AP]
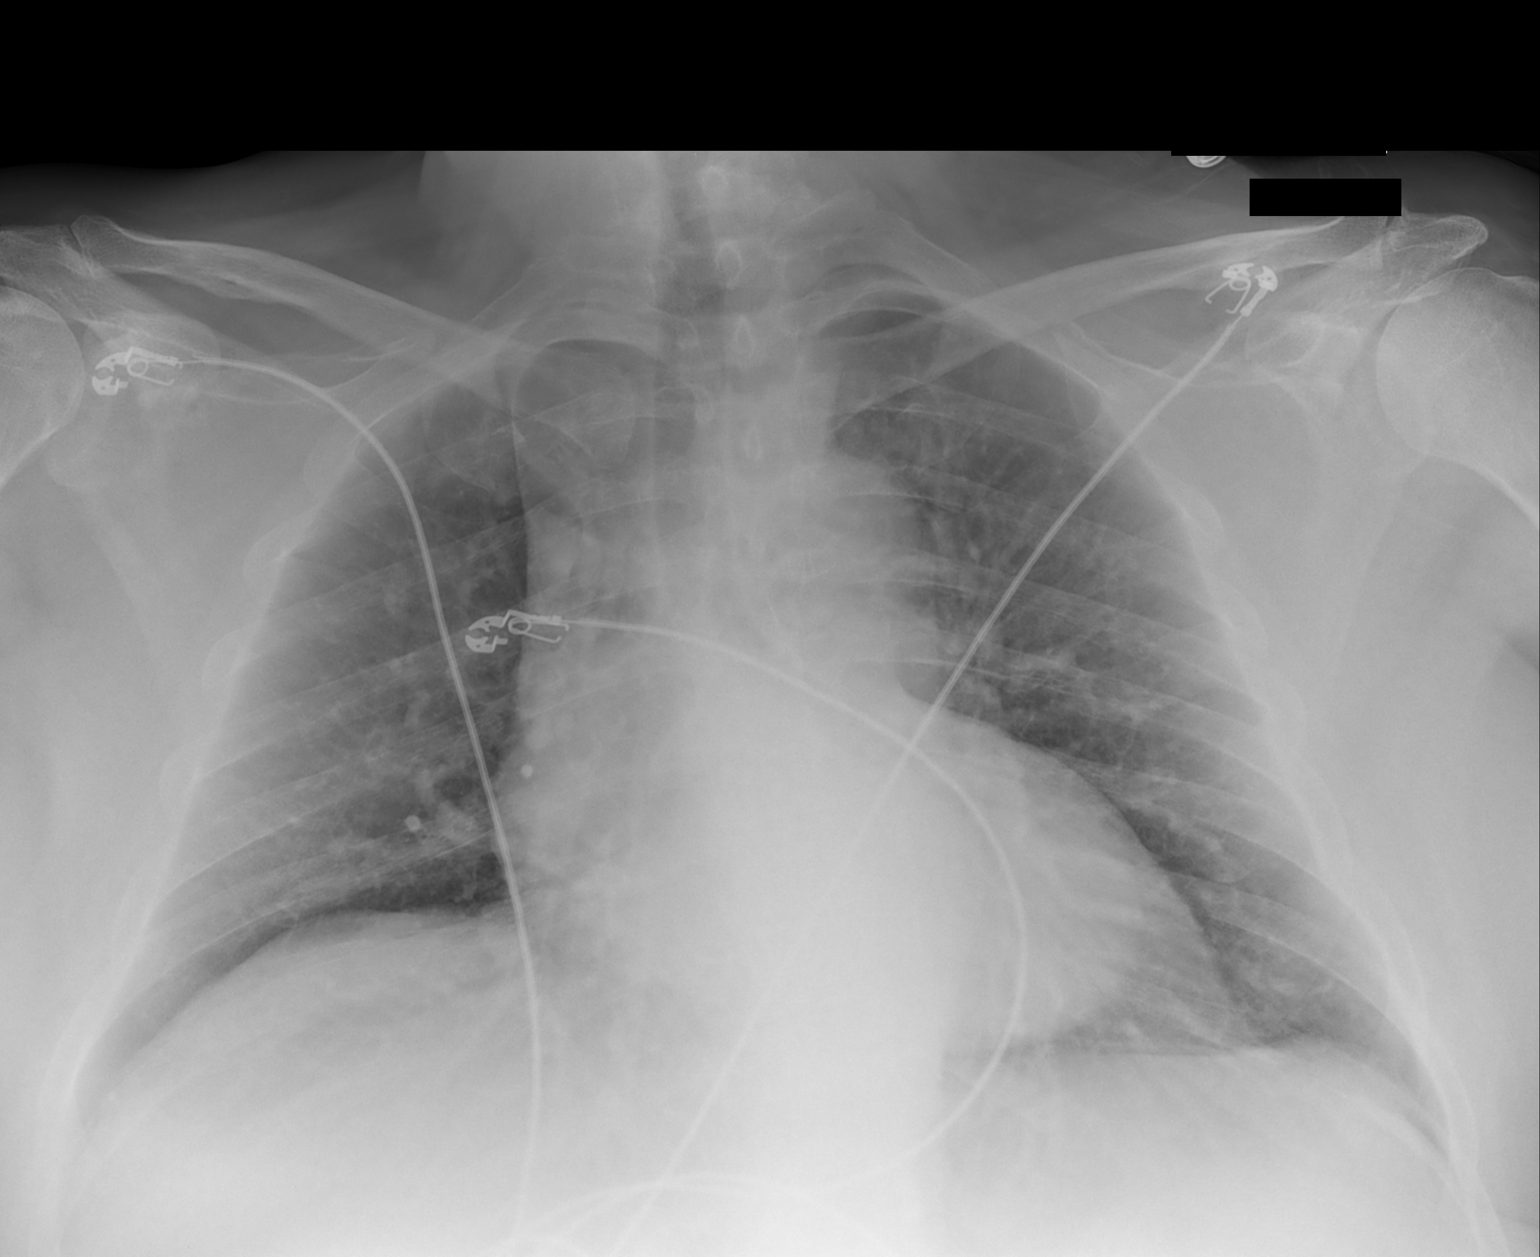

[1 of 1 positions shown; findings below may reference images not displayed]

FINDINGS: The lungs are relatively well-aerated.  Vascular
congestion is noted.  There is no evidence of focal opacification,
pleural effusion or pneumothorax.

The cardiomediastinal silhouette is borderline normal in size.  No
acute osseous abnormalities are seen.
IMPRESSION: Vascular congestion noted; lungs remain grossly clear.

## 2014-01-30 IMAGING — CR DG CHEST 2V
2 series · 2 of 2 positions shown · non-contrast
Comparison: 02/12/2013

CLINICAL DATA: Congestive heart failure, chest pain and pressure

EXAM:
CHEST  2 VIEW

[w chest lat]
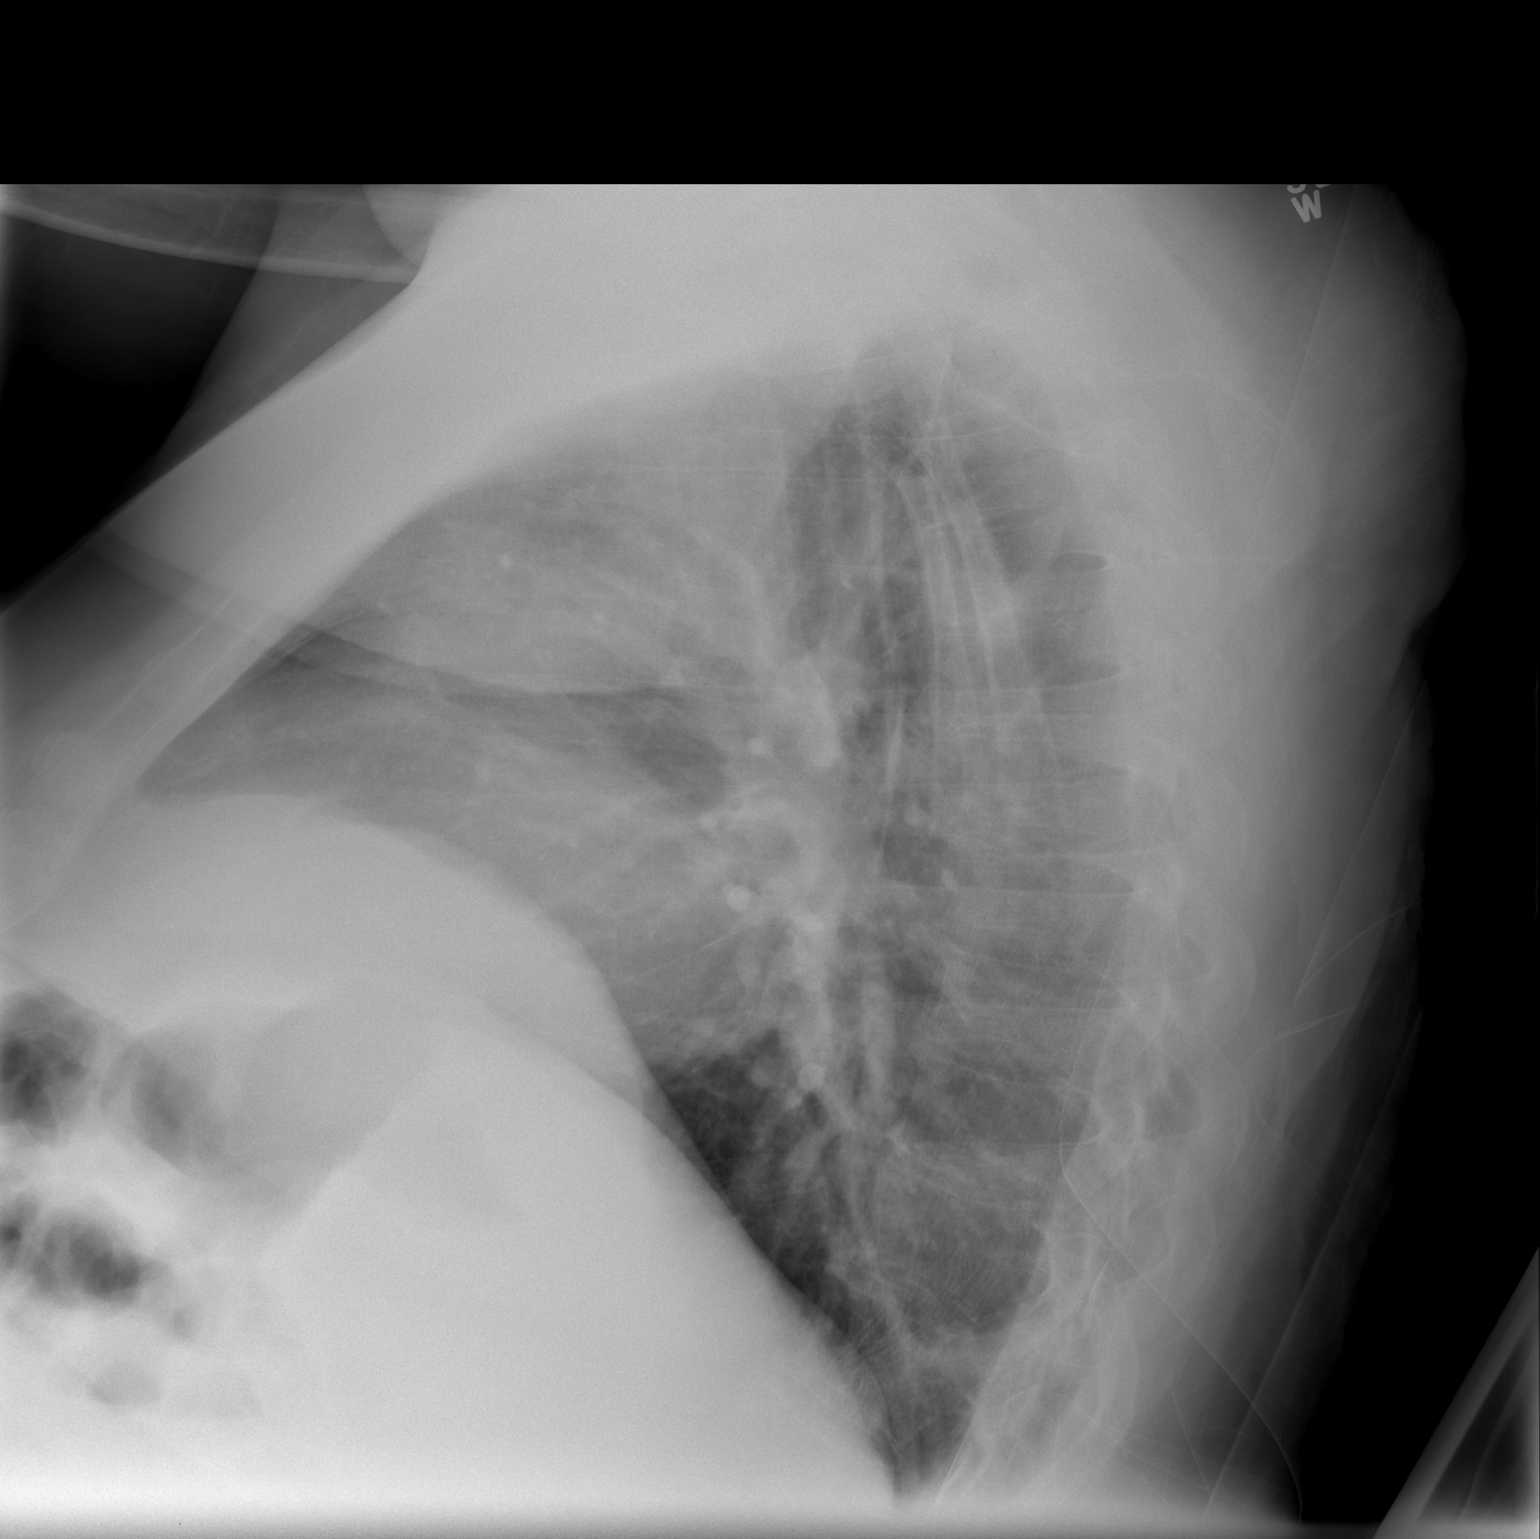

[view not recorded]
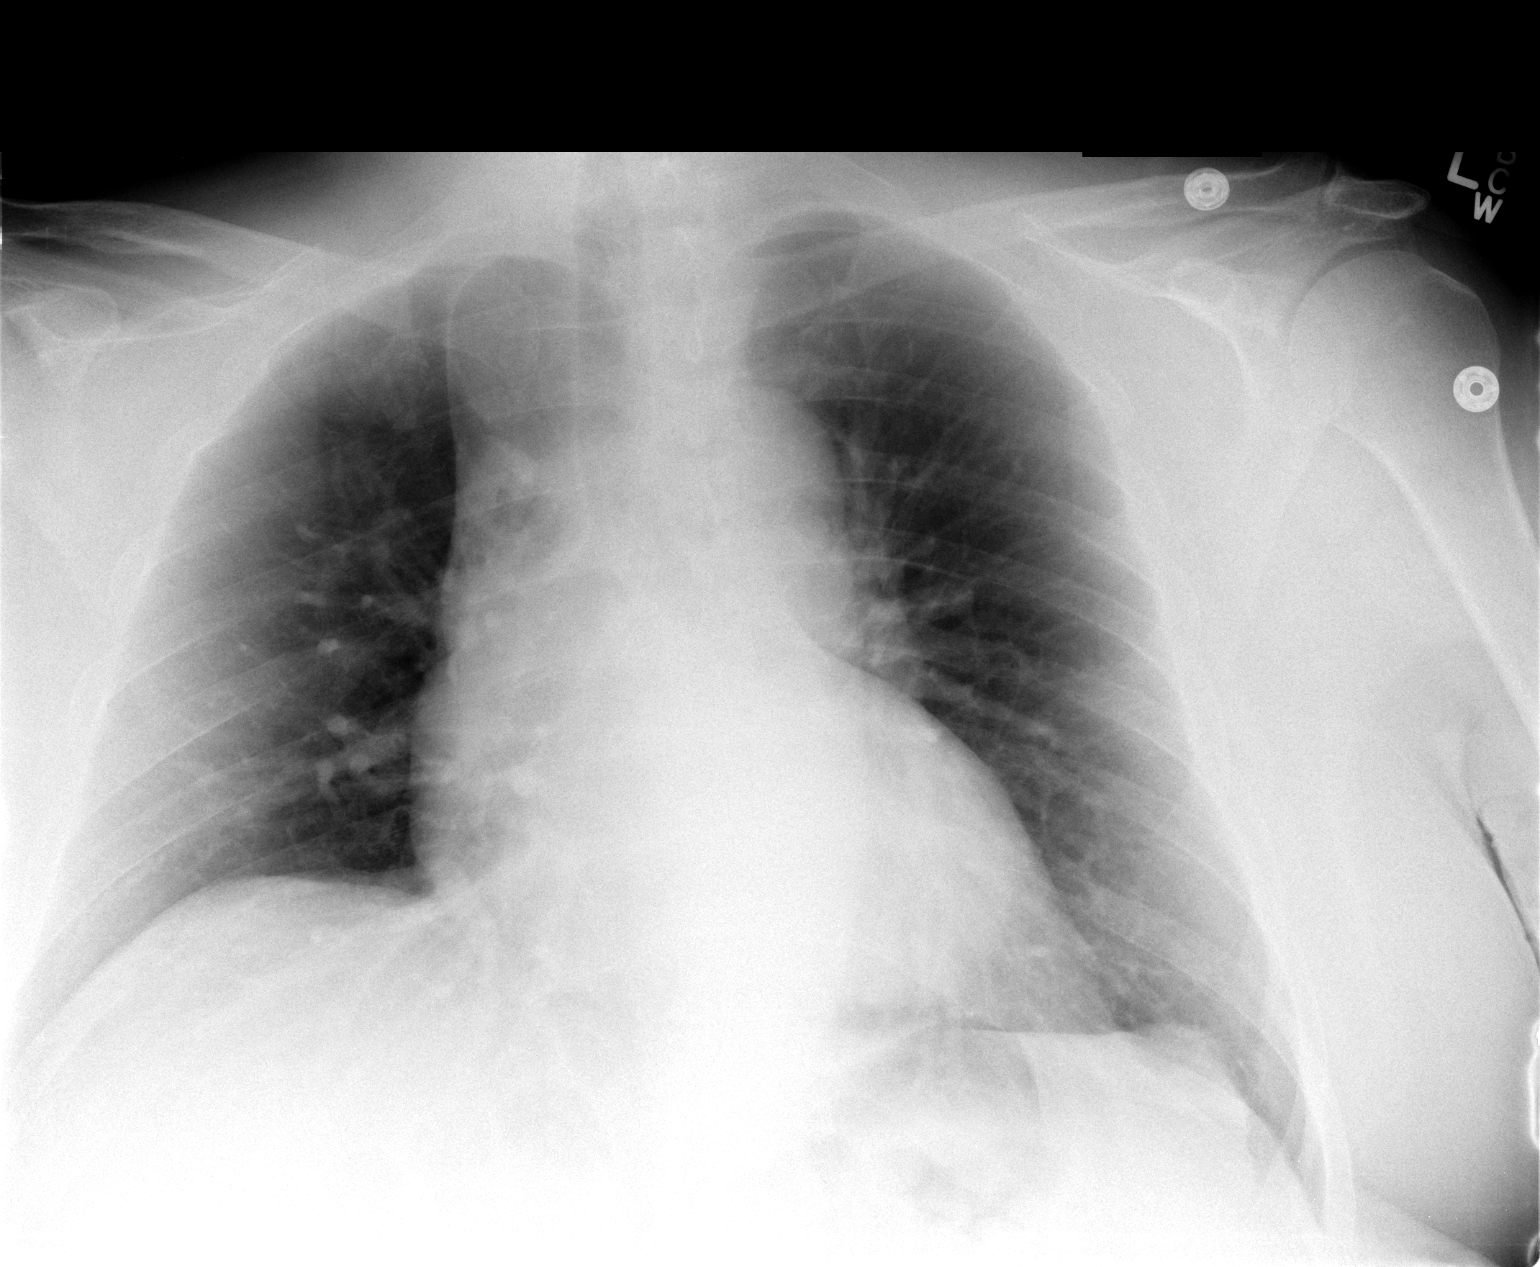

[2 of 2 positions shown; findings below may reference images not displayed]

FINDINGS: Mild cardiac enlargement stable. Vascular pattern normal. Lungs
clear. No pleural effusions.
IMPRESSION: No active cardiopulmonary disease.

## 2014-02-09 ENCOUNTER — Other Ambulatory Visit: Payer: Self-pay | Admitting: Neurology

## 2014-02-10 NOTE — Telephone Encounter (Signed)
Per note 03.06.15

## 2014-03-23 ENCOUNTER — Other Ambulatory Visit: Payer: Self-pay | Admitting: Neurology

## 2014-03-24 ENCOUNTER — Emergency Department (HOSPITAL_COMMUNITY): Payer: Medicare Other

## 2014-03-24 ENCOUNTER — Emergency Department (HOSPITAL_COMMUNITY)
Admission: EM | Admit: 2014-03-24 | Discharge: 2014-03-25 | Disposition: A | Discharge status: Home / Self Care | Payer: Medicare Other | Attending: Emergency Medicine | Admitting: Emergency Medicine

## 2014-03-24 ENCOUNTER — Encounter (HOSPITAL_COMMUNITY): Payer: Self-pay | Admitting: *Deleted

## 2014-03-24 DIAGNOSIS — G40309 Generalized idiopathic epilepsy and epileptic syndromes, not intractable, without status epilepticus: Secondary | ICD-10-CM | POA: Diagnosis not present

## 2014-03-24 DIAGNOSIS — Y9301 Activity, walking, marching and hiking: Secondary | ICD-10-CM | POA: Insufficient documentation

## 2014-03-24 DIAGNOSIS — F329 Major depressive disorder, single episode, unspecified: Secondary | ICD-10-CM | POA: Diagnosis not present

## 2014-03-24 DIAGNOSIS — Z79891 Long term (current) use of opiate analgesic: Secondary | ICD-10-CM | POA: Insufficient documentation

## 2014-03-24 DIAGNOSIS — G9009 Other idiopathic peripheral autonomic neuropathy: Secondary | ICD-10-CM | POA: Diagnosis not present

## 2014-03-24 DIAGNOSIS — F419 Anxiety disorder, unspecified: Secondary | ICD-10-CM | POA: Diagnosis not present

## 2014-03-24 DIAGNOSIS — R110 Nausea: Secondary | ICD-10-CM | POA: Insufficient documentation

## 2014-03-24 DIAGNOSIS — E785 Hyperlipidemia, unspecified: Secondary | ICD-10-CM | POA: Diagnosis not present

## 2014-03-24 DIAGNOSIS — Z79899 Other long term (current) drug therapy: Secondary | ICD-10-CM | POA: Diagnosis not present

## 2014-03-24 DIAGNOSIS — I509 Heart failure, unspecified: Secondary | ICD-10-CM | POA: Diagnosis not present

## 2014-03-24 DIAGNOSIS — E669 Obesity, unspecified: Secondary | ICD-10-CM | POA: Insufficient documentation

## 2014-03-24 DIAGNOSIS — K219 Gastro-esophageal reflux disease without esophagitis: Secondary | ICD-10-CM | POA: Insufficient documentation

## 2014-03-24 DIAGNOSIS — Z8673 Personal history of transient ischemic attack (TIA), and cerebral infarction without residual deficits: Secondary | ICD-10-CM | POA: Diagnosis not present

## 2014-03-24 DIAGNOSIS — R55 Syncope and collapse: Secondary | ICD-10-CM | POA: Diagnosis not present

## 2014-03-24 DIAGNOSIS — S199XXA Unspecified injury of neck, initial encounter: Secondary | ICD-10-CM | POA: Insufficient documentation

## 2014-03-24 DIAGNOSIS — S3992XA Unspecified injury of lower back, initial encounter: Secondary | ICD-10-CM | POA: Diagnosis not present

## 2014-03-24 DIAGNOSIS — M1990 Unspecified osteoarthritis, unspecified site: Secondary | ICD-10-CM | POA: Diagnosis not present

## 2014-03-24 DIAGNOSIS — Y9289 Other specified places as the place of occurrence of the external cause: Secondary | ICD-10-CM | POA: Diagnosis not present

## 2014-03-24 DIAGNOSIS — Y998 Other external cause status: Secondary | ICD-10-CM | POA: Diagnosis not present

## 2014-03-24 DIAGNOSIS — W1830XA Fall on same level, unspecified, initial encounter: Secondary | ICD-10-CM | POA: Insufficient documentation

## 2014-03-24 DIAGNOSIS — Z86718 Personal history of other venous thrombosis and embolism: Secondary | ICD-10-CM | POA: Insufficient documentation

## 2014-03-24 DIAGNOSIS — R2689 Other abnormalities of gait and mobility: Secondary | ICD-10-CM | POA: Diagnosis not present

## 2014-03-24 DIAGNOSIS — Z91040 Latex allergy status: Secondary | ICD-10-CM | POA: Insufficient documentation

## 2014-03-24 DIAGNOSIS — Z8679 Personal history of other diseases of the circulatory system: Secondary | ICD-10-CM | POA: Insufficient documentation

## 2014-03-24 DIAGNOSIS — W19XXXA Unspecified fall, initial encounter: Secondary | ICD-10-CM

## 2014-03-24 DIAGNOSIS — Z87440 Personal history of urinary (tract) infections: Secondary | ICD-10-CM | POA: Insufficient documentation

## 2014-03-24 LAB — CBC WITH DIFFERENTIAL/PLATELET
BASOS ABS: 0 10*3/uL (ref 0.0–0.1)
BASOS PCT: 0 % (ref 0–1)
EOS ABS: 0 10*3/uL (ref 0.0–0.7)
EOS PCT: 0 % (ref 0–5)
HCT: 47.6 % (ref 39.0–52.0)
Hemoglobin: 16.6 g/dL (ref 13.0–17.0)
Lymphocytes Relative: 21 % (ref 12–46)
Lymphs Abs: 2.3 10*3/uL (ref 0.7–4.0)
MCH: 30.3 pg (ref 26.0–34.0)
MCHC: 34.9 g/dL (ref 30.0–36.0)
MCV: 86.9 fL (ref 78.0–100.0)
MONOS PCT: 8 % (ref 3–12)
Monocytes Absolute: 0.9 10*3/uL (ref 0.1–1.0)
NEUTROS ABS: 7.9 10*3/uL — AB (ref 1.7–7.7)
Neutrophils Relative %: 71 % (ref 43–77)
Platelets: 181 10*3/uL (ref 150–400)
RBC: 5.48 MIL/uL (ref 4.22–5.81)
RDW: 12.9 % (ref 11.5–15.5)
WBC: 11.1 10*3/uL — ABNORMAL HIGH (ref 4.0–10.5)

## 2014-03-24 LAB — BASIC METABOLIC PANEL
ANION GAP: 16 — AB (ref 5–15)
BUN: 5 mg/dL — AB (ref 6–23)
CALCIUM: 9.6 mg/dL (ref 8.4–10.5)
CHLORIDE: 101 meq/L (ref 96–112)
CO2: 24 mEq/L (ref 19–32)
CREATININE: 0.71 mg/dL (ref 0.50–1.35)
GFR calc Af Amer: >90 mL/min (ref >90)
GFR calc non Af Amer: >90 mL/min (ref >90)
Glucose, Bld: 113 mg/dL — ABNORMAL HIGH (ref 70–99)
Potassium: 4.1 mEq/L (ref 3.7–5.3)
Sodium: 141 mEq/L (ref 137–147)

## 2014-03-24 MED ORDER — HYDROMORPHONE HCL 1 MG/ML IJ SOLN
2.0000 mg | Freq: Once | INTRAMUSCULAR | Status: AC
  Administered 2014-03-24: 2 mg via INTRAVENOUS
  Filled 2014-03-24: qty 2

## 2014-03-24 NOTE — ED Provider Notes (Addendum)
CSN: [id number]     Arrival date & time 03/24/14  2025 History   First MD Initiated Contact with Patient 03/24/14 2235     Chief Complaint  Patient presents with  . Fall  . Loss of Consciousness     (Consider location/radiation/quality/duration/timing/severity/associated sxs/prior Treatment) HPI Patient suffers from chronic back painand chronic neck pain for the past 34 years. He is followed in a pain clinicHe ran out of his Opana 3 days ago. Today he was walking in his home when he became nauseated and lightheaded and . Has worsening pain and neck and back since the event. He denies chest pain denies abdominal pain denies headache. No other associated symptoms. He feels that he may be slightly withdrawing from opioids and feels that he had syncopal event as a result of severe pain.pain is worse with movement improved with remaining still.brought by EMS treated by EMS with long board hard collar and CID Past Medical History  Diagnosis Date  . Hyperlipemia   . Chronic back pain   . GERD (gastroesophageal reflux disease)   . Anxiety   . Chronic narcotic dependence   . Hypothyroidism   . Depression   . Myoclonus   . UTI (lower urinary tract infection) 5/14    was confused and was found in [place].  Marland Kitchen Acute encephalopathy 09/06/12    was admitted to [facility]  . Foot drop, bilateral   . Shortness of breath     with exertion  . CHF (congestive heart failure)     diurects stopped in April  . DVT (deep venous thrombosis)     01/2012  . Seizures     Myoclonus  . Neuropathy     legs  . Arthritis   . Memory loss of unknown cause   . AVN (avascular necrosis of bone)     right  . Obese   . History of cerebrovascular disease   . Abnormality of gait 08/03/2013  . Cervicogenic headache 10/26/2013   Past Surgical History  Procedure Laterality Date  . Back surgery      4 lumbar  . Hip surgery Left   . Cervical fusion    . Knee arthroscopy Right   . Joint replacement    . Pain pump  implantation  Reports no longer has pump  . Tooth extraction N/A 11/16/2012    Procedure: DENTAL EXTRACTIONS OF MULTIPLE NONRESTORABLE TEETH;  Surgeon: Hinton Dyer, DDS;  Location: [facility] OR;  Service: Oral Surgery;  Laterality: N/A;  #12, 14, 15, 22, 23, 24, 25, 26, 27, 28   Family History  Problem Relation Age of Onset  . CAD Father   . Heart disease Father   . Heart failure Mother   . Breast cancer Sister   . CAD Brother    History  Substance Use Topics  . Smoking status: Never Smoker   . Smokeless tobacco: Never Used  . Alcohol Use: Yes     Comment: OCCASSIONAL    Review of Systems  Musculoskeletal: Positive for back pain, gait problem and neck pain.       Walks with walker  All other systems reviewed and are negative.     Allergies  Ceftin; Cefuroxime axetil; Iodinated diagnostic agents; Latex; Shellfish-derived products; and Statins  Home Medications   Prior to Admission medications   Medication Sig Start Date End Date Taking? Authorizing Provider  carisoprodol (SOMA) 350 MG tablet Take 1 tablet (350 mg total) by mouth 3 (three) times daily as needed  for muscle spasms. 02/15/13   Costin Otelia Sergeant, MD  diazepam (VALIUM) 2 MG tablet Take 1 tablet (2 mg total) by mouth every 8 (eight) hours as needed for anxiety. 02/15/13   Costin Otelia Sergeant, MD  DULoxetine (CYMBALTA) 60 MG capsule TAKE 2 CAPSULES BY MOUTH IN THE MORNING 03/24/14   York Spaniel, MD  Eszopiclone (ESZOPICLONE) 3 MG TABS Take 3 mg by mouth at bedtime. Take immediately before bedtime    Historical Provider, MD  ezetimibe (ZETIA) 10 MG tablet Take 10 mg by mouth daily.    Historical Provider, MD  levETIRAcetam (KEPPRA) 1000 MG tablet TAKE 1 TABLET (1,000 MG TOTAL) BY MOUTH 2 (TWO) TIMES DAILY. 02/10/14   York Spaniel, MD  Multiple Vitamin (MULTIVITAMIN WITH MINERALS) TABS tablet Take 1 tablet by mouth every morning.     Historical Provider, MD  omeprazole (PRILOSEC) 20 MG capsule Take 20 mg by mouth every  morning.     Historical Provider, MD  oxymorphone (OPANA ER) 30 MG 12 hr tablet Take 30 mg by mouth 3 (three) times daily.    Historical Provider, MD  propranolol (INDERAL) 20 MG tablet TAKE 1 TABLET BY MOUTH TWICE A DAY 03/24/14   York Spaniel, MD  Tapentadol HCl (NUCYNTA) 100 MG TABS Take 1 tablet by mouth 3 (three) times daily.    Historical Provider, MD  temazepam (RESTORIL) 15 MG capsule Take 1 capsule (15 mg total) by mouth at bedtime. 02/15/13   Costin Otelia Sergeant, MD   BP 160/98 mmHg  Pulse 71  Temp(Src) 97.5 F (36.4 C) (Oral)  Resp 13  Ht 5\' 8"  (1.727 m)  Wt 250 lb (113.399 kg)  BMI 38.02 kg/m2  SpO2 100% Physical Exam  Constitutional: He is oriented to person, place, and time. He appears well-developed and well-nourished.  HENT:  Head: Normocephalic and atraumatic.  Eyes: Conjunctivae are normal. Pupils are equal, round, and reactive to light.  Neck: Neck supple. No tracheal deviation present. No thyromegaly present.  Cardiovascular: Normal rate and regular rhythm.   No murmur heard. Pulmonary/Chest: Effort normal and breath sounds normal.  Abdominal: Soft. Bowel sounds are normal. He exhibits no distension. There is no tenderness.  Musculoskeletal: Normal range of motion. He exhibits no edema or tenderness.  Cervical spine thoracic spine and lumbar spine diffusely tender. No deformity. Pelvis stable nontender all 4 extremities without contusion abrasion or tenderness neurovascularly intact  Neurological: He is alert and oriented to person, place, and time. No cranial nerve deficit. Coordination normal.  Moves all extremities  Skin: Skin is warm and dry. No rash noted.  Psychiatric: He has a normal mood and affect.  Nursing note and vitals reviewed.   ED Course  Procedures (including critical care time) Labs Review Labs Reviewed  BASIC METABOLIC PANEL - Abnormal; Notable for the following:    Glucose, Bld 113 (*)    BUN 5 (*)    Anion gap 16 (*)    All other  components within normal limits  CBC WITH DIFFERENTIAL - Abnormal; Notable for the following:    WBC 11.1 (*)    Neutro Abs 7.9 (*)    All other components within normal limits  URINALYSIS, ROUTINE W REFLEX MICROSCOPIC    Imaging Review No results found.   EKG Interpretation   Date/Time:  Sunday March 24 2014 20:30:53 EST Ventricular Rate:  66 PR Interval:  152 QRS Duration: 96 QT Interval:  428 QTC Calculation: 448 R Axis:   16 Text  Interpretation:  Sinus rhythm No significant change since last  tracing Confirmed by Teven Mittman  MD, Jovann Luse [phone]([id number]) on 03/24/2014 10:08:37 PM     12 midnight patient feels much improved after treatment with intravenous hydromorphone. He is alert able amulet without difficulty with a walker. Feels ready to go home xrays viewed by me. Results for orders placed or performed during the hospital encounter of 03/24/14  Basic metabolic panel  Result Value Ref Range   Sodium 141 137 - 147 mEq/L   Potassium 4.1 3.7 - 5.3 mEq/L   Chloride 101 96 - 112 mEq/L   CO2 24 19 - 32 mEq/L   Glucose, Bld 113 (H) 70 - 99 mg/dL   BUN 5 (L) 6 - 23 mg/dL   Creatinine, Ser 6.040.71 0.50 - 1.35 mg/dL   Calcium 9.6 8.4 - 54.010.5 mg/dL   GFR calc non Af Amer >90 >90 mL/min   GFR calc Af Amer >90 >90 mL/min   Anion gap 16 (H) 5 - 15  CBC WITH DIFFERENTIAL  Result Value Ref Range   WBC 11.1 (H) 4.0 - 10.5 K/uL   RBC 5.48 4.22 - 5.81 MIL/uL   Hemoglobin 16.6 13.0 - 17.0 g/dL   HCT 98.147.6 19.139.0 - 47.852.0 %   MCV 86.9 78.0 - 100.0 fL   MCH 30.3 26.0 - 34.0 pg   MCHC 34.9 30.0 - 36.0 g/dL   RDW 29.512.9 62.111.5 - 30.815.5 %   Platelets 181 150 - 400 K/uL   Neutrophils Relative % 71 43 - 77 %   Neutro Abs 7.9 (H) 1.7 - 7.7 K/uL   Lymphocytes Relative 21 12 - 46 %   Lymphs Abs 2.3 0.7 - 4.0 K/uL   Monocytes Relative 8 3 - 12 %   Monocytes Absolute 0.9 0.1 - 1.0 K/uL   Eosinophils Relative 0 0 - 5 %   Eosinophils Absolute 0.0 0.0 - 0.7 K/uL   Basophils Relative 0 0 - 1 %   Basophils  Absolute 0.0 0.0 - 0.1 K/uL   Dg Chest 1 View  03/24/2014   CLINICAL DATA:  Fall from standing at home, prior syncopal episodes. RIGHT leg injury.  EXAM: CHEST - 1 VIEW  COMPARISON:  Chest radiograph August 30, 2013  FINDINGS: Cardiomediastinal silhouette is unremarkable for this low inspiratory portable examination with crowded vasculature markings. The lungs are clear without pleural effusions or focal consolidations. Mildly elevated RIGHT hemidiaphragm. Pulmonary vascular congestion. Azygos fissure. Trachea projects midline and there is no pneumothorax. Included soft tissue planes and osseous structures are non-suspicious.  IMPRESSION: Pulmonary vascular congestion which may be over accentuated by low inspiratory portable examination. No focal consolidation.   Electronically Signed   By: Awilda Metroourtnay  Bloomer   On: 03/24/2014 23:05   Dg Cervical Spine Complete  03/24/2014   CLINICAL DATA:  Fall from standing at home, prior syncopal episodes. RIGHT leg injury.  EXAM: CERVICAL SPINE  4+ VIEWS  COMPARISON:  None.  FINDINGS: Habitus limited examination. The cervical vertebral bodies appear intact and aligned to the superior endplate of C6, the most caudal well visualized level. Moderate to severe C4-5 and C5-6 degenerative discs. Lateral masses and alignment. No destructive bony lesions. Uncovertebral hypertrophy and facet arthropathy. Prevertebral and paraspinal soft tissue planes are nonsuspicious though, rotated lateral radiograph may decrease sensitivity.  IMPRESSION: No acute fracture deformity or malalignment on this habitus limited examination though, if clinical suspicion spine injury, CT of the cervical spine would be more sensitive.   Electronically Signed  By: Awilda Metroourtnay  Bloomer   On: 03/24/2014 23:08   Dg Thoracic Spine 2 View  03/24/2014   CLINICAL DATA:  Fall from standing at home, prior syncopal episodes. RIGHT leg injury.  EXAM: THORACIC SPINE - 2 VIEW  COMPARISON:  CT of the chest August 30, 2016  FINDINGS: There is no evidence of acute thoracic spine fracture. Moderate chronic appearing T12 compression fracture. Alignment is normal. No other significant bone abnormalities are identified.  IMPRESSION: No acute thoracolumbar spine fracture deformity or malalignment.   Electronically Signed   By: Awilda Metroourtnay  Bloomer   On: 03/24/2014 23:15   Dg Lumbar Spine Complete  03/24/2014   CLINICAL DATA:  Fall from standing at home, prior syncopal episodes. RIGHT leg injury.  EXAM: LUMBAR SPINE - COMPLETE 4+ VIEW  COMPARISON:  None.  FINDINGS: Lumbar vertebral bodies appear intact and aligned with maintenance of lumbar lordosis. Severe L4-5 degenerative disc, moderate at L5-S1. No pars interarticularis defects. Moderate lower lumbar facet arthropathy without destructive bony lesions.  Moderate chronic wedging of vertebral body T12. Epidural catheter in place, distal tip projecting at T12. Additional catheter in the pelvis on single view, which could be external to the patient. Phleboliths in the pelvis.  IMPRESSION: Lumbar spondylosis without acute fracture deformity or malalignment.   Electronically Signed   By: Awilda Metroourtnay  Bloomer   On: 03/24/2014 23:11   Dg Hip Complete Right  03/24/2014   CLINICAL DATA:  Fall from standing at home, prior syncopal episodes. RIGHT leg injury.  EXAM: RIGHT HIP - COMPLETE 2+ VIEW  COMPARISON:  None.  FINDINGS: Status post LEFT hip total arthroplasty, mild osteolysis about the acetabular cup, visualized hardware appears intact. RIGHT femoral head is well formed and located. Thickened femoral head neck junction with followup. Moderate RIGHT hip joint space narrowing with periarticular sclerosis. No destructive bony lesions. Phleboliths in the pelvis. Periarticular soft tissue planes are nonsuspicious.  IMPRESSION: No acute fracture deformity or dislocation.  Thickened RIGHT femoral head neck junction can be seen with femoral acetabular impingement syndrome.  LEFT hip total  arthroplasty with mild osteolysis about the acetabular cup.   Electronically Signed   By: Awilda Metroourtnay  Bloomer   On: 03/24/2014 23:06    MDM  Syncope felt to be vasovagal in etiology.patient has an appointment with his pain clinic physician at 8 AM tomorrow Final diagnoses:  Fall  diagnosis #1 fall #2 chronic pain #3 syncope      Doug SouSam Gabi Mcfate, MD 03/25/14 0010  Doug SouSam Ewell Benassi, MD 03/25/14 0020

## 2014-03-24 NOTE — ED Notes (Signed)
Ambulated patient in hallway. Patient tolerated walk well with no reported abnormality.

## 2014-03-24 NOTE — ED Notes (Signed)
Pt from home. Stood up and fell face forward and injured right leg. Right leg rotated and painful on palpation. Pt does not remember how long he was unconscious. Pt has had syncopal episodes in  The past, back, and neck surgeries, opoid addiction, and chronic back pain. Wife reports pt gets confused when he runs out of his Opana and has fallen from the confusion. Pt has decreased mobility in lower extremities and uses a walker at home. Pt neuro intact at this time.

## 2014-03-24 NOTE — ED Notes (Addendum)
Dr. Ethelda ChickJacubowitz made aware of pt feeling like he is choking, 2 episodes lasting about 1 minute each.

## 2014-03-25 NOTE — Discharge Instructions (Signed)
Fall Prevention and Home Safety Keep your scheduled appointment with your pain management clinic at 8 AM today Falls cause injuries and can affect all age groups. It is possible to use preventive measures to significantly decrease the likelihood of falls. There are many simple measures which can make your home safer and prevent falls. OUTDOORS  Repair cracks and edges of walkways and driveways.  Remove high doorway thresholds.  Trim shrubbery on the main path into your home.  Have good outside lighting.  Clear walkways of tools, rocks, debris, and clutter.  Check that handrails are not broken and are securely fastened. Both sides of steps should have handrails.  Have leaves, snow, and ice cleared regularly.  Use sand or salt on walkways during winter months.  In the garage, clean up grease or oil spills. BATHROOM  Install night lights.  Install grab bars by the toilet and in the tub and shower.  Use non-skid mats or decals in the tub or shower.  Place a plastic non-slip stool in the shower to sit on, if needed.  Keep floors dry and clean up all water on the floor immediately.  Remove soap buildup in the tub or shower on a regular basis.  Secure bath mats with non-slip, double-sided rug tape.  Remove throw rugs and tripping hazards from the floors. BEDROOMS  Install night lights.  Make sure a bedside light is easy to reach.  Do not use oversized bedding.  Keep a telephone by your bedside.  Have a firm chair with side arms to use for getting dressed.  Remove throw rugs and tripping hazards from the floor. KITCHEN  Keep handles on pots and pans turned toward the center of the stove. Use back burners when possible.  Clean up spills quickly and allow time for drying.  Avoid walking on wet floors.  Avoid hot utensils and knives.  Position shelves so they are not too high or low.  Place commonly used objects within easy reach.  If necessary, use a sturdy  step stool with a grab bar when reaching.  Keep electrical cables out of the way.  Do not use floor polish or wax that makes floors slippery. If you must use wax, use non-skid floor wax.  Remove throw rugs and tripping hazards from the floor. STAIRWAYS  Never leave objects on stairs.  Place handrails on both sides of stairways and use them. Fix any loose handrails. Make sure handrails on both sides of the stairways are as long as the stairs.  Check carpeting to make sure it is firmly attached along stairs. Make repairs to worn or loose carpet promptly.  Avoid placing throw rugs at the top or bottom of stairways, or properly secure the rug with carpet tape to prevent slippage. Get rid of throw rugs, if possible.  Have an electrician put in a light switch at the top and bottom of the stairs. OTHER FALL PREVENTION TIPS  Wear low-heel or rubber-soled shoes that are supportive and fit well. Wear closed toe shoes.  When using a stepladder, make sure it is fully opened and both spreaders are firmly locked. Do not climb a closed stepladder.  Add color or contrast paint or tape to grab bars and handrails in your home. Place contrasting color strips on first and last steps.  Learn and use mobility aids as needed. Install an electrical emergency response system.  Turn on lights to avoid dark areas. Replace light bulbs that burn out immediately. Get light switches that glow.  Arrange furniture to create clear pathways. Keep furniture in the same place.  Firmly attach carpet with non-skid or double-sided tape.  Eliminate uneven floor surfaces.  Select a carpet pattern that does not visually hide the edge of steps.  Be aware of all pets. OTHER HOME SAFETY TIPS  Set the water temperature for 120 F (48.8 C).  Keep emergency numbers on or near the telephone.  Keep smoke detectors on every level of the home and near sleeping areas. Document Released: 04/16/2002 Document Revised:  10/26/2011 Document Reviewed: 07/16/2011 [facility] Patient Information 2015 [place], [place]. This information is not intended to replace advice given to you by your health care provider. Make sure you discuss any questions you have with your health care provider.

## 2014-05-06 ENCOUNTER — Ambulatory Visit (INDEPENDENT_AMBULATORY_CARE_PROVIDER_SITE_OTHER): Payer: Medicare Other | Admitting: Adult Health

## 2014-05-06 ENCOUNTER — Encounter: Payer: Self-pay | Admitting: Adult Health

## 2014-05-06 VITALS — BP 116/72 | HR 84 | Ht 70.0 in | Wt 280.8 lb

## 2014-05-06 DIAGNOSIS — G894 Chronic pain syndrome: Secondary | ICD-10-CM

## 2014-05-06 DIAGNOSIS — G4486 Cervicogenic headache: Secondary | ICD-10-CM

## 2014-05-06 DIAGNOSIS — R51 Headache: Secondary | ICD-10-CM

## 2014-05-06 DIAGNOSIS — G609 Hereditary and idiopathic neuropathy, unspecified: Secondary | ICD-10-CM

## 2014-05-06 DIAGNOSIS — R569 Unspecified convulsions: Secondary | ICD-10-CM

## 2014-05-06 NOTE — Progress Notes (Signed)
I have read the note, and I agree with the clinical assessment and plan.  WILLIS,CHARLES KEITH   

## 2014-05-06 NOTE — Progress Notes (Signed)
PATIENT: Alex BlamerJohn T Silliman DOB: November 03, 1950  REASON FOR VISIT: follow up HISTORY FROM: patient  HISTORY OF PRESENT ILLNESS: Alex Mcintosh is a 63 year old male with a history of chronic pain syndrome, seizures and cervicogenic headaches. He returns today for follow-up. He is currently taking Keppra 1000 mg twice a day for seizures. He reports that he has not has any recent seizures. Patient also takes Cymbalta 120 mg daily and propranolol 20 mg twice a day for headaches and peripheral neuropathy.He reports that he has approximately 2 severe headaches a month. He reports that the Cymbalta seems to be helping the numbness and tingling the feet. He states that he has a hard time walking because of his hips and knees. He uses a cane and a rolling walker when ambulating. He states that his falls have decreased. He has fallen 1-2 times in the last month.  He is seen at a pain clinic for his chronic pain.   HISTORY 10/26/13 (Alex Mcintosh): Alex Mcintosh is a 63 year old right-handed white male with a history of a chronic pain syndrome, currently followed through a pain center on chronic opiate medications. He was originally referred to this office for seizure-type events. The patient had been unresponsive to increases in his antiepileptic medication, and an EEG study was eventually done that reported 2 clinical events. 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 The patient continues to have occasional episodes of jerking. He also has a history of a gait disorder, and he has a walker for ambulation, but he still falls frequently, commonly when he is not using his walker. He has chronic neck discomfort, with a prior history of cervical spine surgery. He indicates that he has headaches coming up from the back of the neck, and around the head. The headaches are daily in nature, better in the morning and worse as the day goes on. He indicates that the headaches have become severe over the last 8 years. Several weeks ago,  he ran out of his Cymbalta and his propranolol. His headache pains and peripheral neuropathy discomfort may have worsened some when this was discontinued. He returns to this office for an evaluation. The patient indicates that he does not engage in regular stretching exercises.  REVIEW OF SYSTEMS: Out of a complete 14 system review of symptoms, the patient complains only of the following symptoms, and all other reviewed systems are negative.  Fatigue, excessive sweating, cold intolerance, frequent waking, daytime sleepiness, joint pain, back pain, difficulty, neck pain, neck stiffness, bruise/bleed easily, memory loss, weakness, confusion  ALLERGIES: Allergies  Allergen Reactions  . Ceftin [Cefuroxime Axetil] Hives  . Cefuroxime Axetil     Other reaction(s): RASH  . Iodinated Diagnostic Agents Other (See Comments)    unknown  . Latex Other (See Comments)    unknown  . Shellfish-Derived Products Other (See Comments)    unknown  . Statins Other (See Comments)    Messes with liver enzymes    HOME MEDICATIONS: Outpatient Prescriptions Prior to Visit  Medication Sig Dispense Refill  . carisoprodol (SOMA) 350 MG tablet Take 1 tablet (350 mg total) by mouth 3 (three) times daily as needed for muscle spasms. 30 tablet 0  . diazepam (VALIUM) 2 MG tablet Take 1 tablet (2 mg total) by mouth every 8 (eight) hours as needed for anxiety. 30 tablet 0  . diphenhydrAMINE (SOMINEX) 25 MG tablet Take 25 mg by mouth at bedtime as needed for sleep.    Marland Kitchen. docusate sodium (COLACE) 100  MG capsule Take 100 mg by mouth daily as needed for mild constipation.    . DULoxetine (CYMBALTA) 60 MG capsule TAKE 2 CAPSULES BY MOUTH IN THE MORNING 60 capsule 3  . Eszopiclone (ESZOPICLONE) 3 MG TABS Take 3 mg by mouth at bedtime. Take immediately before bedtime    . ezetimibe (ZETIA) 10 MG tablet Take 10 mg by mouth daily.    Marland Kitchen levETIRAcetam (KEPPRA) 1000 MG tablet TAKE 1 TABLET (1,000 MG TOTAL) BY MOUTH 2 (TWO) TIMES  DAILY. 60 tablet 5  . Multiple Vitamin (MULTIVITAMIN WITH MINERALS) TABS tablet Take 1 tablet by mouth every morning.     Marland Kitchen omeprazole (PRILOSEC) 20 MG capsule Take 20 mg by mouth every morning.     Marland Kitchen OVER THE COUNTER MEDICATION Take 2 tablets by mouth at bedtime. somnapure nature sleep aid    . oxymorphone (OPANA ER) 30 MG 12 hr tablet Take 30 mg by mouth 3 (three) times daily.    . propranolol (INDERAL) 20 MG tablet TAKE 1 TABLET BY MOUTH TWICE A DAY 60 tablet 3  . Tapentadol HCl (NUCYNTA) 100 MG TABS Take 1 tablet by mouth 3 (three) times daily.    . temazepam (RESTORIL) 15 MG capsule Take 1 capsule (15 mg total) by mouth at bedtime. (Patient not taking: Reported on 03/24/2014) 10 capsule 0   No facility-administered medications prior to visit.    PAST MEDICAL HISTORY: Past Medical History  Diagnosis Date  . Hyperlipemia   . Chronic back pain   . GERD (gastroesophageal reflux disease)   . Anxiety   . Chronic narcotic dependence   . Hypothyroidism   . Depression   . Myoclonus   . UTI (lower urinary tract infection) 5/14    was confused and was found in [place].  Marland Kitchen Acute encephalopathy 09/06/12    was [facility] to [facility]  . Foot drop, bilateral   . Shortness of breath     with exertion  . CHF (congestive heart failure)     diurects stopped in April  . DVT (deep venous thrombosis)     01/2012  . Seizures     Myoclonus  . Neuropathy     legs  . Arthritis   . Memory loss of unknown cause   . AVN (avascular necrosis of bone)     right  . Obese   . History of cerebrovascular disease   . Abnormality of gait 08/03/2013  . Cervicogenic headache 10/26/2013    PAST SURGICAL HISTORY: Past Surgical History  Procedure Laterality Date  . Back surgery      4 lumbar  . Hip surgery Left   . Cervical fusion    . Knee arthroscopy Right   . Joint replacement    . Pain pump implantation  Reports no longer has pump  . Tooth extraction N/A 11/16/2012    Procedure: DENTAL EXTRACTIONS OF  MULTIPLE NONRESTORABLE TEETH;  Surgeon: Hinton Dyer, DDS;  Location: [facility] OR;  Service: Oral Surgery;  Laterality: N/A;  #12, 14, 15, 22, 23, 24, 25, 26, 27, 28    FAMILY HISTORY: Family History  Problem Relation Age of Onset  . CAD Father   . Heart disease Father   . Heart failure Mother   . Breast cancer Sister   . CAD Brother     SOCIAL HISTORY: History   Social History  . Marital Status: Married    Spouse Name: Mindi Junker    Number of Children: 3  . Years of  Education: COLLEGE-M   Occupational History  .  Other    disability   Social History Main Topics  . Smoking status: Never Smoker   . Smokeless tobacco: Never Used  . Alcohol Use: Yes     Comment: OCCASSIONAL  . Drug Use: No  . Sexual Activity: Not on file   Other Topics Concern  . Not on file   Social History Narrative   Patient lives at home with spouse.   Caffeine Use:1-3 cups daily      PHYSICAL EXAM  Filed Vitals:   05/06/14 1410  BP: 116/72  Pulse: 84  Height: 5\' 10"  (1.778 m)  Weight: 280 lb 12.8 oz (127.37 kg)   Body mass index is 40.29 kg/(m^2).  Generalized: Well developed, in no acute distress   Neurological examination  Mentation: Alert oriented to time, place, history taking. Follows all commands speech and language fluent Cranial nerve II-XII: Pupils were equal round reactive to light. Extraocular movements were full, visual field were full on confrontational test. Facial sensation and strength were normal. Uvula tongue midline. Head turning and shoulder shrug  were normal and symmetric. Motor: The motor testing reveals 5 over 5 strength of all 4 extremities. Good symmetric motor tone is noted throughout.  Sensory: Sensory testing is intact to soft touch on all 4 extremities except decreased on the right arm. No evidence of extinction is noted.  Coordination: Cerebellar testing reveals good finger-nose-finger bilaterally and heel-to-shin on the right with mild difficulty on the left due  to hip pain..  Gait and station: Gait is slightly wide base, slow and very cautious. He uses a cane for ambulation. Tandem gait not attempted. Romberg is negative. No drift is seen.  Reflexes: Deep tendon reflexes are symmetric but depressed throughout   DIAGNOSTIC DATA (LABS, IMAGING, TESTING) - I reviewed patient records, labs, notes, testing and imaging myself where available.  Lab Results  Component Value Date   WBC 11.1* 03/24/2014   HGB 16.6 03/24/2014   HCT 47.6 03/24/2014   MCV 86.9 03/24/2014   PLT 181 03/24/2014      Component Value Date/Time   NA 141 03/24/2014 2150   K 4.1 03/24/2014 2150   CL 101 03/24/2014 2150   CO2 24 03/24/2014 2150   GLUCOSE 113* 03/24/2014 2150   BUN 5* 03/24/2014 2150   CREATININE 0.71 03/24/2014 2150   CALCIUM 9.6 03/24/2014 2150   PROT 6.8 02/13/2013 1304   ALBUMIN 3.5 02/13/2013 1304   AST 21 02/13/2013 1304   ALT 16 02/13/2013 1304   ALKPHOS 106 02/13/2013 1304   BILITOT 0.5 02/13/2013 1304   GFRNONAA >90 03/24/2014 2150   GFRAA >90 03/24/2014 2150   Lab Results  Component Value Date   CHOL 186 08/31/2013   HDL 61 08/31/2013   LDLCALC 103* 08/31/2013   TRIG 111 08/31/2013   CHOLHDL 3.0 08/31/2013   No results found for: HGBA1C Lab Results  Component Value Date   VITAMINB12 812 09/06/2012   Lab Results  Component Value Date   TSH 1.716 02/12/2013      ASSESSMENT AND PLAN 63 y.o. year old male  has a past medical history of Hyperlipemia; Chronic back pain; GERD (gastroesophageal reflux disease); Anxiety; Chronic narcotic dependence; Hypothyroidism; Depression; Myoclonus; UTI (lower urinary tract infection) (5/14); Acute encephalopathy (09/06/12); Foot drop, bilateral; Shortness of breath; CHF (congestive heart failure); DVT (deep venous thrombosis); Seizures; Neuropathy; Arthritis; Memory loss of unknown cause; AVN (avascular necrosis of bone); Obese; History of cerebrovascular disease;  Abnormality of gait (08/03/2013); and  Cervicogenic headache (10/26/2013). here with:  1. Seizures 2. Cervicogenic headache 3. Peripheral neuropathy 4. Chronic pain  Overall the patient has remained stable. He feels that the Cymbalta is helping with his neuropathy discomfort. He will continue taking Cymbalta 120 mg daily. The patient denies any recent seizures. He is taking Keppra 5000 mg daily. No refills needed today. The patient is only having 1-2 severe migraines a month. He will continue propranolol 20 mg twice a day. I have advised patient to use his cane and/or walker at all times to prevent falls. He verbalized understanding. If the patient has any additional seizures he should let us know. Otherwise he will follow-up in 6 months or sooner if needed.  Butch Penny, MSN, NP-C 05/06/2014, 2:01 PM [facility] [address], [address] [place], [place] [address] [phone]  Note: This document was prepared with digital dictation and possible smart phrase technology. Any transcriptional errors that result from this process are unintentional.

## 2014-05-06 NOTE — Patient Instructions (Signed)
Continue Cymbalta, propranolol and Keppra.  If you have any additional seizures please let us know.  Continue to follow-up with your pain clinic.  Please use your cane and walker at all times to help prevent falls.

## 2014-07-20 ENCOUNTER — Other Ambulatory Visit: Payer: Self-pay | Admitting: Neurology

## 2014-08-03 ENCOUNTER — Other Ambulatory Visit: Payer: Self-pay | Admitting: Neurology

## 2014-10-09 ENCOUNTER — Emergency Department (HOSPITAL_COMMUNITY): Payer: Medicare Other

## 2014-10-09 ENCOUNTER — Emergency Department (HOSPITAL_COMMUNITY)
Admission: EM | Admit: 2014-10-09 | Discharge: 2014-10-09 | Disposition: A | Discharge status: Home / Self Care | Payer: Medicare Other | Attending: Emergency Medicine | Admitting: Emergency Medicine

## 2014-10-09 DIAGNOSIS — W1839XA Other fall on same level, initial encounter: Secondary | ICD-10-CM | POA: Insufficient documentation

## 2014-10-09 DIAGNOSIS — Z8739 Personal history of other diseases of the musculoskeletal system and connective tissue: Secondary | ICD-10-CM | POA: Diagnosis not present

## 2014-10-09 DIAGNOSIS — G40909 Epilepsy, unspecified, not intractable, without status epilepticus: Secondary | ICD-10-CM | POA: Diagnosis not present

## 2014-10-09 DIAGNOSIS — Z8673 Personal history of transient ischemic attack (TIA), and cerebral infarction without residual deficits: Secondary | ICD-10-CM | POA: Diagnosis not present

## 2014-10-09 DIAGNOSIS — Y9389 Activity, other specified: Secondary | ICD-10-CM | POA: Insufficient documentation

## 2014-10-09 DIAGNOSIS — I509 Heart failure, unspecified: Secondary | ICD-10-CM | POA: Diagnosis not present

## 2014-10-09 DIAGNOSIS — S0990XA Unspecified injury of head, initial encounter: Secondary | ICD-10-CM | POA: Diagnosis present

## 2014-10-09 DIAGNOSIS — Y9289 Other specified places as the place of occurrence of the external cause: Secondary | ICD-10-CM | POA: Insufficient documentation

## 2014-10-09 DIAGNOSIS — Z79899 Other long term (current) drug therapy: Secondary | ICD-10-CM | POA: Insufficient documentation

## 2014-10-09 DIAGNOSIS — E669 Obesity, unspecified: Secondary | ICD-10-CM | POA: Diagnosis not present

## 2014-10-09 DIAGNOSIS — Z8639 Personal history of other endocrine, nutritional and metabolic disease: Secondary | ICD-10-CM | POA: Diagnosis not present

## 2014-10-09 DIAGNOSIS — K219 Gastro-esophageal reflux disease without esophagitis: Secondary | ICD-10-CM | POA: Insufficient documentation

## 2014-10-09 DIAGNOSIS — G8929 Other chronic pain: Secondary | ICD-10-CM | POA: Diagnosis not present

## 2014-10-09 DIAGNOSIS — Z86718 Personal history of other venous thrombosis and embolism: Secondary | ICD-10-CM | POA: Insufficient documentation

## 2014-10-09 DIAGNOSIS — Y998 Other external cause status: Secondary | ICD-10-CM | POA: Diagnosis not present

## 2014-10-09 DIAGNOSIS — Z87440 Personal history of urinary (tract) infections: Secondary | ICD-10-CM | POA: Insufficient documentation

## 2014-10-09 DIAGNOSIS — W19XXXA Unspecified fall, initial encounter: Secondary | ICD-10-CM

## 2014-10-09 DIAGNOSIS — Z91040 Latex allergy status: Secondary | ICD-10-CM | POA: Insufficient documentation

## 2014-10-09 LAB — COMPREHENSIVE METABOLIC PANEL
ALK PHOS: 103 U/L (ref 38–126)
ALT: 21 U/L (ref 17–63)
ANION GAP: 7 (ref 5–15)
AST: 29 U/L (ref 15–41)
Albumin: 3.5 g/dL (ref 3.5–5.0)
BILIRUBIN TOTAL: 0.9 mg/dL (ref 0.3–1.2)
BUN: 12 mg/dL (ref 6–20)
CHLORIDE: 105 mmol/L (ref 101–111)
CO2: 27 mmol/L (ref 22–32)
CREATININE: 0.63 mg/dL (ref 0.61–1.24)
Calcium: 8.4 mg/dL — ABNORMAL LOW (ref 8.9–10.3)
GFR calc Af Amer: >60 mL/min (ref >60)
GFR calc non Af Amer: >60 mL/min (ref >60)
Glucose, Bld: 118 mg/dL — ABNORMAL HIGH (ref 65–99)
Potassium: 4.3 mmol/L (ref 3.5–5.1)
Sodium: 139 mmol/L (ref 135–145)
Total Protein: 6.5 g/dL (ref 6.5–8.1)

## 2014-10-09 LAB — I-STAT TROPONIN, ED: Troponin i, poc: 0 ng/mL (ref 0.00–0.08)

## 2014-10-09 LAB — URINALYSIS, ROUTINE W REFLEX MICROSCOPIC
Bilirubin Urine: NEGATIVE
Glucose, UA: NEGATIVE mg/dL
Hgb urine dipstick: NEGATIVE
KETONES UR: 40 mg/dL — AB
Leukocytes, UA: NEGATIVE
NITRITE: NEGATIVE
Protein, ur: NEGATIVE mg/dL
Specific Gravity, Urine: 1.029 (ref 1.005–1.030)
UROBILINOGEN UA: 0.2 mg/dL (ref 0.0–1.0)
pH: 5.5 (ref 5.0–8.0)

## 2014-10-09 LAB — CBC WITH DIFFERENTIAL/PLATELET
BASOS ABS: 0 10*3/uL (ref 0.0–0.1)
Basophils Relative: 0 % (ref 0–1)
EOS PCT: 1 % (ref 0–5)
Eosinophils Absolute: 0 10*3/uL (ref 0.0–0.7)
HEMATOCRIT: 42.3 % (ref 39.0–52.0)
HEMOGLOBIN: 13.6 g/dL (ref 13.0–17.0)
LYMPHS ABS: 2.3 10*3/uL (ref 0.7–4.0)
Lymphocytes Relative: 28 % (ref 12–46)
MCH: 28.3 pg (ref 26.0–34.0)
MCHC: 32.2 g/dL (ref 30.0–36.0)
MCV: 87.9 fL (ref 78.0–100.0)
Monocytes Absolute: 0.6 10*3/uL (ref 0.1–1.0)
Monocytes Relative: 7 % (ref 3–12)
Neutro Abs: 5.5 10*3/uL (ref 1.7–7.7)
Neutrophils Relative %: 64 % (ref 43–77)
Platelets: 155 10*3/uL (ref 150–400)
RBC: 4.81 MIL/uL (ref 4.22–5.81)
RDW: 13.2 % (ref 11.5–15.5)
WBC: 8.5 10*3/uL (ref 4.0–10.5)

## 2014-10-09 NOTE — ED Notes (Signed)
Per EMS-frequent falls this week-fell this am-states he is feeling weak-per EMS appears sedated, slow to respond-chronic back pain-pain to face where he hit it on table-BP 120/72-94% on 2L-CBG 116

## 2014-10-09 NOTE — Discharge Instructions (Signed)
Please be careful ambulating. Follow up with your doctor for recheck. Return if worsening.    Fall Prevention and Home Safety Falls cause injuries and can affect all age groups. It is possible to use preventive measures to significantly decrease the likelihood of falls. There are many simple measures which can make your home safer and prevent falls. OUTDOORS  Repair cracks and edges of walkways and driveways.  Remove high doorway thresholds.  Trim shrubbery on the main path into your home.  Have good outside lighting.  Clear walkways of tools, rocks, debris, and clutter.  Check that handrails are not broken and are securely fastened. Both sides of steps should have handrails.  Have leaves, snow, and ice cleared regularly.  Use sand or salt on walkways during winter months.  In the garage, clean up grease or oil spills. BATHROOM  Install night lights.  Install grab bars by the toilet and in the tub and shower.  Use non-skid mats or decals in the tub or shower.  Place a plastic non-slip stool in the shower to sit on, if needed.  Keep floors dry and clean up all water on the floor immediately.  Remove soap buildup in the tub or shower on a regular basis.  Secure bath mats with non-slip, double-sided rug tape.  Remove throw rugs and tripping hazards from the floors. BEDROOMS  Install night lights.  Make sure a bedside light is easy to reach.  Do not use oversized bedding.  Keep a telephone by your bedside.  Have a firm chair with side arms to use for getting dressed.  Remove throw rugs and tripping hazards from the floor. KITCHEN  Keep handles on pots and pans turned toward the center of the stove. Use back burners when possible.  Clean up spills quickly and allow time for drying.  Avoid walking on wet floors.  Avoid hot utensils and knives.  Position shelves so they are not too high or low.  Place commonly used objects within easy reach.  If necessary,  use a sturdy step stool with a grab bar when reaching.  Keep electrical cables out of the way.  Do not use floor polish or wax that makes floors slippery. If you must use wax, use non-skid floor wax.  Remove throw rugs and tripping hazards from the floor. STAIRWAYS  Never leave objects on stairs.  Place handrails on both sides of stairways and use them. Fix any loose handrails. Make sure handrails on both sides of the stairways are as long as the stairs.  Check carpeting to make sure it is firmly attached along stairs. Make repairs to worn or loose carpet promptly.  Avoid placing throw rugs at the top or bottom of stairways, or properly secure the rug with carpet tape to prevent slippage. Get rid of throw rugs, if possible.  Have an electrician put in a light switch at the top and bottom of the stairs. OTHER FALL PREVENTION TIPS  Wear low-heel or rubber-soled shoes that are supportive and fit well. Wear closed toe shoes.  When using a stepladder, make sure it is fully opened and both spreaders are firmly locked. Do not climb a closed stepladder.  Add color or contrast paint or tape to grab bars and handrails in your home. Place contrasting color strips on first and last steps.  Learn and use mobility aids as needed. Install an electrical emergency response system.  Turn on lights to avoid dark areas. Replace light bulbs that burn out immediately. Get  light switches that glow.  Arrange furniture to create clear pathways. Keep furniture in the same place.  Firmly attach carpet with non-skid or double-sided tape.  Eliminate uneven floor surfaces.  Select a carpet pattern that does not visually hide the edge of steps.  Be aware of all pets. OTHER HOME SAFETY TIPS  Set the water temperature for 120 F (48.8 C).  Keep emergency numbers on or near the telephone.  Keep smoke detectors on every level of the home and near sleeping areas. Document Released: 04/16/2002 Document  Revised: 10/26/2011 Document Reviewed: 07/16/2011 [facility] Patient Information 2015 [place], [place]. This information is not intended to replace advice given to you by your health care provider. Make sure you discuss any questions you have with your health care provider.
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
## 2014-10-09 NOTE — ED Notes (Signed)
Bed: WA07 Expected date:  Expected time:  Means of arrival:  Comments: EMS-FALLS

## 2014-10-09 NOTE — ED Notes (Signed)
Patient transported to X-ray 

## 2014-11-18 ENCOUNTER — Ambulatory Visit: Payer: Medicare Other | Admitting: Adult Health

## 2014-11-19 ENCOUNTER — Encounter: Payer: Self-pay | Admitting: Adult Health

## 2014-12-03 ENCOUNTER — Other Ambulatory Visit: Payer: Self-pay | Admitting: Neurology

## 2014-12-03 NOTE — Telephone Encounter (Signed)
No showed last appt  

## 2014-12-16 ENCOUNTER — Emergency Department (HOSPITAL_COMMUNITY)
Admission: EM | Admit: 2014-12-16 | Discharge: 2014-12-17 | Disposition: A | Discharge status: Home / Self Care | Payer: Medicare Other | Attending: Emergency Medicine | Admitting: Emergency Medicine

## 2014-12-16 ENCOUNTER — Encounter (HOSPITAL_COMMUNITY): Payer: Self-pay | Admitting: *Deleted

## 2014-12-16 DIAGNOSIS — Z79899 Other long term (current) drug therapy: Secondary | ICD-10-CM | POA: Diagnosis not present

## 2014-12-16 DIAGNOSIS — F419 Anxiety disorder, unspecified: Secondary | ICD-10-CM | POA: Diagnosis not present

## 2014-12-16 DIAGNOSIS — G40909 Epilepsy, unspecified, not intractable, without status epilepticus: Secondary | ICD-10-CM | POA: Insufficient documentation

## 2014-12-16 DIAGNOSIS — Z87440 Personal history of urinary (tract) infections: Secondary | ICD-10-CM | POA: Insufficient documentation

## 2014-12-16 DIAGNOSIS — E669 Obesity, unspecified: Secondary | ICD-10-CM | POA: Diagnosis not present

## 2014-12-16 DIAGNOSIS — R2242 Localized swelling, mass and lump, left lower limb: Secondary | ICD-10-CM | POA: Diagnosis not present

## 2014-12-16 DIAGNOSIS — Z91040 Latex allergy status: Secondary | ICD-10-CM | POA: Diagnosis not present

## 2014-12-16 DIAGNOSIS — G8929 Other chronic pain: Secondary | ICD-10-CM | POA: Insufficient documentation

## 2014-12-16 DIAGNOSIS — Z86718 Personal history of other venous thrombosis and embolism: Secondary | ICD-10-CM | POA: Insufficient documentation

## 2014-12-16 DIAGNOSIS — F329 Major depressive disorder, single episode, unspecified: Secondary | ICD-10-CM | POA: Insufficient documentation

## 2014-12-16 DIAGNOSIS — M1990 Unspecified osteoarthritis, unspecified site: Secondary | ICD-10-CM | POA: Diagnosis not present

## 2014-12-16 DIAGNOSIS — I509 Heart failure, unspecified: Secondary | ICD-10-CM | POA: Insufficient documentation

## 2014-12-16 DIAGNOSIS — M79605 Pain in left leg: Secondary | ICD-10-CM | POA: Diagnosis present

## 2014-12-16 LAB — COMPREHENSIVE METABOLIC PANEL
ALK PHOS: 102 U/L (ref 38–126)
ALT: 17 U/L (ref 17–63)
ANION GAP: 8 (ref 5–15)
AST: 26 U/L (ref 15–41)
Albumin: 3.6 g/dL (ref 3.5–5.0)
BILIRUBIN TOTAL: 0.6 mg/dL (ref 0.3–1.2)
BUN: 11 mg/dL (ref 6–20)
CHLORIDE: 104 mmol/L (ref 101–111)
CO2: 29 mmol/L (ref 22–32)
Calcium: 9.2 mg/dL (ref 8.9–10.3)
Creatinine, Ser: 0.67 mg/dL (ref 0.61–1.24)
GFR calc Af Amer: >60 mL/min (ref >60)
GFR calc non Af Amer: >60 mL/min (ref >60)
Glucose, Bld: 109 mg/dL — ABNORMAL HIGH (ref 65–99)
Potassium: 4.2 mmol/L (ref 3.5–5.1)
Sodium: 141 mmol/L (ref 135–145)
Total Protein: 7.1 g/dL (ref 6.5–8.1)

## 2014-12-16 LAB — CBC WITH DIFFERENTIAL/PLATELET
Basophils Absolute: 0 10*3/uL (ref 0.0–0.1)
Basophils Relative: 0 % (ref 0–1)
EOS PCT: 1 % (ref 0–5)
Eosinophils Absolute: 0.1 10*3/uL (ref 0.0–0.7)
HEMATOCRIT: 44.7 % (ref 39.0–52.0)
Hemoglobin: 14.8 g/dL (ref 13.0–17.0)
Lymphocytes Relative: 31 % (ref 12–46)
Lymphs Abs: 2.7 10*3/uL (ref 0.7–4.0)
MCH: 29.4 pg (ref 26.0–34.0)
MCHC: 33.1 g/dL (ref 30.0–36.0)
MCV: 88.7 fL (ref 78.0–100.0)
Monocytes Absolute: 0.8 10*3/uL (ref 0.1–1.0)
Monocytes Relative: 9 % (ref 3–12)
Neutro Abs: 5.2 10*3/uL (ref 1.7–7.7)
Neutrophils Relative %: 59 % (ref 43–77)
PLATELETS: 206 10*3/uL (ref 150–400)
RBC: 5.04 MIL/uL (ref 4.22–5.81)
RDW: 14 % (ref 11.5–15.5)
WBC: 8.9 10*3/uL (ref 4.0–10.5)

## 2014-12-16 MED ORDER — LORAZEPAM 2 MG/ML IJ SOLN
INTRAMUSCULAR | Status: AC
  Filled 2014-12-16: qty 1

## 2014-12-16 MED ORDER — LORAZEPAM 2 MG/ML IJ SOLN
1.0000 mg | Freq: Once | INTRAMUSCULAR | Status: AC
  Administered 2014-12-16: 1 mg via INTRAVENOUS

## 2014-12-16 MED ORDER — OXYCODONE-ACETAMINOPHEN 5-325 MG PO TABS
2.0000 | ORAL_TABLET | Freq: Once | ORAL | Status: AC
  Administered 2014-12-17: 2 via ORAL
  Filled 2014-12-16: qty 2

## 2014-12-16 NOTE — ED Notes (Signed)
Pt was brought straight from lobby without being triaged due to his condition of possible seizure

## 2014-12-16 NOTE — ED Notes (Signed)
Pt was brought in by [employer],  Pt was sat in lobby and report was to be given to triage, in the interim pt began to have a seizure so he was rushed to [employer],

## 2014-12-16 NOTE — ED Notes (Addendum)
Pt came from home via [facility]. Patient is complaining of left upper leg pain. Pt has a history of DVT. Pt states their is a hard spot on the left upper lateral thigh. EMS reports it is a little hot to touch and swollen. Symptoms started about 2.5 hours ago. Pt is able to ambulate without assistance.

## 2014-12-16 NOTE — ED Provider Notes (Signed)
CSN: [id number]     Arrival date & time 12/16/14  1945 History   First MD Initiated Contact with Patient 12/16/14 2109     Chief Complaint  Patient presents with  . Leg Pain  . Seizures     (Consider location/radiation/quality/duration/timing/severity/associated sxs/prior Treatment) HPI Comments: Patient with a complicated medical history including seizures on Keppra and Valium ([place]), chronic pain syndrome managed by pain management, CHF, DVT not anticoagulated after completing recommended therapy (2014), obesity, presents via EMS for evaluation of pain and swelling of LLE. Over the last 1-2 days there has been soreness and a progressive mass-like swelling of the lateral left thigh that is painful. No redness, fever, SOB, posterior thigh pain or calf pain. No fall or injury. He has a history of DVT but no recurrence after initial episode and he is, subsequently, not anticoagulated. No SOB, N, V, abdominal pain.  After arrival to the emergency department, the patient was waiting in the lobby and had a seizure while waiting. He reports compliance with his Keppra. Per wife, he has breakthrough seizures "sometimes" but this one last longer than usual. The patient reports not sleeping well lately.   Patient is a 64 y.o. male presenting with leg pain and seizures. The history is provided by the patient and the spouse. No language interpreter was used.  Leg Pain Location:  Leg Injury: no   Leg location:  L upper leg Chronicity:  New Associated symptoms: no fever   Seizures   Past Medical History  Diagnosis Date  . Hyperlipemia   . Chronic back pain   . GERD (gastroesophageal reflux disease)   . Anxiety   . Chronic narcotic dependence   . Hypothyroidism   . Depression   . Myoclonus   . UTI (lower urinary tract infection) 5/14    was confused and was found in [place].  Marland Kitchen Acute encephalopathy 09/06/12    was admitted to [facility]  . Foot drop, bilateral   . Shortness of breath     with exertion   . CHF (congestive heart failure)     diurects stopped in April  . DVT (deep venous thrombosis)     01/2012  . Seizures     Myoclonus  . Neuropathy     legs  . Arthritis   . Memory loss of unknown cause   . AVN (avascular necrosis of bone)     right  . Obese   . History of cerebrovascular disease   . Abnormality of gait 08/03/2013  . Cervicogenic headache 10/26/2013   Past Surgical History  Procedure Laterality Date  . Back surgery      4 lumbar  . Hip surgery Left   . Cervical fusion    . Knee arthroscopy Right   . Joint replacement    . Pain pump implantation  Reports no longer has pump  . Tooth extraction N/A 11/16/2012    Procedure: DENTAL EXTRACTIONS OF MULTIPLE NONRESTORABLE TEETH;  Surgeon: Hinton Dyer, DDS;  Location: [facility] OR;  Service: Oral Surgery;  Laterality: N/A;  #12, 14, 15, 22, 23, 24, 25, 26, 27, 28   Family History  Problem Relation Age of Onset  . CAD Father   . Heart disease Father   . Heart failure Mother   . Breast cancer Sister   . CAD Brother    History  Substance Use Topics  . Smoking status: Never Smoker   . Smokeless tobacco: Never Used  . Alcohol Use: Yes  Comment: OCCASSIONAL    Review of Systems  Constitutional: Negative for fever and chills.  Respiratory: Negative.   Cardiovascular: Negative.   Gastrointestinal: Negative.  Negative for nausea, vomiting and abdominal pain.  Musculoskeletal:       See HPI.  Skin: Negative.  Negative for wound.  Neurological: Positive for seizures.  Psychiatric/Behavioral: The patient is not nervous/anxious.       Allergies  Ceftin; Cefuroxime axetil; Iodinated diagnostic agents; Latex; Shellfish-derived products; and Statins  Home Medications   Prior to Admission medications   Medication Sig Start Date End Date Taking? Authorizing Provider  carisoprodol (SOMA) 350 MG tablet TAKE 1 TABLET BY MOUTH 3 TIMES A DAY AS NEEDED FOR MUSCLE SPASM 11/22/14  Yes Historical Provider, MD   Cholecalciferol 1000 UNITS TBDP Take 1 tablet by mouth daily.   Yes Historical Provider, MD  diazepam (VALIUM) 5 MG tablet Take 5 mg by mouth every 6 (six) hours.  12/13/14  Yes Historical Provider, MD  DULoxetine (CYMBALTA) 60 MG capsule TAKE 2 CAPSULES BY MOUTH IN THE MORNING 12/06/14  Yes York Spaniel, MD  levETIRAcetam (KEPPRA) 1000 MG tablet TAKE 1 TABLET BY MOUTH TWICE A DAY 12/06/14  Yes York Spaniel, MD  Multiple Vitamin (MULTIVITAMIN WITH MINERALS) TABS tablet Take 1 tablet by mouth every morning.    Yes Historical Provider, MD  omeprazole (PRILOSEC) 20 MG capsule Take 20 mg by mouth daily as needed (indigestion).    Yes Historical Provider, MD  OPANA ER, CRUSH RESISTANT, 30 MG T12A TAKE 1 TABLET EVERY 8 HOURS 12/02/14  Yes Historical Provider, MD  OVER THE COUNTER MEDICATION Take 2 tablets by mouth at bedtime. somnapure nature sleep aid   Yes Historical Provider, MD  Oxycodone HCl 10 MG TABS Take 10 mg by mouth every 6 (six) hours as needed. for pain 11/28/14  Yes Historical Provider, MD  oxymorphone (OPANA ER) 30 MG 12 hr tablet Take 30 mg by mouth 3 (three) times daily.   Yes Historical Provider, MD  propranolol (INDERAL) 20 MG tablet TAKE 1 TABLET BY MOUTH TWICE A DAY 07/21/14  Yes York Spaniel, MD  temazepam (RESTORIL) 15 MG capsule Take 1 capsule (15 mg total) by mouth at bedtime. Patient not taking: Reported on 12/16/2014 02/15/13   Leatha Gilding, MD   BP 142/84 mmHg  Pulse 99  Temp(Src) 97.9 F (36.6 C) (Oral)  Resp 17  SpO2 94% Physical Exam  Constitutional: He is oriented to person, place, and time. He appears well-developed and well-nourished.  HENT:  Head: Normocephalic.  Neck: Normal range of motion. Neck supple.  Cardiovascular: Normal rate, regular rhythm and intact distal pulses.   Pulmonary/Chest: Effort normal and breath sounds normal. He has no wheezes. He has no rales. He exhibits no tenderness.  Abdominal: Soft. Bowel sounds are normal. There is no  tenderness. There is no rebound and no guarding.  Musculoskeletal: Normal range of motion.  Left leg with large firm swelling to lateral and proximal thigh that is moderately tender. No redness. No calf tenderness. Distal pulses intact.   Neurological: He is alert and oriented to person, place, and time. Coordination normal.  Oriented, preserved coordination. Speech clear and focused. He has no post-ictal symptoms after generalized jerking motion - no confusion, incontinence, tongue biting. ?Seizure activity  Skin: Skin is warm and dry. No rash noted.  Psychiatric: He has a normal mood and affect.    ED Course  Procedures (including critical care time) Labs Review Labs Reviewed -  No data to display  Imaging Review No results found.   EKG Interpretation None      MDM   Final diagnoses:  None    1. Left thigh mass 2. History of seizure disorder  Leg mass appears benign. No redness, warmth to suggest infection. Does not raise concern for DVT. Suggest supportive management.  The patient and his wife are unconcerned about ?seizure activity in the ED. "He has periodic breakthrough seizures." He appears stable from a neurologic standpoint. He reports compliance with Keppra and Valium. He can be discharged home with neurology follow up as scheduled. Recommend PCP recheck of leg swelling.    Leelynn Whetsel UpElpidio AnisC 12/17/14 1023  Arby Barrette, MD 12/21/14 [phone]

## 2014-12-17 MED ORDER — OXYCODONE HCL 10 MG PO TABS: 10.0000 mg | ORAL_TABLET | Freq: Four times a day (QID) | ORAL | Status: DC

## 2014-12-17 MED ORDER — OXYCODONE HCL 5 MG PO TABS: 10.0000 mg | ORAL_TABLET | ORAL | Status: DC | PRN

## 2014-12-18 ENCOUNTER — Other Ambulatory Visit: Payer: Self-pay | Admitting: Neurology

## 2014-12-18 NOTE — Telephone Encounter (Signed)
No showed last appt  

## 2014-12-23 ENCOUNTER — Emergency Department (HOSPITAL_COMMUNITY)
Admission: EM | Admit: 2014-12-23 | Discharge: 2014-12-23 | Disposition: A | Discharge status: Home / Self Care | Payer: Medicare Other | Attending: Emergency Medicine | Admitting: Emergency Medicine

## 2014-12-23 ENCOUNTER — Encounter (HOSPITAL_COMMUNITY): Payer: Self-pay | Admitting: *Deleted

## 2014-12-23 ENCOUNTER — Telehealth: Payer: Self-pay | Admitting: Neurology

## 2014-12-23 DIAGNOSIS — K219 Gastro-esophageal reflux disease without esophagitis: Secondary | ICD-10-CM | POA: Diagnosis not present

## 2014-12-23 DIAGNOSIS — G40909 Epilepsy, unspecified, not intractable, without status epilepticus: Secondary | ICD-10-CM | POA: Insufficient documentation

## 2014-12-23 DIAGNOSIS — F419 Anxiety disorder, unspecified: Secondary | ICD-10-CM | POA: Insufficient documentation

## 2014-12-23 DIAGNOSIS — I509 Heart failure, unspecified: Secondary | ICD-10-CM | POA: Insufficient documentation

## 2014-12-23 DIAGNOSIS — F329 Major depressive disorder, single episode, unspecified: Secondary | ICD-10-CM | POA: Diagnosis not present

## 2014-12-23 DIAGNOSIS — Z86718 Personal history of other venous thrombosis and embolism: Secondary | ICD-10-CM | POA: Diagnosis not present

## 2014-12-23 DIAGNOSIS — Z8659 Personal history of other mental and behavioral disorders: Secondary | ICD-10-CM | POA: Diagnosis not present

## 2014-12-23 DIAGNOSIS — Z91040 Latex allergy status: Secondary | ICD-10-CM | POA: Diagnosis not present

## 2014-12-23 DIAGNOSIS — G8929 Other chronic pain: Secondary | ICD-10-CM | POA: Diagnosis not present

## 2014-12-23 DIAGNOSIS — Z8739 Personal history of other diseases of the musculoskeletal system and connective tissue: Secondary | ICD-10-CM | POA: Insufficient documentation

## 2014-12-23 DIAGNOSIS — Z87440 Personal history of urinary (tract) infections: Secondary | ICD-10-CM | POA: Insufficient documentation

## 2014-12-23 DIAGNOSIS — M545 Low back pain: Secondary | ICD-10-CM | POA: Diagnosis present

## 2014-12-23 DIAGNOSIS — Z79899 Other long term (current) drug therapy: Secondary | ICD-10-CM | POA: Diagnosis not present

## 2014-12-23 LAB — COMPREHENSIVE METABOLIC PANEL
ALT: 21 U/L (ref 17–63)
AST: 29 U/L (ref 15–41)
Albumin: 3.9 g/dL (ref 3.5–5.0)
Alkaline Phosphatase: 97 U/L (ref 38–126)
Anion gap: 8 (ref 5–15)
BILIRUBIN TOTAL: 1.1 mg/dL (ref 0.3–1.2)
BUN: 7 mg/dL (ref 6–20)
CO2: 28 mmol/L (ref 22–32)
Calcium: 9.6 mg/dL (ref 8.9–10.3)
Chloride: 105 mmol/L (ref 101–111)
Creatinine, Ser: 0.7 mg/dL (ref 0.61–1.24)
GFR calc Af Amer: >60 mL/min (ref >60)
Glucose, Bld: 127 mg/dL — ABNORMAL HIGH (ref 65–99)
Potassium: 4 mmol/L (ref 3.5–5.1)
Sodium: 141 mmol/L (ref 135–145)
TOTAL PROTEIN: 7.5 g/dL (ref 6.5–8.1)

## 2014-12-23 LAB — CBC WITH DIFFERENTIAL/PLATELET
BASOS ABS: 0 10*3/uL (ref 0.0–0.1)
Basophils Relative: 0 % (ref 0–1)
Eosinophils Absolute: 0 10*3/uL (ref 0.0–0.7)
Eosinophils Relative: 0 % (ref 0–5)
HEMATOCRIT: 44 % (ref 39.0–52.0)
Hemoglobin: 14.9 g/dL (ref 13.0–17.0)
LYMPHS PCT: 24 % (ref 12–46)
Lymphs Abs: 1.9 10*3/uL (ref 0.7–4.0)
MCH: 29.7 pg (ref 26.0–34.0)
MCHC: 33.9 g/dL (ref 30.0–36.0)
MCV: 87.6 fL (ref 78.0–100.0)
Monocytes Absolute: 0.4 10*3/uL (ref 0.1–1.0)
Monocytes Relative: 5 % (ref 3–12)
Neutro Abs: 5.8 10*3/uL (ref 1.7–7.7)
Neutrophils Relative %: 71 % (ref 43–77)
Platelets: 250 10*3/uL (ref 150–400)
RBC: 5.02 MIL/uL (ref 4.22–5.81)
RDW: 13.7 % (ref 11.5–15.5)
WBC: 8.1 10*3/uL (ref 4.0–10.5)

## 2014-12-23 MED ORDER — HYDROMORPHONE HCL 2 MG/ML IJ SOLN
2.0000 mg | Freq: Once | INTRAMUSCULAR | Status: AC
  Administered 2014-12-23: 2 mg via INTRAVENOUS
  Filled 2014-12-23: qty 1

## 2014-12-23 MED ORDER — ONDANSETRON HCL 4 MG/2ML IJ SOLN
4.0000 mg | Freq: Once | INTRAMUSCULAR | Status: AC
  Administered 2014-12-23: 4 mg via INTRAVENOUS
  Filled 2014-12-23: qty 2

## 2014-12-23 NOTE — Telephone Encounter (Signed)
Patient is calling as he was in the hospital over the weekend and had a seizuzre that lasted for more than an hour. They also found a mass in his outer left thigh.  He is very sick this morning and stated that he is going back to the emergency room and be admitted to [facility] as he is not able to come into the office. He wanted Dr. Anne Hahn to know about his illness.  Thanks!

## 2014-12-23 NOTE — ED Notes (Signed)
Patient made aware that we need urine sample.  According to patient we will need to do I&O cath because he is unable to void.  (Apparently patient was here last week and had to have I&O cath for urine sample

## 2014-12-23 NOTE — Discharge Instructions (Signed)

## 2014-12-23 NOTE — ED Notes (Signed)
Bed: WU98 Expected date:  Expected time:  Means of arrival:  Comments: EMS- 63yo, seizure?

## 2014-12-23 NOTE — ED Notes (Addendum)
Advised patient that I spoke with Dr. Blinda Leatherwood.  Dr. Blinda Leatherwood would like to review patient's labs before ordering another dose of pain medication.
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
## 2014-12-23 NOTE — Telephone Encounter (Signed)
Events noted. The patient has been noted to have nonepileptic seizure-type events previously. The patient has gone to the emergency room.

## 2014-12-23 NOTE — Telephone Encounter (Signed)
I tried to call the patient. Unable to reach. There is an ED note for today's date.

## 2014-12-23 NOTE — ED Provider Notes (Signed)
CSN: [id number]     Arrival date & time 12/23/14  8295 History   First MD Initiated Contact with Patient 12/23/14 [phone]     No chief complaint on file.    (Consider location/radiation/quality/duration/timing/severity/associated sxs/prior Treatment) HPI Comments: Patient presents to the emergency department for evaluation of possible seizure. Patient reports he has had at least 2 episodes today that he thinks might have been a seizure. He does report a previous history of seizures and stroke. He has a history of chronic pain. Patient reports constant pain in his lower back that is unchanged from his baseline. He reports that he had workers in his home 2 weeks ago and found that half of his Opana prescription was gone shortly after that. He is in the pain clinic, is unable to get more pain medication until August 24.   Past Medical History  Diagnosis Date  . Hyperlipemia   . Chronic back pain   . GERD (gastroesophageal reflux disease)   . Anxiety   . Chronic narcotic dependence   . Hypothyroidism   . Depression   . Myoclonus   . UTI (lower urinary tract infection) 5/14    was confused and was found in [place].  Marland Kitchen Acute encephalopathy 09/06/12    was admitted to [facility]  . Foot drop, bilateral   . Shortness of breath     with exertion  . CHF (congestive heart failure)     diurects stopped in April  . DVT (deep venous thrombosis)     01/2012  . Seizures     Myoclonus  . Neuropathy     legs  . Arthritis   . Memory loss of unknown cause   . AVN (avascular necrosis of bone)     right  . Obese   . History of cerebrovascular disease   . Abnormality of gait 08/03/2013  . Cervicogenic headache 10/26/2013   Past Surgical History  Procedure Laterality Date  . Back surgery      4 lumbar  . Hip surgery Left   . Cervical fusion    . Knee arthroscopy Right   . Joint replacement    . Pain pump implantation  Reports no longer has pump  . Tooth extraction N/A 11/16/2012    Procedure: DENTAL  EXTRACTIONS OF MULTIPLE NONRESTORABLE TEETH;  Surgeon: Hinton Dyer, DDS;  Location: [facility] OR;  Service: Oral Surgery;  Laterality: N/A;  #12, 14, 15, 22, 23, 24, 25, 26, 27, 28   Family History  Problem Relation Age of Onset  . CAD Father   . Heart disease Father   . Heart failure Mother   . Breast cancer Sister   . CAD Brother    Social History  Substance Use Topics  . Smoking status: Never Smoker   . Smokeless tobacco: Never Used  . Alcohol Use: Yes     Comment: OCCASSIONAL    Review of Systems  Musculoskeletal: Positive for back pain.  Neurological: Positive for seizures.  All other systems reviewed and are negative.     Allergies  Iodinated diagnostic agents; Latex; Statins; and Ceftin  Home Medications   Prior to Admission medications   Medication Sig Start Date End Date Taking? Authorizing Provider  DULoxetine (CYMBALTA) 60 MG capsule TAKE 2 CAPSULES BY MOUTH EVERY MORNING 12/19/14  Yes York Spaniel, MD  levETIRAcetam (KEPPRA) 1000 MG tablet TAKE 1 TABLET BY MOUTH TWICE A DAY 12/19/14  Yes York Spaniel, MD  Oxycodone HCl 10 MG TABS Take  1 tablet (10 mg total) by mouth every 6 (six) hours. 12/17/14  Yes Elpidio Anis, PA-C  carisoprodol (SOMA) 350 MG tablet TAKE 1 TABLET BY MOUTH 3 TIMES A DAY AS NEEDED FOR MUSCLE SPASM 11/22/14   Historical Provider, MD  Cholecalciferol 1000 UNITS TBDP Take 1 tablet by mouth daily.    Historical Provider, MD  diazepam (VALIUM) 5 MG tablet Take 5 mg by mouth every 6 (six) hours.  12/13/14   Historical Provider, MD  Multiple Vitamin (MULTIVITAMIN WITH MINERALS) TABS tablet Take 1 tablet by mouth every morning.     Historical Provider, MD  omeprazole (PRILOSEC) 20 MG capsule Take 20 mg by mouth daily as needed (indigestion).     Historical Provider, MD  OPANA ER, CRUSH RESISTANT, 30 MG T12A TAKE 1 TABLET EVERY 8 HOURS 12/02/14   Historical Provider, MD  OVER THE COUNTER MEDICATION Take 2 tablets by mouth at bedtime. somnapure nature  sleep aid    Historical Provider, MD  oxymorphone (OPANA ER) 30 MG 12 hr tablet Take 30 mg by mouth 3 (three) times daily.    Historical Provider, MD  propranolol (INDERAL) 20 MG tablet TAKE 1 TABLET BY MOUTH TWICE A DAY 07/21/14   York Spaniel, MD  temazepam (RESTORIL) 15 MG capsule Take 1 capsule (15 mg total) by mouth at bedtime. Patient not taking: Reported on 12/16/2014 02/15/13   Leatha Gilding, MD   BP 149/98 mmHg  Pulse 95  Temp(Src) 98.4 F (36.9 C) (Oral)  Resp 11  SpO2 97% Physical Exam  Constitutional: He is oriented to person, place, and time. He appears well-developed and well-nourished. No distress.  HENT:  Head: Normocephalic and atraumatic.  Right Ear: Hearing normal.  Left Ear: Hearing normal.  Nose: Nose normal.  Mouth/Throat: Oropharynx is clear and moist and mucous membranes are normal.  Eyes: Conjunctivae and EOM are normal. Pupils are equal, round, and reactive to light.  Neck: Normal range of motion. Neck supple.  Cardiovascular: Regular rhythm, S1 normal and S2 normal.  Exam reveals no gallop and no friction rub.   No murmur heard. Pulmonary/Chest: Effort normal and breath sounds normal. No respiratory distress. He exhibits no tenderness.  Abdominal: Soft. Normal appearance and bowel sounds are normal. There is no hepatosplenomegaly. There is no tenderness. There is no rebound, no guarding, no tenderness at McBurney's point and negative Murphy's sign. No hernia.  Musculoskeletal: Normal range of motion.  Neurological: He is alert and oriented to person, place, and time. He has normal strength. No cranial nerve deficit or sensory deficit. Coordination normal. GCS eye subscore is 4. GCS verbal subscore is 5. GCS motor subscore is 6.  Skin: Skin is warm, dry and intact. No rash noted. No cyanosis.  Psychiatric: He has a normal mood and affect. His speech is normal and behavior is normal. Thought content normal.  Nursing note and vitals reviewed.   ED Course   Procedures (including critical care time) Labs Review Labs Reviewed - No data to display  Imaging Review No results found. I, Tandy Lewin J., personally reviewed and evaluated these images and lab results as part of my medical decision-making.   EKG Interpretation None      MDM   Final diagnoses:  None   chronic pain exacerbation  Patient presents to the emergency department for evaluation of possible seizure. Patient reports that he does have a history of seizures, was brought to the ER by ambulance. EMS report that he had periods where he  would not respond to the shaking, but then would awaken to voice and there was no postictal state. He has not had any of these episodes here in the ER. It is felt that this was not consistent with seizure. Patient reports that he had his Opana "1". He has been weaning out of his dosing and I suspect that he is experiencing exacerbation of his chronic pain with some withdrawal symptoms. Patient administered IV pain medication here in the ER but was told he would not get any additional prescriptions. He needs to follow-up with his pain management specialist.    Gilda Crease, MD 12/23/14 [phone]

## 2015-01-01 ENCOUNTER — Other Ambulatory Visit: Payer: Self-pay | Admitting: Neurology

## 2015-01-06 ENCOUNTER — Ambulatory Visit (INDEPENDENT_AMBULATORY_CARE_PROVIDER_SITE_OTHER): Payer: Medicare Other | Admitting: Neurology

## 2015-01-06 ENCOUNTER — Encounter: Payer: Self-pay | Admitting: Neurology

## 2015-01-06 VITALS — BP 155/98 | HR 109 | Ht 69.0 in | Wt 265.0 lb

## 2015-01-06 DIAGNOSIS — R569 Unspecified convulsions: Secondary | ICD-10-CM

## 2015-01-06 DIAGNOSIS — R51 Headache: Secondary | ICD-10-CM | POA: Diagnosis not present

## 2015-01-06 DIAGNOSIS — G4486 Cervicogenic headache: Secondary | ICD-10-CM

## 2015-01-06 DIAGNOSIS — R269 Unspecified abnormalities of gait and mobility: Secondary | ICD-10-CM | POA: Diagnosis not present

## 2015-01-06 DIAGNOSIS — E538 Deficiency of other specified B group vitamins: Secondary | ICD-10-CM | POA: Diagnosis not present

## 2015-01-06 MED ORDER — LEVETIRACETAM 1000 MG PO TABS: 1000.0000 mg | ORAL_TABLET | Freq: Two times a day (BID) | ORAL | Status: DC

## 2015-01-06 MED ORDER — DULOXETINE HCL 60 MG PO CPEP: 120.0000 mg | ORAL_CAPSULE | Freq: Every morning | ORAL | Status: AC

## 2015-01-06 MED ORDER — PROPRANOLOL HCL 20 MG PO TABS: 20.0000 mg | ORAL_TABLET | Freq: Two times a day (BID) | ORAL | Status: DC

## 2015-01-06 NOTE — Progress Notes (Signed)
Reason for visit: Seizures  Alex Mcintosh is an 64 y.o. male  History of present illness:  Mr. Alex Mcintosh is a 64 year old right-handed white male with a history of a chronic seizure disorder with MRI evidence of moderate right and severe left perisylvian atrophy. The patient has episodes of seizures with loss of consciousness, but he also has events of left-sided jerking without loss of consciousness. The patient has had EEG evaluation done last year, he had 2 events of left-sided jerking that revealed nonepileptic events. 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 He has a chronic gait disorder, indicates that his ability to ambulate has worsened over the last 2 or 3 months. The patient had a CT scan of the brain done in June 2016 that showed no acute changes. The patient has a tendency to fall backwards, even while using his walker. The patient does have chronic left leg discomfort. He is followed through a pain center for this, taking oral pain medications. He is limited in how long he can walk because of pain. He returns to this office for an evaluation. The patient has been to the emergency room recently on 2 occasions, on August 8 and then on August 15. The patient ran out of his pain medications prior to August 15, he had an episode of left-sided jerking while in the emergency room without loss of consciousness. The ER visit on August 8 was for problems with balance and falls.   Past Medical History  Diagnosis Date  . Hyperlipemia   . Chronic back pain   . GERD (gastroesophageal reflux disease)   . Anxiety   . Chronic narcotic dependence   . Hypothyroidism   . Depression   . Myoclonus   . UTI (lower urinary tract infection) 5/14    was confused and was found in [place].  Marland Kitchen Acute encephalopathy 09/06/12    was admitted to [facility]  . Foot drop, bilateral   . Shortness of breath     with exertion  . CHF (congestive heart failure)     diurects stopped in April  . DVT (deep venous  thrombosis)     01/2012  . Seizures     Myoclonus  . Neuropathy     legs  . Arthritis   . Memory loss of unknown cause   . AVN (avascular necrosis of bone)     right  . Obese   . History of cerebrovascular disease   . Abnormality of gait 08/03/2013  . Cervicogenic headache 10/26/2013    Past Surgical History  Procedure Laterality Date  . Back surgery      4 lumbar  . Hip surgery Left   . Cervical fusion    . Knee arthroscopy Right   . Joint replacement    . Pain pump implantation  Reports no longer has pump  . Tooth extraction N/A 11/16/2012    Procedure: DENTAL EXTRACTIONS OF MULTIPLE NONRESTORABLE TEETH;  Surgeon: Hinton Dyer, DDS;  Location: [facility] OR;  Service: Oral Surgery;  Laterality: N/A;  #12, 14, 15, 22, 23, 24, 25, 26, 27, 28    Family History  Problem Relation Age of Onset  . CAD Father   . Heart disease Father   . Heart failure Mother   . Breast cancer Sister   . CAD Brother     Social history:  reports that he has never smoked. He has never used smokeless tobacco. He reports that he does not drink alcohol  or use illicit drugs.    Allergies  Allergen Reactions  . Iodinated Diagnostic Agents Other (See Comments)    Patient does not recollect  . Latex Other (See Comments)    Patient does not recollect  . Statins Other (See Comments)    Causes elevation of liver enzymes  . Ceftin [Cefuroxime Axetil] Hives and Rash    Medications:  Prior to Admission medications   Medication Sig Start Date End Date Taking? Authorizing Provider  BLACK COHOSH PO Take 1 tablet by mouth at bedtime.   Yes Historical Provider, MD  carisoprodol (SOMA) 350 MG tablet TAKE 1 TABLET BY MOUTH 3 TIMES A DAY 11/22/14  Yes Historical Provider, MD  Cholecalciferol 1000 UNITS TBDP Take 1 tablet by mouth daily.   Yes Historical Provider, MD  diazepam (VALIUM) 5 MG tablet Take 5 mg by mouth every 6 (six) hours.  12/13/14  Yes Historical Provider, MD  DULoxetine (CYMBALTA) 60 MG capsule Take  2 capsules (120 mg total) by mouth every morning. 01/06/15  Yes York Spaniel, MD  levETIRAcetam (KEPPRA) 1000 MG tablet Take 1 tablet (1,000 mg total) by mouth 2 (two) times daily. 01/06/15  Yes York Spaniel, MD  Multiple Vitamin (MULTIVITAMIN WITH MINERALS) TABS tablet Take 1 tablet by mouth every morning.    Yes Historical Provider, MD  OVER THE COUNTER MEDICATION Take 1 capsule by mouth daily. Patient is unsure. Likely Vit. E. Unknown strength.   Yes Historical Provider, MD  Oxycodone HCl 10 MG TABS Take 1 tablet (10 mg total) by mouth every 6 (six) hours. 12/17/14  Yes Elpidio Anis, PA-C  oxymorphone (OPANA ER) 30 MG 12 hr tablet Take 30 mg by mouth 3 (three) times daily.   Yes Historical Provider, MD  propranolol (INDERAL) 20 MG tablet Take 1 tablet (20 mg total) by mouth 2 (two) times daily. 01/06/15  Yes York Spaniel, MD    ROS:  Out of a complete 14 system review of symptoms, the patient complains only of the following symptoms, and all other reviewed systems are negative.  Decreased activity, appetite change, fatigue  Light sensitivity Gait disorder   Blood pressure 155/98, pulse 109, height 5\' 9"  (1.753 m), weight 265 lb (120.203 kg).  Physical Exam  General: The patient is alert and cooperative at the time of the examination. The patient is moderately obese.   Skin: No significant peripheral edema is noted.   Neurologic Exam  Mental status: The patient is alert and oriented x 3 at the time of the examination. The patient has apparent normal recent and remote memory, with an apparently normal attention span and concentration ability.   Cranial nerves: Facial symmetry is present. Speech is normal, no aphasia or dysarthria is noted. Extraocular movements are full. Visual fields are full.  Motor: The patient has good strength in all 4 extremities.  Sensory examination: Soft touch sensation is symmetric on the face, with decreased soft touch sensation on the left arm  and left leg. Vibration sensation is symmetric on the face and legs, decreased on the left arm.  Coordination: The patient has good finger-nose-finger and heel-to-shin bilaterally.  Gait and station: The patient has a wide-based, slow deliberate gait. The patient walks with a walker. Tandem gait was not attempted.   Reflexes: Deep tendon reflexes are symmetric, but are depressed.   Assessment/Plan:  1. History of seizures  2. Pseudoseizures  3. Chronic gait disorder, reported worsening  4. Chronic pain syndrome  The patient reports some  worsening of balance, he is falling more frequently. He will be sent for blood work today to evaluate for metabolic etiologies of gait problems. The patient will be sent for physical therapy for gait training. He will continue the Keppra at 1000 mg twice daily. He was given a prescription for the Keppra, propranolol, and for the Cymbalta. He will follow-up in 6 months, sooner if needed.   Marlan Palau MD 01/06/2015 3:00 PM  [facility] [address] [place], [place] [address]  Phone [phone] Fax [phone]

## 2015-01-06 NOTE — Patient Instructions (Addendum)
We will set you up for physical therapy for gait and balance training.   Fall Prevention and Home Safety Falls cause injuries and can affect all age groups. It is possible to use preventive measures to significantly decrease the likelihood of falls. There are many simple measures which can make your home safer and prevent falls. OUTDOORS  Repair cracks and edges of walkways and driveways.  Remove high doorway thresholds.  Trim shrubbery on the main path into your home.  Have good outside lighting.  Clear walkways of tools, rocks, debris, and clutter.  Check that handrails are not broken and are securely fastened. Both sides of steps should have handrails.  Have leaves, snow, and ice cleared regularly.  Use sand or salt on walkways during winter months.  In the garage, clean up grease or oil spills. BATHROOM  Install night lights.  Install grab bars by the toilet and in the tub and shower.  Use non-skid mats or decals in the tub or shower.  Place a plastic non-slip stool in the shower to sit on, if needed.  Keep floors dry and clean up all water on the floor immediately.  Remove soap buildup in the tub or shower on a regular basis.  Secure bath mats with non-slip, double-sided rug tape.  Remove throw rugs and tripping hazards from the floors. BEDROOMS  Install night lights.  Make sure a bedside light is easy to reach.  Do not use oversized bedding.  Keep a telephone by your bedside.  Have a firm chair with side arms to use for getting dressed.  Remove throw rugs and tripping hazards from the floor. KITCHEN  Keep handles on pots and pans turned toward the center of the stove. Use back burners when possible.  Clean up spills quickly and allow time for drying.  Avoid walking on wet floors.  Avoid hot utensils and knives.  Position shelves so they are not too high or low.  Place commonly used objects within easy reach.  If necessary, use a sturdy step  stool with a grab bar when reaching.  Keep electrical cables out of the way.  Do not use floor polish or wax that makes floors slippery. If you must use wax, use non-skid floor wax.  Remove throw rugs and tripping hazards from the floor. STAIRWAYS  Never leave objects on stairs.  Place handrails on both sides of stairways and use them. Fix any loose handrails. Make sure handrails on both sides of the stairways are as long as the stairs.  Check carpeting to make sure it is firmly attached along stairs. Make repairs to worn or loose carpet promptly.  Avoid placing throw rugs at the top or bottom of stairways, or properly secure the rug with carpet tape to prevent slippage. Get rid of throw rugs, if possible.  Have an electrician put in a light switch at the top and bottom of the stairs. OTHER FALL PREVENTION TIPS  Wear low-heel or rubber-soled shoes that are supportive and fit well. Wear closed toe shoes.  When using a stepladder, make sure it is fully opened and both spreaders are firmly locked. Do not climb a closed stepladder.  Add color or contrast paint or tape to grab bars and handrails in your home. Place contrasting color strips on first and last steps.  Learn and use mobility aids as needed. Install an electrical emergency response system.  Turn on lights to avoid dark areas. Replace light bulbs that burn out immediately. Get light  switches that glow.  Arrange furniture to create clear pathways. Keep furniture in the same place.  Firmly attach carpet with non-skid or double-sided tape.  Eliminate uneven floor surfaces.  Select a carpet pattern that does not visually hide the edge of steps.  Be aware of all pets. OTHER HOME SAFETY TIPS  Set the water temperature for 120 F (48.8 C).  Keep emergency numbers on or near the telephone.  Keep smoke detectors on every level of the home and near sleeping areas. Document Released: 04/16/2002 Document Revised: 10/26/2011  Document Reviewed: 07/16/2011 [facility] Patient Information 2015 [place], [place]. This information is not intended to replace advice given to you by your health care provider. Make sure you discuss any questions you have with your health care provider.

## 2015-01-08 LAB — COPPER, SERUM: Copper: 122 ug/dL (ref 72–166)

## 2015-01-08 LAB — RPR: RPR Ser Ql: NONREACTIVE

## 2015-01-08 LAB — VITAMIN B12: Vitamin B-12: 438 pg/mL (ref 211–946)

## 2015-01-31 ENCOUNTER — Other Ambulatory Visit: Payer: Self-pay | Admitting: Neurology

## 2015-03-22 ENCOUNTER — Emergency Department (HOSPITAL_COMMUNITY): Payer: Medicare Other

## 2015-03-22 ENCOUNTER — Encounter (HOSPITAL_COMMUNITY): Payer: Self-pay | Admitting: Emergency Medicine

## 2015-03-22 ENCOUNTER — Emergency Department (HOSPITAL_COMMUNITY)
Admission: EM | Admit: 2015-03-22 | Discharge: 2015-03-22 | Disposition: A | Discharge status: Home / Self Care | Payer: Medicare Other | Attending: Emergency Medicine | Admitting: Emergency Medicine

## 2015-03-22 DIAGNOSIS — Y998 Other external cause status: Secondary | ICD-10-CM | POA: Insufficient documentation

## 2015-03-22 DIAGNOSIS — Y9289 Other specified places as the place of occurrence of the external cause: Secondary | ICD-10-CM | POA: Diagnosis not present

## 2015-03-22 DIAGNOSIS — S299XXA Unspecified injury of thorax, initial encounter: Secondary | ICD-10-CM | POA: Insufficient documentation

## 2015-03-22 DIAGNOSIS — S199XXA Unspecified injury of neck, initial encounter: Secondary | ICD-10-CM | POA: Insufficient documentation

## 2015-03-22 DIAGNOSIS — Z79899 Other long term (current) drug therapy: Secondary | ICD-10-CM | POA: Diagnosis not present

## 2015-03-22 DIAGNOSIS — F419 Anxiety disorder, unspecified: Secondary | ICD-10-CM | POA: Insufficient documentation

## 2015-03-22 DIAGNOSIS — S0990XA Unspecified injury of head, initial encounter: Secondary | ICD-10-CM | POA: Insufficient documentation

## 2015-03-22 DIAGNOSIS — S7001XA Contusion of right hip, initial encounter: Secondary | ICD-10-CM | POA: Diagnosis not present

## 2015-03-22 DIAGNOSIS — F329 Major depressive disorder, single episode, unspecified: Secondary | ICD-10-CM | POA: Diagnosis not present

## 2015-03-22 DIAGNOSIS — W19XXXA Unspecified fall, initial encounter: Secondary | ICD-10-CM

## 2015-03-22 DIAGNOSIS — I509 Heart failure, unspecified: Secondary | ICD-10-CM | POA: Diagnosis not present

## 2015-03-22 DIAGNOSIS — Z87440 Personal history of urinary (tract) infections: Secondary | ICD-10-CM | POA: Diagnosis not present

## 2015-03-22 DIAGNOSIS — Z91040 Latex allergy status: Secondary | ICD-10-CM | POA: Insufficient documentation

## 2015-03-22 DIAGNOSIS — Z86718 Personal history of other venous thrombosis and embolism: Secondary | ICD-10-CM | POA: Insufficient documentation

## 2015-03-22 DIAGNOSIS — M1990 Unspecified osteoarthritis, unspecified site: Secondary | ICD-10-CM | POA: Diagnosis not present

## 2015-03-22 DIAGNOSIS — Y9389 Activity, other specified: Secondary | ICD-10-CM | POA: Insufficient documentation

## 2015-03-22 DIAGNOSIS — G40909 Epilepsy, unspecified, not intractable, without status epilepticus: Secondary | ICD-10-CM | POA: Diagnosis not present

## 2015-03-22 DIAGNOSIS — W1839XA Other fall on same level, initial encounter: Secondary | ICD-10-CM | POA: Diagnosis not present

## 2015-03-22 DIAGNOSIS — G8929 Other chronic pain: Secondary | ICD-10-CM | POA: Insufficient documentation

## 2015-03-22 DIAGNOSIS — E669 Obesity, unspecified: Secondary | ICD-10-CM | POA: Insufficient documentation

## 2015-03-22 DIAGNOSIS — R55 Syncope and collapse: Secondary | ICD-10-CM | POA: Insufficient documentation

## 2015-03-22 DIAGNOSIS — Z8719 Personal history of other diseases of the digestive system: Secondary | ICD-10-CM | POA: Diagnosis not present

## 2015-03-22 DIAGNOSIS — S79911A Unspecified injury of right hip, initial encounter: Secondary | ICD-10-CM | POA: Diagnosis present

## 2015-03-22 DIAGNOSIS — G629 Polyneuropathy, unspecified: Secondary | ICD-10-CM | POA: Diagnosis not present

## 2015-03-22 LAB — CBC WITH DIFFERENTIAL/PLATELET
BASOS ABS: 0 10*3/uL (ref 0.0–0.1)
BASOS PCT: 0 %
EOS ABS: 0 10*3/uL (ref 0.0–0.7)
EOS PCT: 0 %
HCT: 44.6 % (ref 39.0–52.0)
HEMOGLOBIN: 15.4 g/dL (ref 13.0–17.0)
LYMPHS ABS: 2.2 10*3/uL (ref 0.7–4.0)
Lymphocytes Relative: 19 %
MCH: 29.9 pg (ref 26.0–34.0)
MCHC: 34.5 g/dL (ref 30.0–36.0)
MCV: 86.6 fL (ref 78.0–100.0)
Monocytes Absolute: 0.8 10*3/uL (ref 0.1–1.0)
Monocytes Relative: 7 %
NEUTROS PCT: 74 %
Neutro Abs: 8.9 10*3/uL — ABNORMAL HIGH (ref 1.7–7.7)
PLATELETS: 245 10*3/uL (ref 150–400)
RBC: 5.15 MIL/uL (ref 4.22–5.81)
RDW: 12.5 % (ref 11.5–15.5)
WBC: 11.9 10*3/uL — AB (ref 4.0–10.5)

## 2015-03-22 LAB — BASIC METABOLIC PANEL
Anion gap: 7 (ref 5–15)
BUN: 8 mg/dL (ref 6–20)
CALCIUM: 9.6 mg/dL (ref 8.9–10.3)
CO2: 29 mmol/L (ref 22–32)
CREATININE: 0.79 mg/dL (ref 0.61–1.24)
Chloride: 106 mmol/L (ref 101–111)
Glucose, Bld: 127 mg/dL — ABNORMAL HIGH (ref 65–99)
Potassium: 4 mmol/L (ref 3.5–5.1)
SODIUM: 142 mmol/L (ref 135–145)

## 2015-03-22 MED ORDER — HYDROMORPHONE HCL 1 MG/ML IJ SOLN
1.0000 mg | Freq: Once | INTRAMUSCULAR | Status: AC
  Administered 2015-03-22: 1 mg via INTRAVENOUS
  Filled 2015-03-22: qty 1

## 2015-03-22 NOTE — ED Notes (Signed)
Pt. On monitor. 

## 2015-03-22 NOTE — ED Notes (Signed)
Rn is going to start IV for meds, will draw labs

## 2015-03-22 NOTE — ED Notes (Signed)
Pt from home via EMS after fall-Per EMS, pt sts that he has had several falls over the past few weeks. Today, pt fell face down on carpeted floor. Pt denies hitting head but reports that he had LOC. Pt c/o chronic neck, back and bilateral hip pain. Pt is under neurological care for frequent falls. Pt has hx of multiple back and neck surgeries with fusions. Pt is A&O and in NAD

## 2015-03-22 NOTE — ED Notes (Signed)
Bed: [id number] Expected date: 03/22/15 Expected time: 4:16 PM Means of arrival: Ambulance Comments: Fall

## 2015-03-22 NOTE — ED Provider Notes (Signed)
CSN: [id number]     Arrival date & time 03/22/15  1614 History   First MD Initiated Contact with Patient 03/22/15 1622     Chief Complaint  Patient presents with  . Fall  . Neck Pain  . Back Pain  . Hip Pain     (Consider location/radiation/quality/duration/timing/severity/associated sxs/prior Treatment) HPI  64 year old male presents after a fall. He states he does not remember the fall. He has a chronic gait issue for which he falls a lot. Patient apparently fell face down onto a carpeted floor today. Does not think he hit his head but thinks he lost consciousness. However he states he cannot really remember, which is due to a neurologic problem that has not been completely diagnosed. He does follow with neurology as an outpatient. The patient is reporting anterior chest pain since the fall and is tender there. Also reports chronic neck and back pain that does not feel particularly worse than typical. Sees chronic pain on an outpatient basis. He has bilateral hip pain, right greater than left since the fall. His left hip has been replaced. Patient denies any new weakness or numbness.    Past Medical History  Diagnosis Date  . Hyperlipemia   . Chronic back pain   . GERD (gastroesophageal reflux disease)   . Anxiety   . Chronic narcotic dependence (HCC)   . Hypothyroidism   . Depression   . Myoclonus   . UTI (lower urinary tract infection) 5/14    was confused and was found in [place].  Marland Kitchen Acute encephalopathy 09/06/12    was admitted to [facility]  . Foot drop, bilateral   . Shortness of breath     with exertion  . CHF (congestive heart failure) (HCC)     diurects stopped in April  . DVT (deep venous thrombosis) (HCC)     01/2012  . Seizures (HCC)     Myoclonus  . Neuropathy (HCC)     legs  . Arthritis   . Memory loss of unknown cause   . AVN (avascular necrosis of bone) (HCC)     right  . Obese   . History of cerebrovascular disease   . Abnormality of gait 08/03/2013  .  Cervicogenic headache 10/26/2013   Past Surgical History  Procedure Laterality Date  . Back surgery      4 lumbar  . Hip surgery Left   . Cervical fusion    . Knee arthroscopy Right   . Joint replacement    . Pain pump implantation  Reports no longer has pump  . Tooth extraction N/A 11/16/2012    Procedure: DENTAL EXTRACTIONS OF MULTIPLE NONRESTORABLE TEETH;  Surgeon: Hinton Dyer, DDS;  Location: [facility] OR;  Service: Oral Surgery;  Laterality: N/A;  #12, 14, 15, 22, 23, 24, 25, 26, 27, 28   Family History  Problem Relation Age of Onset  . CAD Father   . Heart disease Father   . Heart failure Mother   . Breast cancer Sister   . CAD Brother    Social History  Substance Use Topics  . Smoking status: Never Smoker   . Smokeless tobacco: Never Used  . Alcohol Use: No     Comment: OCCASSIONAL    Review of Systems  Respiratory: Negative for shortness of breath.   Cardiovascular: Positive for chest pain.  Genitourinary: Negative for dysuria.  Musculoskeletal: Positive for back pain and neck pain.  Neurological: Positive for syncope and headaches. Negative for dizziness.  All  other systems reviewed and are negative.     Allergies  Iodinated diagnostic agents; Latex; Statins; and Ceftin  Home Medications   Prior to Admission medications   Medication Sig Start Date End Date Taking? Authorizing Provider  BLACK COHOSH PO Take 1 tablet by mouth at bedtime.    Historical Provider, MD  carisoprodol (SOMA) 350 MG tablet TAKE 1 TABLET BY MOUTH 3 TIMES A DAY 11/22/14   Historical Provider, MD  Cholecalciferol 1000 UNITS TBDP Take 1 tablet by mouth daily.    Historical Provider, MD  diazepam (VALIUM) 5 MG tablet Take 5 mg by mouth every 6 (six) hours.  12/13/14   Historical Provider, MD  DULoxetine (CYMBALTA) 60 MG capsule Take 2 capsules (120 mg total) by mouth every morning. 01/06/15   York Spaniel, MD  levETIRAcetam (KEPPRA) 1000 MG tablet Take 1 tablet (1,000 mg total) by mouth 2  (two) times daily. 01/06/15   York Spaniel, MD  Multiple Vitamin (MULTIVITAMIN WITH MINERALS) TABS tablet Take 1 tablet by mouth every morning.     Historical Provider, MD  OVER THE COUNTER MEDICATION Take 1 capsule by mouth daily. Patient is unsure. Likely Vit. E. Unknown strength.    Historical Provider, MD  Oxycodone HCl 10 MG TABS Take 1 tablet (10 mg total) by mouth every 6 (six) hours. 12/17/14   Elpidio Anis, PA-C  oxymorphone (OPANA ER) 30 MG 12 hr tablet Take 30 mg by mouth 3 (three) times daily.    Historical Provider, MD  propranolol (INDERAL) 20 MG tablet Take 1 tablet (20 mg total) by mouth 2 (two) times daily. 01/06/15   York Spaniel, MD   BP 140/82 mmHg  Pulse 72  Temp(Src) 98.2 F (36.8 C) (Oral)  Resp 20  SpO2 95% Physical Exam  Constitutional: He is oriented to person, place, and time. He appears well-developed and well-nourished.  HENT:  Head: Normocephalic and atraumatic.  Right Ear: External ear normal.  Left Ear: External ear normal.  Nose: Nose normal.  Eyes: EOM are normal. Pupils are equal, round, and reactive to light. Right eye exhibits no discharge. Left eye exhibits no discharge.  Neck: Neck supple. Muscular tenderness present.  Cardiovascular: Normal rate, regular rhythm, normal heart sounds and intact distal pulses.   Pulmonary/Chest: Effort normal and breath sounds normal. He exhibits tenderness (mild chest wall tenderness anteriorly).  Abdominal: Soft. He exhibits no distension. There is no tenderness.  Musculoskeletal: He exhibits no edema.       Right hip: He exhibits decreased range of motion (causes pain in back) and tenderness.       Left hip: He exhibits decreased range of motion (causes pain in back).       Cervical back: He exhibits tenderness.       Thoracic back: He exhibits tenderness.       Lumbar back: He exhibits tenderness.  Neurological: He is alert and oriented to person, place, and time.  Skin: Skin is warm and dry.  Nursing  note and vitals reviewed.   ED Course  Procedures (including critical care time) Labs Review Labs Reviewed  BASIC METABOLIC PANEL - Abnormal; Notable for the following:    Glucose, Bld 127 (*)    All other components within normal limits  CBC WITH DIFFERENTIAL/PLATELET - Abnormal; Notable for the following:    WBC 11.9 (*)    Neutro Abs 8.9 (*)    All other components within normal limits    Imaging Review Dg Chest 1  View  03/22/2015  CLINICAL DATA:  Fall today with shortness-of-breath. EXAM: CHEST 1 VIEW COMPARISON:  03/24/2014 FINDINGS: Lungs are adequately inflated without consolidation or effusion. No pneumothorax. Cardiomediastinal silhouette is within normal. Remaining bones and soft tissues are normal. IMPRESSION: No active disease. Electronically Signed   By: Elberta Fortis M.D.   On: 03/22/2015 18:29   Dg Thoracic Spine 2 View  03/22/2015  CLINICAL DATA:  Multiple recent falls with left lower back pain radiating to right hip. EXAM: THORACIC SPINE 2 VIEWS COMPARISON:  03/24/2014 FINDINGS: Vertebral body alignment is within normal. There is mild spondylosis throughout the thoracic spine. There is a stable mild compression deformity over the lower thoracic spine likely T12. Remainder the exam is unchanged. IMPRESSION: No acute findings. Stable mild compression deformity of the approximate T12 vertebral body. Electronically Signed   By: Elberta Fortis M.D.   On: 03/22/2015 17:43   Dg Lumbar Spine Complete  03/22/2015  CLINICAL DATA:  Recurrent falls. Low back pain. History of prior lumbar surgery scratch the history of prior spinal surgery. Initial encounter. EXAM: LUMBAR SPINE - COMPLETE 4+ VIEW COMPARISON:  Plain films lumbar spine 03/24/2014. FINDINGS: Epidural catheter is seen. There is mild convex left scoliosis. Mild, remote T12 compression fracture is unchanged. Vertebral body height is otherwise maintained. L4-5 and L5-S1 degenerative disease, worse at L4-5, is unchanged in  appearance. IMPRESSION: No acute abnormality. Lower lumbar degenerative disease. Electronically Signed   By: Drusilla Kanner M.D.   On: 03/22/2015 17:43   Ct Head Wo Contrast  03/22/2015  CLINICAL DATA:  Multiple falls over the past 2 weeks with a fall today. Neck pain. Initial encounter. EXAM: CT HEAD WITHOUT CONTRAST CT CERVICAL SPINE WITHOUT CONTRAST TECHNIQUE: Multidetector CT imaging of the head and cervical spine was performed following the standard protocol without intravenous contrast. Multiplanar CT image reconstructions of the cervical spine were also generated. COMPARISON:  Head and cervical spine CT scans 10/09/2014. FINDINGS: CT HEAD FINDINGS Cortical atrophy and chronic microvascular ischemic change are identified. No evidence of acute abnormality including hemorrhage, infarct, mass lesion, mass effect, midline shift or abnormal extra-axial fluid collection is identified. Small left mastoid effusion is unchanged. The right mastoid air cells and imaged paranasal sinuses are clear. No pneumocephalus or hydrocephalus. The calvarium is intact. CT CERVICAL SPINE FINDINGS No fracture or malalignment is identified. Loss of disc space height is seen at C4-5 and C5-6. The C6-7 level is fused. Lung apices are clear. IMPRESSION: No acute abnormality head or cervical spine. Stable compared to prior exam. Electronically Signed   By: Drusilla Kanner M.D.   On: 03/22/2015 17:40   Ct Cervical Spine Wo Contrast  03/22/2015  CLINICAL DATA:  Multiple falls over the past 2 weeks with a fall today. Neck pain. Initial encounter. EXAM: CT HEAD WITHOUT CONTRAST CT CERVICAL SPINE WITHOUT CONTRAST TECHNIQUE: Multidetector CT imaging of the head and cervical spine was performed following the standard protocol without intravenous contrast. Multiplanar CT image reconstructions of the cervical spine were also generated. COMPARISON:  Head and cervical spine CT scans 10/09/2014. FINDINGS: CT HEAD FINDINGS Cortical atrophy  and chronic microvascular ischemic change are identified. No evidence of acute abnormality including hemorrhage, infarct, mass lesion, mass effect, midline shift or abnormal extra-axial fluid collection is identified. Small left mastoid effusion is unchanged. The right mastoid air cells and imaged paranasal sinuses are clear. No pneumocephalus or hydrocephalus. The calvarium is intact. CT CERVICAL SPINE FINDINGS No fracture or malalignment is identified. Loss of disc  space height is seen at C4-5 and C5-6. The C6-7 level is fused. Lung apices are clear. IMPRESSION: No acute abnormality head or cervical spine. Stable compared to prior exam. Electronically Signed   By: Drusilla Kannerhomas  Dalessio M.D.   On: 03/22/2015 17:40   Dg Hip Unilat With Pelvis 2-3 Views Right  03/22/2015  CLINICAL DATA:  Recurrent falls. Right hip pain. Initial encounter. EXAM: DG HIP (WITH OR WITHOUT PELVIS) 2-3V RIGHT COMPARISON:  None. FINDINGS: No acute bony or joint abnormality is identified. Right hip degenerative disease is seen. Left hip arthroplasty is in place. IMPRESSION: No acute abnormality. Right hip osteoarthritis. Electronically Signed   By: Drusilla Kannerhomas  Dalessio M.D.   On: 03/22/2015 17:41   I have personally reviewed and evaluated these images and lab results as part of my medical decision-making.   EKG Interpretation   Date/Time:  Saturday March 22 2015 17:25:58 EST Ventricular Rate:  70 PR Interval:  163 QRS Duration: 99 QT Interval:  438 QTC Calculation: 473 R Axis:   6 Text Interpretation:  Sinus rhythm Abnormal R-wave progression, early  transition no significant change since Aug 2016 Confirmed by Criss AlvineGOLDSTON  MD,  Katrell Milhorn [phone]([id number]) on 03/22/2015 5:28:23 PM      MDM   Final diagnoses:  Fall, initial encounter  Contusion of right hip, initial encounter    No fractures or otherwise acute significant trauma seen on x-rays and CT scans as above. Patient and related to the bathroom without difficulty and was able to  bear weight on his hip. Patient feels much better. He has a long-standing history of falls that are considered to be neurologic in origin, but no new neuro deficits or concerning findings and his history of physical. Plan to recommend close follow-up with PCP and his neurologist.    Pricilla LovelessScott Arham Symmonds, MD 03/23/15 [phone]

## 2015-03-22 NOTE — ED Notes (Signed)
EKG given to EDP,Goldston,.MD., for review. 

## 2015-03-22 NOTE — ED Notes (Signed)
Pt sts that he has been under the care of pain management for 32 years.

## 2015-04-18 ENCOUNTER — Telehealth: Payer: Self-pay | Admitting: Neurology

## 2015-04-18 DIAGNOSIS — R569 Unspecified convulsions: Secondary | ICD-10-CM

## 2015-04-18 NOTE — Telephone Encounter (Signed)
I called Melissa. I advised that about a year ago someone called and asked about hospice and per Dr. Anne HahnWillis' note, the patient does not have a terminal illness. I advised that according to the last office visits, this has not changed. She verbalized understanding and asked if Dr. Anne HahnWillis would be willing to order a palliative care consult. She states the patient has mentally given up and is ready for hospice. She stated the wife cannot care for him anymore and wants to place him in a facility. His daughter and son do not want him in a facility. Efraim KaufmannMelissa is trying to get all the help she can for the patient's wife in the home. She states palliative care would allow him to continue to get home health and would allow for symptom management while still getting treatment. I advised I would be happy to ask Dr. Anne HahnWillis and that I would call her back later today or Monday.

## 2015-04-18 NOTE — Telephone Encounter (Signed)
I called, and I talked with the patient. I will try to get a referral for palliative care set up.

## 2015-04-18 NOTE — Telephone Encounter (Signed)
Joanne CharsMelissa Welch with [facility] [phone]) called inquiring the status of pt- she wants to know general life expectancy, general questions about pt disease ( she does know he hasn't been seen since 8/16). She is wanting to inform the family and is also inquiring if she could get a hospice referral or at least palliative care. Pt told her yesterday he has surpassed the 30 yr life expentancy that was 1st given to him. If Dr Anne HahnWillis wants him to come in to discuss this, she just needs to know how to instruct the family. She wants to make sure he qualifies for everything he is entitled to.

## 2015-05-06 ENCOUNTER — Encounter (HOSPITAL_COMMUNITY): Payer: Self-pay | Admitting: Emergency Medicine

## 2015-05-06 DIAGNOSIS — E669 Obesity, unspecified: Secondary | ICD-10-CM | POA: Insufficient documentation

## 2015-05-06 DIAGNOSIS — M79605 Pain in left leg: Secondary | ICD-10-CM | POA: Diagnosis present

## 2015-05-06 DIAGNOSIS — Z86718 Personal history of other venous thrombosis and embolism: Secondary | ICD-10-CM | POA: Diagnosis not present

## 2015-05-06 DIAGNOSIS — F329 Major depressive disorder, single episode, unspecified: Secondary | ICD-10-CM | POA: Insufficient documentation

## 2015-05-06 DIAGNOSIS — F419 Anxiety disorder, unspecified: Secondary | ICD-10-CM | POA: Diagnosis not present

## 2015-05-06 DIAGNOSIS — G8929 Other chronic pain: Secondary | ICD-10-CM | POA: Diagnosis not present

## 2015-05-06 DIAGNOSIS — Z8669 Personal history of other diseases of the nervous system and sense organs: Secondary | ICD-10-CM | POA: Diagnosis not present

## 2015-05-06 DIAGNOSIS — Z91040 Latex allergy status: Secondary | ICD-10-CM | POA: Insufficient documentation

## 2015-05-06 DIAGNOSIS — M1990 Unspecified osteoarthritis, unspecified site: Secondary | ICD-10-CM | POA: Insufficient documentation

## 2015-05-06 DIAGNOSIS — E785 Hyperlipidemia, unspecified: Secondary | ICD-10-CM | POA: Insufficient documentation

## 2015-05-06 DIAGNOSIS — Z79899 Other long term (current) drug therapy: Secondary | ICD-10-CM | POA: Diagnosis not present

## 2015-05-06 DIAGNOSIS — I509 Heart failure, unspecified: Secondary | ICD-10-CM | POA: Insufficient documentation

## 2015-05-06 DIAGNOSIS — Z87440 Personal history of urinary (tract) infections: Secondary | ICD-10-CM | POA: Diagnosis not present

## 2015-05-06 DIAGNOSIS — Z8719 Personal history of other diseases of the digestive system: Secondary | ICD-10-CM | POA: Insufficient documentation

## 2015-05-06 NOTE — ED Notes (Signed)
Pt. reports worsening chronic bilateral leg pain for several years unrelieved by prescription medications , family is concerned that he is taking extra pain medications , pt. admitted taking 2 extra pills of Ativan this evening . Alert and oriented at arrival / respirations unlabored.

## 2015-05-07 ENCOUNTER — Emergency Department (HOSPITAL_COMMUNITY)
Admission: EM | Admit: 2015-05-07 | Discharge: 2015-05-07 | Disposition: A | Discharge status: Home / Self Care | Payer: Medicare Other | Attending: Emergency Medicine | Admitting: Emergency Medicine

## 2015-05-07 ENCOUNTER — Telehealth: Payer: Self-pay

## 2015-05-07 ENCOUNTER — Telehealth: Payer: Self-pay | Admitting: *Deleted

## 2015-05-07 DIAGNOSIS — G8929 Other chronic pain: Secondary | ICD-10-CM

## 2015-05-07 MED ORDER — LEVETIRACETAM 500 MG PO TABS
1000.0000 mg | ORAL_TABLET | Freq: Two times a day (BID) | ORAL | Status: DC
  Administered 2015-05-07: 1000 mg via ORAL
  Filled 2015-05-07 (×3): qty 2

## 2015-05-07 MED ORDER — PROPRANOLOL HCL 20 MG PO TABS
20.0000 mg | ORAL_TABLET | Freq: Two times a day (BID) | ORAL | Status: DC
  Administered 2015-05-07: 20 mg via ORAL
  Filled 2015-05-07 (×3): qty 1

## 2015-05-07 MED ORDER — MORPHINE SULFATE 15 MG PO TABS
30.0000 mg | ORAL_TABLET | Freq: Four times a day (QID) | ORAL | Status: DC | PRN
  Administered 2015-05-07: 30 mg via ORAL
  Filled 2015-05-07: qty 2

## 2015-05-07 MED ORDER — MORPHINE SULFATE ER 30 MG PO TBCR
60.0000 mg | EXTENDED_RELEASE_TABLET | Freq: Two times a day (BID) | ORAL | Status: DC
  Administered 2015-05-07: 60 mg via ORAL
  Filled 2015-05-07: qty 2

## 2015-05-07 MED ORDER — CARISOPRODOL 350 MG PO TABS
350.0000 mg | ORAL_TABLET | Freq: Three times a day (TID) | ORAL | Status: DC
  Administered 2015-05-07: 350 mg via ORAL
  Filled 2015-05-07: qty 1

## 2015-05-07 MED ORDER — DULOXETINE HCL 60 MG PO CPEP
120.0000 mg | ORAL_CAPSULE | Freq: Every morning | ORAL | Status: DC
  Administered 2015-05-07: 120 mg via ORAL
  Filled 2015-05-07 (×2): qty 2

## 2015-05-07 MED ORDER — DIAZEPAM 5 MG PO TABS
5.0000 mg | ORAL_TABLET | Freq: Four times a day (QID) | ORAL | Status: DC | PRN
  Administered 2015-05-07: 5 mg via ORAL
  Filled 2015-05-07: qty 1
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
## 2015-05-07 NOTE — ED Notes (Signed)
Brother approached nursing desk and reports "unfortunately, we're taking him back to the same environment from which he came".  Brother understands that pt does not meet inpatient criteria and reports they are unable to care for him and requesting that "someone here knows how we feel".

## 2015-05-07 NOTE — Progress Notes (Signed)
CSW met with [name] [phone]), and [name], Alex Mcintosh regarding concerns with Patient's safety. Alex Mcintosh reports that her father is unsafe to return home to her father's continued reports of "wanting to die". Alex Mcintosh reports that [name] with his wife who no longer wants him in the home due to his medical and mental health symptoms. CSW discussed with family the eligibility requirements for Medicare Coverage for a nursing facility including a 3 day inpatient qualifying hospital stay, a hospital-related medical condition, and the doctor determining that the patient needs daily skilled care given by, or under direct supervision of, skilled nursing or therapy staff. Patient does not qualify for medicare coverage at this time. Family reports an understanding and reports that they are unable to private pay at this time. She reports that she is currently in communication with her father's life insurance company to inquire if they can utilize some of the funding for long term care. Patient's family reports that a palliative nurse practitioner, Alex Mcintosh [phone]) assessed patient on last week and determined that Patient did not qualify for services but did need more intensive care. CSW discussed the process of guardianship with the family and provided them with resources for how to work towards obtaining guardianship and contact for [place]. CSW also provided family with resources for local ALFs, [facility], and nursing facilities. CSW provided family with a list of [facility] facilities in [place]. CSW emphasized that Patient does have a voluntary status. Patient's family is currently requesting that Patient be evaluated psychiatrically prior to discharge. CSW will staff with attending MD and nurse.   CSW spoke with Palliative Nurse  Practitioner who reports that he feels that patient strongly needs to be placed in a rehab facility. Palliative nurse reports that patient is very manipulative and describes patient and situation as "a mess". Alex Mcintosh reports that Patient constantly describes wanting to be "sedated until he dies". Alex Mcintosh reports that he has not initiated an APS report and is unsure of if the family has initiated one or not. CSW will follow up with APS. CSW will continue to follow for disposition.   Holly Bodily, [place]

## 2015-05-07 NOTE — ED Notes (Addendum)
Patient states he hurts from his trunk down both legs, up his back and into his neck.  Family is concerned he is taking to much pain med attempting to control his pain.  Is seen by the pain clinic for the same

## 2015-05-07 NOTE — Discharge Instructions (Signed)
°Emergency Department Resource Guide °1) Find a Doctor and Pay Out of Pocket °Although you won't have to find out who is covered by your insurance plan, it is a good idea to ask around and get recommendations. You will then need to call the office and see if the doctor you have chosen will accept you as a new patient and what types of options they offer for patients who are self-pay. Some doctors offer discounts or will set up payment plans for their patients who do not have insurance, but you will need to ask so you aren't surprised when you get to your appointment. ° °2) Contact Your Local Health Department °Not all health departments have doctors that can see patients for sick visits, but many do, so it is worth a call to see if yours does. If you don't know where your local health department is, you can check in your phone book. The CDC also has a tool to help you locate your state's health department, and many state websites also have listings of all of their local health departments. ° °3) Find a Walk-in Clinic °If your illness is not likely to be very severe or complicated, you may want to try a walk in clinic. These are popping up all over the country in pharmacies, drugstores, and shopping centers. They're usually staffed by nurse practitioners or physician assistants that have been trained to treat common illnesses and complaints. They're usually fairly quick and inexpensive. However, if you have serious medical issues or chronic medical problems, these are probably not your best option. ° °No Primary Care Doctor: °- Call [facility] at  832-8000 - they can help you locate a primary care doctor that  accepts your insurance, provides certain services, etc. °- Physician Referral Service- 1-800-533-3463 ° °Chronic Pain Problems: °Organization         Address  Phone   Notes  °[facility]  (336) 297-2271 Patients need to be referred by their primary care doctor.  ° °Medication  Assistance: °Organization         Address  Phone   Notes  °[place] [facility] [address] °[place], [place] [address] (336) 641-8030 --Must be a resident of [place] °-- Must have NO insurance coverage whatsoever (no Medicaid/ Medicare, etc.) °-- The pt. MUST have a primary care doctor that directs their care regularly and follows them in the community °  °[facility]  (866) 331-1348   °[employer]  (888) 892-1162   ° °Agencies that provide inexpensive medical care: °Organization         Address  Phone   Notes  °[facility]  (336) 832-8035   ° Internal Medicine    (336) 832-7272   °[facility] [address] °[place], [place] [address] (336) 832-4777   °[facility] [address], °[place] (336) 271-4999   °[facility]    (336) 373-0678   °[facility]    (336) 272-1050   °[facility] ° [address],  Phone:  (336) 832-4444, Fax:  (336) 832-4440 Hours of Operation:  9 am - 6 pm, M-F.  Also accepts Medicaid/Medicare and self-pay.  °[facility] ° [address],  Phone: (336) 832-3150, Fax: (336) 832-3151. Hours of Operation:  8:30 am - 5:30 pm, M-F.  Also accepts Medicaid and self-pay.  °[facility] [place] [address], [place] Phone: (336) 878-6027   °[facility] [address], [place], [place] (336)723-1848, Ext. 123 Mondays & Thursdays: 7-9 AM.  First 15 patients are seen on a first come, first serve basis. °  ° °Medicaid-accepting [place] Providers: ° °Organization         Address  Phone   Notes  °[facility] [address], [place] (336) 641-2100 Also accepts self-pay patients.  °[facility] [address], [place] ° (336) 856-9996   °[facility] [address], [place]  (336) 288-8857   °[facility] [address] [place] [address], [place] (336) 299-7000   °Veita Bland [address], [place]  ° (336) 373-1557 Only accepts [place] Access Medicaid patients after they have their name applied to their card.  ° °Self-Pay (no insurance) in [place]: ° °Organization         Address  Phone   Notes  °Sickle Cell Patients, [facility] [address], [place] (336) 832-1970   °[facility] [address], [place] (336) 832-4400   °[facility] ° [address],  (336) 992-4800   °[facility]/Dr. Osei-Bonsu ° [address] [place] [address], [place] or [address], [place] (336) 841-8500 Phone number for both [place] and [place] locations is the same.  °[facility] [address], [place] (336) 299-0000   °[facility] [address] [place] [address], [place] or [address] (336) 852-7530 °(336) 878-2260   °[facility] [address], [place] (336) 350-1642, phone; (336) 294-5005, fax Sees patients 1st and 3rd Saturday of every month.  Must not qualify for public or private insurance (i.e. Medicaid, Medicare, [place] Health Choice, Veterans' Benefits) • Household income should be no more than 200% of the poverty level •The clinic cannot treat you if you are pregnant or think you are pregnant • Sexually transmitted diseases are not treated at the clinic.  ° ° °Dental Care: °Organization         Address  Phone  Notes  °[place] [facility] [address], [place] (336) 641-6152 Accepts children up to age 21 who are enrolled in Medicaid or [place] Health Choice; pregnant women with a Medicaid card; and children who have applied for Medicaid or [employer] Health Choice, but were declined, whose parents can pay a reduced fee at time of service.  °[place]  [facility] [place]  [address], [place] (336) 641-7733 Accepts children up to age 21 who are enrolled in Medicaid or [place] Health Choice; pregnant women with a Medicaid card; and children who have applied for Medicaid or [place] Health Choice, but were declined, whose parents can pay a reduced fee at time of service.  °[facility] ° [address],  (336) 641-4533 Patients are seen by appointment only. Walk-ins are not accepted. [facility] will see patients 18 years of age and older. °Monday - Tuesday (8am-5pm) °Most Wednesdays (8:30-5pm) °$30 per visit, cash only  °[facility] ° [address], [place] (336) 641-4533 Patients are seen by appointment only. Walk-ins are not accepted. [facility] will see patients 18 years of age and older. °One   Wednesday Evening (Monthly: Volunteer Based).  $30 per visit, cash only  °[facility]  (919) 537-3737 for adults; Children under age 4, call Graduate Pediatric Dentistry at ([phone]) [phone]. Children aged 4-14, please call (919) [phone] to request a pediatric application. ° Dental services are provided in all areas of dental care including fillings, crowns and bridges, complete and partial dentures, implants, gum treatment, root canals, and extractions. Preventive care is also provided. Treatment is provided to both adults and children. °Patients are selected via a lottery and there is often a waiting list. °  °[facility] [address], °[place] ° (336) 763-8833 [email or web address] °  °[facility] [address], [place], [place] (336)723-1848, Ext. 123 Second and Fourth Thursday of each month, opens at 6:30 AM; Clinic ends at 9 AM.  Patients are seen on a first-come first-served basis, and a limited number are seen during each clinic.  ° °[facility] ° [address], [place], [place] (336) 723-7904    Eligibility Requirements °You must have lived in [place], [place], or [place] counties for at least the last three months. °  You cannot be eligible for state or federal sponsored healthcare insurance, including Veterans Administration, Medicaid, or Medicare. °  You generally cannot be eligible for healthcare insurance through your employer.  °  How to apply: °Eligibility screenings are held every Tuesday and Wednesday afternoon from 1:00 pm until 4:00 pm. You do not need an appointment for the interview!  °[facility] [address], [place], [place] 336-631-2330   °[facility]  336-342-8273   °[facility]  336-703-3100   °[facility]  336-570-6415   ° °Behavioral Health Resources in the Community: °Intensive Outpatient Programs °Organization         Address  Phone  Notes  °[facility] [address], [place], [place] 336-878-6098   °[facility] [address], [place], [place] 336-832-9800   °[facility] [address], [place], [place] ° 336-882-2125   °[facility] [address],  °[place], [place] 1-800-853-5163 or 336-641-4981   °Substance Abuse Resources °Organization         Address  Phone  Notes  °[facility]  336-882-2125   °[facility]  336-784-9470   °[facility]  336-285-9073   °[facility]  336-845-3988   °Residential & Outpatient Substance Abuse Program  1-800-659-3381   °Psychological Services °Organization         Address  Phone  Notes  °[facility]  336- 832-9600   °[facility]  336- 378-7881   °[facility] [address], [place] 1-800-853-5163 or 336-641-4981   ° °Mobile Crisis Teams °Organization         Address  Phone  Notes  °[facility], Mobile Crisis Care Unit  1-877-626-1772   °Assertive °[facility] ° [address]  [place], [place] 336-834-9664   °Sharon DeEsch [address] °[place] 336-554-5454   ° °Self-Help/Support Groups °Organization         Address  Phone             Notes  °[facility] - variety of support groups  336- 373-1402 Call for more information  °Narcotics Anonymous (NA), [facility] [address], °[place]  2 meetings at this location  ° °  Residential Treatment Programs °Organization         Address  Phone  Notes  °[facility] [address],    °[place]  1-866-801-8205   °[facility] ° [address], [place], [place] 704-293-8524   °[facility] [address], [place] 336-845-3988 Admissions: 8am-3pm M-F  °[facility] [address]    °[place], [place] 336-841-1104   °[facility] [address], [place], [place] 336-379-7146   °[facility] [address]  °[place], [place] 336-285-9073   °[facility] Intensive Outpatient [address], [place], [place] 336-852-3033   °[facility] [address]  °[place], [place] 1-877-615-2722 or 336-784-9470   °[facility] [address] [place], [place] 336-227-7417 Accepts Medicaid  °[facility] [address]  °[place] 1-800-659-3381 Substance Abuse/Addiction Treatment  ° °[place] Behavioral Health Resources °Organization         Address  Phone  Notes  °[facility]  (888) 581-9988   °Julie Brannon, PhD [address] [place], [place]   (336) 349-5553 or (336) 951-0000   °[facility]   [address] °[place], [place] (336) 349-4454   °[facility] [address], [place], [place] (336) 342-8316 Insurance/Medicaid/sponsorship through [facility]  °[facility] [address]                                    [place], [place] (336) 342-8316 Therapy/tele-psych/case    °[facility] [address]  ° [place], [place] (336) 349-2233    °Dr. Arfeen  (336) 349-4544   °[facility] [place]  [employer] [place] [facility] 1) [address], [place] °2) [address], [place] °3)  [address], [place] (336) 349-3220 °(336) 342-7768 ° °(336) 342-8140   °[place] [facility] (336) 342-1394 or (336) 342-3537 (After Hours)    ° ° °

## 2015-05-07 NOTE — Progress Notes (Signed)
   05/06/15 0000  CM Assessment  Expected Discharge Plan Home w Hospice Care  In-house Referral Clinical Social Work  Discharge Planning Services CM Consult  [facility]  Choice offered to / list presented to  Sibling  DME Agency [facility]  Status of Service Completed, signed off  Discharge Disposition Home w Home Health Services    64 Yr old medicare pt [facility]  For assistance with getting pt assistance for home health  Cm spoke with Pt's brother from [place] who states about private duty nursing (PDN) and home health services Brother confirms pt is already seen by palliative care/hospice and [facility].  Brother interested in pt being seen on a daily basis  Cm informed brother CM would consult with [facility] and palliative/hospice to see about any possible increase services

## 2015-05-07 NOTE — Progress Notes (Signed)
CSW reviewed patient's records. At this time, Patient has no acute indication for admission to the hospital. Patient does not have a 3 day qualifying stay for Medicare to cover SNF placement. Per. Dr. Patria Maneampos, he would like Case Management to assist family with outpatient resources for outpatient placement. He is also recommending home health nurse, home health aide, and home health social worker be established for patient. CSW will staff with Marval RegalKim Gibbs, RN CM for home health needs. CSW will contact Patient's family regarding private pay option for placement as well. CSW will continue to follow.   Noe GensAshley Gardner, LCSWA [phone]

## 2015-05-07 NOTE — ED Notes (Signed)
[facility]

## 2015-05-07 NOTE — Telephone Encounter (Signed)
CSW engaged with Patient's son- Esmond PlantsChris Thayer at RN Case Manager, Marval RegalKim Gibbs [phone]) request. Patient's son reports that upon returning home and beginning discussion of facility placement, the Patient begin to state that he was going to take more than the prescribed amount of medication he is supposed to take in efforts to speed up the process of heart failure. CSW discussed the IVC process and explained to Patient's son that if they feel that the Patient is in imminent danger to himself or others, that they should call 911 or have him IVC'ed. CSW also reiterated the option of private pay for a nursing facility or assisted living facility where he can receive 24 hour care. Patient's son reports that they are going to lock his medications in a locked dispenser.   Noe GensAshley Gardner, LCSWA [phone]

## 2015-05-07 NOTE — BH Assessment (Addendum)
Tele Assessment Note   Alex Mcintosh is an 64 y.o. male who present voluntarily to [facility] with c/o chronic pain. He is referred for a psych assessment at the request of his family, who believes pt is taking extra pain medications than is prescribed.   Pt is oriented x4. His mood and affect are pleasant, apathetic, and appropriate. Pt shared that he has been in constant pain for 32 years. He denies suicidal ideation or intent, but freely admits that he "want to let nature take its course" and is "ready to go home". Pt denies hopelessness, but endorses "weariness". Pt denies HI or AVH. Pt indicates that he goes thru cycles where he can sleep well and then is not able to sleep for some weeks. Pt indicated that he will take a couple more of his valium during those times of sleeplessness. He reported that sometimes it works and when it doesn't work, he doesn't take anymore, he just accepts the sleeplessness. Pt denies any hx of or current illicit drug or alcohol use.   Pt has been prescribed Cymbalta for several years and it is filled thru his pain mgmt clinic. Pt has never had a psych IP hospitalization, nor has he rec'd psychiatric treatment.    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 Hyperlipemia   . Chronic back pain   . GERD (gastroesophageal reflux disease)   . Anxiety   . Chronic narcotic dependence (HCC)   . Hypothyroidism   . Depression   . Myoclonus   . UTI (lower urinary tract infection) 5/14    was confused and was found in [place].  Marland Kitchen Acute encephalopathy 09/06/12    was admitted to [facility]  . Foot drop, bilateral   . Shortness of breath     with exertion  . CHF (congestive heart failure) (HCC)     diurects stopped in April  . DVT (deep venous thrombosis) (HCC)     01/2012  . Seizures (HCC)     Myoclonus  . Neuropathy (HCC)     legs  . Arthritis   . Memory loss of unknown cause   . 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 Obese   . History of cerebrovascular disease   . Abnormality of gait 08/03/2013  . Cervicogenic headache 10/26/2013    Past Surgical History  Procedure Laterality Date  . Back surgery      4 lumbar  . Hip surgery Left   . Cervical fusion    . Knee arthroscopy Right   . Joint replacement    . Pain pump implantation  Reports no longer has pump  . Tooth extraction N/A 11/16/2012    Procedure: DENTAL EXTRACTIONS OF MULTIPLE NONRESTORABLE TEETH;  Surgeon: Hinton Dyer, DDS;  Location: [facility] OR;  Service: Oral Surgery;  Laterality: N/A;  #12, 14, 15, 22, 23, 24, 25, 26, 27, 28    Family History:  Family History  Problem Relation Age of Onset  . CAD Father   . Heart disease Father   . Heart failure Mother   . Breast cancer Sister   . CAD Brother     Social History:  reports that he has never smoked. He has never used smokeless tobacco. He reports that he does not drink alcohol or use illicit drugs.  Additional Social History:     CIWA: CIWA-Ar BP: 157/98 mmHg  Pulse Rate: 104 COWS:    PATIENT STRENGTHS: (choose at least two) Ability for insight Average or above average intelligence Communication skills General fund of knowledge Supportive family/friends  Allergies:  Allergies  Allergen Reactions  . Iodinated Diagnostic Agents Other (See Comments)    Patient does not recollect  . Latex Other (See Comments)    Patient does not recollect  . Statins Other (See Comments)    Causes elevation of liver enzymes  . Ceftin [Cefuroxime Axetil] Hives and Rash    Home Medications:  (Not in a hospital admission)  OB/GYN Status:  No LMP for male patient.  General Assessment Data Location of Assessment: [facility] ED TTS Assessment: In system Is this a Tele or Face-to-Face Assessment?: Tele Assessment Is this an Initial Assessment or a Re-assessment for this encounter?: Initial Assessment Marital status: Married Is patient pregnant?:  No Pregnancy Status: No Living Arrangements: Spouse/significant other Can pt return to current living arrangement?: Yes Admission Status: Voluntary Is patient capable of signing voluntary admission?: No Referral Source: Self/Family/Friend Insurance type: Medicare  Medical Screening Exam [facility] Walk-in ONLY) Medical Exam completed: Yes  Crisis Care Plan Living Arrangements: Spouse/significant other Legal Guardian: Other: (see above) Name of Psychiatrist: none Name of Therapist: none  Education Status Is patient currently in school?: No  Risk to self with the past 6 months Suicidal Ideation: No Has patient been a risk to self within the past 6 months prior to admission? : No Suicidal Intent: No Has patient had any suicidal intent within the past 6 months prior to admission? : No Is patient at risk for suicide?: No Suicidal Plan?: No Has patient had any suicidal plan within the past 6 months prior to admission? : No Access to Means: No What has been your use of drugs/alcohol within the last 12 months?: pt denies Previous Attempts/Gestures: No Intentional Self Injurious Behavior: None Family Suicide History: Unknown Recent stressful life event(s): Other (Comment) (Chronic pain; Congestive Heart Failure) Persecutory voices/beliefs?: No Depression: Yes Depression Symptoms: Insomnia, Fatigue Substance abuse history and/or treatment for substance abuse?: No Suicide prevention information given to non-admitted patients: Yes  Risk to Others within the past 6 months Homicidal Ideation: No Does patient have any lifetime risk of violence toward others beyond the six months prior to admission? : No Thoughts of Harm to Others: No Current Homicidal Intent: No Current Homicidal Plan: No Access to Homicidal Means: No History of harm to others?: No Assessment of Violence: None Noted Does patient have access to weapons?: No Criminal Charges Pending?: No Does patient have a court date:  No Is patient on probation?: No  Psychosis Hallucinations: None noted Delusions: None noted  Mental Status Report Appearance/Hygiene: In hospital gown, Unremarkable Eye Contact: Good Motor Activity: Unremarkable Speech: Logical/coherent Level of Consciousness: Alert Mood: Pleasant, Apathetic Affect: Apathetic, Appropriate to circumstance Anxiety Level: None Thought Processes: Coherent, Relevant Judgement: Unimpaired Orientation: Person, Place, Time, Situation, Appropriate for developmental age Obsessive Compulsive Thoughts/Behaviors: None  Cognitive Functioning Concentration: Normal Memory: Recent Intact, Remote Impaired IQ: Average Insight: Good Impulse Control: Good Appetite: Fair Sleep: Decreased Vegetative Symptoms: None  ADLScreening [facility] Assessment Services) Patient's cognitive ability adequate to safely complete daily activities?: Yes Patient able to express need for assistance with ADLs?: Yes Independently performs ADLs?: No  Prior Inpatient Therapy Prior Inpatient Therapy: No  Prior Outpatient Therapy Prior Outpatient Therapy: Yes Prior Therapy Dates: twice: 8 yrs ago and 5 yrs ago Prior Therapy Facilty/Provider(s): unknown Reason for Treatment: admission to pain clinic; loss of  sister Does patient have an ACCT team?: No Does patient have Intensive In-House Services?  : No Does patient have [facility] services? : No Does patient have P4CC services?: No  ADL Screening (condition at time of admission) Patient's cognitive ability adequate to safely complete daily activities?: Yes Is the patient deaf or have difficulty hearing?: No Does the patient have difficulty seeing, even when wearing glasses/contacts?: No Does the patient have difficulty concentrating, remembering, or making decisions?: No Patient able to express need for assistance with ADLs?: Yes Does the patient have difficulty dressing or bathing?: Yes Independently performs ADLs?:  No Communication: Independent Dressing (OT): Needs assistance Is this a change from baseline?: Pre-admission baseline Grooming: Independent Feeding: Independent Bathing: Needs assistance Is this a change from baseline?: Pre-admission baseline Toileting: Independent In/Out Bed: Independent Walks in Home: Independent with device (comment) Does the patient have difficulty walking or climbing stairs?: Yes Weakness of Legs: Both Weakness of Arms/Hands: Both     Therapy Consults (therapy consults require a physician order) PT Evaluation Needed: No OT Evalulation Needed: No SLP Evaluation Needed: No Abuse/Neglect Assessment (Assessment to be complete while patient is alone) Physical Abuse: Denies Verbal Abuse: Denies Sexual Abuse: Denies Exploitation of patient/patient's resources: Denies Self-Neglect: Denies Values / Beliefs Cultural Requests During Hospitalization: None Spiritual Requests During Hospitalization: None Consults Spiritual Care Consult Needed: No Social Work Consult Needed: No Merchant navy officerAdvance Directives (For Healthcare) Does patient have an advance directive?: No    Additional Information 1:1 In Past 12 Months?: No CIRT Risk: No Elopement Risk: No Does patient have medical clearance?: Yes     Disposition:  Disposition Initial Assessment Completed for this Encounter: Yes Disposition of Patient: Other dispositions (per Fransisca KaufmannLaura Davis, NP) Other disposition(s): Information only (recommendation to obtain a psychiatrist to manage psych meds)  Laddie AquasSamantha M Trinity Haun 05/07/2015 11:50 AM

## 2015-05-07 NOTE — ED Notes (Signed)
Please call Lyda PeroneDan Haik (brother) [phone] or Esmond PlantsChris Belding (son) [phone] when social work arrives in the am.

## 2015-05-07 NOTE — Progress Notes (Signed)
  1133 EDP Knotts updated that Cm spoke with brother an niece about PDN, [facility] and [facility] Stacy Pt to be d/c home  1125 spoke with brother and pt niece via brother's cell # [phone] Niece states she just recently spoke with NP at hospice "will not sign off on 6 months services"  Brother and niece have received PDN list and will try that.  Voiced understanding and appreciation for Cm attempt to get increased services for pt for a daily visit in pt home    1123 spoke with pt RN, Raynelle FanningJulie to updated that hospice & home health to attempt to increase services She will given PDN and home health lists to pt/family CM consulted with Kennyth ArnoldStacy of [facility] to confirm pt is palliative since 04/16/15 and she will check to see if pt is eligible for hospice or increase services Farrah from [facility] states HHSW, Melissa  working with pt and [facility] notes indicate possible need for hospice/palliatve Pt illness listed not terminal [facility] notes indicate pt would not sign a hospice referral States will attempt to max the home health services for pt

## 2015-05-07 NOTE — ED Notes (Signed)
Notified by pharmacy that patient had bottle for 120 valium, filled recently, but no valium was found.

## 2015-05-07 NOTE — ED Notes (Signed)
Patients daughter is requesting to talk with the MD without the patient being near by.

## 2015-05-07 NOTE — Progress Notes (Signed)
Entered in d/c instructions [facility] On 05/07/2015 NOW called [facility]- Will see about increasing pt services in the home They will contact you but you may call as needed [address] [place] [id number] [phone] [facility] On 05/07/2015 Will see about increasing services in the home or going from palliative to hospice They will contact you but you may call as needed [address] [place] [phone] Please use the list of private duty nursing (PDN) list provided to you in the emergency room Call on 05/07/2015 PDN services will need to be called by family Unfornately the case manager will not be able per [facility] policy This will be an out of pocket expense PDN can be daily visits if needed

## 2015-05-07 NOTE — Progress Notes (Signed)
CSW called Marval RegalKim Gibbs re: case management needs. Voice message left. CSW contacted Patient's son, Mr. Lyda PeroneDan Bielefeld ([phone]) who informed CSW that he is in route to the hospital. CSW will meet with family when they arrive. CSW will continue to follow.   Noe GensAshley Gardner, LCSWA [phone]

## 2015-05-07 NOTE — ED Notes (Signed)
A regular diet ordered for patient. 

## 2015-06-19 ENCOUNTER — Telehealth: Payer: Self-pay | Admitting: Neurology

## 2015-06-19 NOTE — Telephone Encounter (Signed)
I called the patient. He stated that he is falling apart. He c/o having seizures once a daily. He states he is taking his keppra religiously. He states he was recently switched from valium to ativan. He states valium helped his seizures, but the ativan does not. He also wants to talk to Dr. Anne Hahn about a morphine pump. He states he had one in 2008 and it helped with his hip pain. I advised that he contact his pain management doctor for this and he stated that Dr. Anne Hahn is the one who was trying to set this up. I advised that I would let him know.

## 2015-06-19 NOTE — Telephone Encounter (Signed)
Pt called inquiring about using an external pump for pain meds and seizure meds if possible. He said he's still having convulsions. Pt said he will be going to skilled nursing when a bed becomes available. 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

## 2015-06-19 NOTE — Telephone Encounter (Signed)
I called patient. The patient still having what he calls daily seizures, he had a recent fall without. We may consider a referral for video EEG monitoring, EEG done through our office revealed evidence of nonepileptic events. In regards to the morphine pump, I'm not aware of and one around here that can manage these.

## 2015-07-08 ENCOUNTER — Ambulatory Visit: Payer: Medicare Other | Admitting: Neurology

## 2015-07-15 ENCOUNTER — Other Ambulatory Visit: Payer: Self-pay | Admitting: Neurology

## 2015-08-11 ENCOUNTER — Other Ambulatory Visit: Payer: Self-pay | Admitting: Neurology

## 2015-08-27 ENCOUNTER — Telehealth: Payer: Self-pay | Admitting: Neurology

## 2015-08-27 ENCOUNTER — Encounter (HOSPITAL_COMMUNITY): Payer: Self-pay

## 2015-08-27 ENCOUNTER — Emergency Department (HOSPITAL_COMMUNITY)
Admission: EM | Admit: 2015-08-27 | Discharge: 2015-08-27 | Disposition: A | Discharge status: Home / Self Care | Payer: Medicare Other | Attending: Emergency Medicine | Admitting: Emergency Medicine

## 2015-08-27 DIAGNOSIS — Z86718 Personal history of other venous thrombosis and embolism: Secondary | ICD-10-CM | POA: Insufficient documentation

## 2015-08-27 DIAGNOSIS — G8929 Other chronic pain: Secondary | ICD-10-CM | POA: Diagnosis not present

## 2015-08-27 DIAGNOSIS — I509 Heart failure, unspecified: Secondary | ICD-10-CM | POA: Insufficient documentation

## 2015-08-27 DIAGNOSIS — Z91040 Latex allergy status: Secondary | ICD-10-CM | POA: Insufficient documentation

## 2015-08-27 DIAGNOSIS — E669 Obesity, unspecified: Secondary | ICD-10-CM | POA: Diagnosis not present

## 2015-08-27 DIAGNOSIS — F329 Major depressive disorder, single episode, unspecified: Secondary | ICD-10-CM | POA: Diagnosis not present

## 2015-08-27 DIAGNOSIS — F419 Anxiety disorder, unspecified: Secondary | ICD-10-CM | POA: Diagnosis not present

## 2015-08-27 DIAGNOSIS — Z87440 Personal history of urinary (tract) infections: Secondary | ICD-10-CM | POA: Insufficient documentation

## 2015-08-27 DIAGNOSIS — K219 Gastro-esophageal reflux disease without esophagitis: Secondary | ICD-10-CM | POA: Insufficient documentation

## 2015-08-27 DIAGNOSIS — R569 Unspecified convulsions: Secondary | ICD-10-CM | POA: Insufficient documentation

## 2015-08-27 DIAGNOSIS — M1990 Unspecified osteoarthritis, unspecified site: Secondary | ICD-10-CM | POA: Insufficient documentation

## 2015-08-27 DIAGNOSIS — Z79899 Other long term (current) drug therapy: Secondary | ICD-10-CM | POA: Diagnosis not present

## 2015-08-27 LAB — COMPREHENSIVE METABOLIC PANEL
ALBUMIN: 3.7 g/dL (ref 3.5–5.0)
ALT: 16 U/L — ABNORMAL LOW (ref 17–63)
ANION GAP: 4 — AB (ref 5–15)
AST: 22 U/L (ref 15–41)
Alkaline Phosphatase: 103 U/L (ref 38–126)
BILIRUBIN TOTAL: 0.9 mg/dL (ref 0.3–1.2)
BUN: 10 mg/dL (ref 6–20)
CHLORIDE: 108 mmol/L (ref 101–111)
CO2: 29 mmol/L (ref 22–32)
Calcium: 8.8 mg/dL — ABNORMAL LOW (ref 8.9–10.3)
Creatinine, Ser: 0.79 mg/dL (ref 0.61–1.24)
GFR calc Af Amer: >60 mL/min (ref >60)
GFR calc non Af Amer: >60 mL/min (ref >60)
GLUCOSE: 104 mg/dL — AB (ref 65–99)
POTASSIUM: 4.3 mmol/L (ref 3.5–5.1)
SODIUM: 141 mmol/L (ref 135–145)
TOTAL PROTEIN: 6.6 g/dL (ref 6.5–8.1)

## 2015-08-27 NOTE — Telephone Encounter (Signed)
I called Dr. Freida BusmanAllen, the event that the patient had was his typical left-sided jerking, EEG studies done previously have shown nonepileptic events, no alteration in treatment, follow-up through this office.

## 2015-08-27 NOTE — ED Notes (Signed)
His son remains with him.  He is more alert and remains in no distress.  He is oriented x 4 with clear speech.  Per his request, I asked Dr. Freida BusmanAllen of plan and he tells me he will speak with neurology and then speak with pt.; which information I relay to pt. And his son.

## 2015-08-27 NOTE — ED Notes (Signed)
He had no seizure activity during this E.D. Visit.  He remains awake and alert and in no distress.  D/c with his son.

## 2015-08-27 NOTE — ED Notes (Signed)
He has had multiple, short seizures today (each last ~5-6 seconds) with hx of same.  Paramedics gave 2/5mg  of Versed IV en route.  He arrives drowsy and in no distress.

## 2015-08-27 NOTE — Telephone Encounter (Signed)
The patient went to the hospital today for an episode of left sided jerking. This is typical for his usual events, EEG studies have shown nonepileptic episodes, likely behavior events. I would not alter any medications, the patient can follow-up through this office.

## 2015-08-27 NOTE — Discharge Instructions (Signed)
Follow-up with Dr. Anne HahnWillis as needed

## 2015-08-27 NOTE — ED Provider Notes (Signed)
CSN: [id number]     Arrival date & time 08/27/15  1258 History   First MD Initiated Contact with Patient 08/27/15 1308     Chief Complaint  Patient presents with  . Seizures     (Consider location/radiation/quality/duration/timing/severity/associated sxs/prior Treatment) HPI Comments: Patient here after having multiple short seizures lasting were several seconds just prior to arrival. Was given Versed by paramedics prior to arrival. Patient states that he has been compliant with his Keppra which he takes for seizures. He describes the seizures as left-sided tingling that he is awake during these events. Denies any recent illnesses.  Patient is a 65 y.o. male presenting with seizures. The history is provided by the patient.  Seizures   Past Medical History  Diagnosis Date  . Hyperlipemia   . Chronic back pain   . GERD (gastroesophageal reflux disease)   . Anxiety   . Chronic narcotic dependence (HCC)   . Hypothyroidism   . Depression   . Myoclonus   . UTI (lower urinary tract infection) 5/14    was confused and was found in [place].  Marland Kitchen Acute encephalopathy 09/06/12    was admitted to [facility]  . Foot drop, bilateral   . Shortness of breath     with exertion  . CHF (congestive heart failure) (HCC)     diurects stopped in April  . DVT (deep venous thrombosis) (HCC)     01/2012  . Seizures (HCC)     Myoclonus  . Neuropathy (HCC)     legs  . Arthritis   . Memory loss of unknown cause   . AVN (avascular necrosis of bone) (HCC)     right  . Obese   . History of cerebrovascular disease   . Abnormality of gait 08/03/2013  . Cervicogenic headache 10/26/2013   Past Surgical History  Procedure Laterality Date  . Back surgery      4 lumbar  . Hip surgery Left   . Cervical fusion    . Knee arthroscopy Right   . Joint replacement    . Pain pump implantation  Reports no longer has pump  . Tooth extraction N/A 11/16/2012    Procedure: DENTAL EXTRACTIONS OF MULTIPLE NONRESTORABLE TEETH;   Surgeon: Hinton Dyer, DDS;  Location: [facility] OR;  Service: Oral Surgery;  Laterality: N/A;  #12, 14, 15, 22, 23, 24, 25, 26, 27, 28   Family History  Problem Relation Age of Onset  . CAD Father   . Heart disease Father   . Heart failure Mother   . Breast cancer Sister   . CAD Brother    Social History  Substance Use Topics  . Smoking status: Never Smoker   . Smokeless tobacco: Never Used  . Alcohol Use: No     Comment: OCCASSIONAL    Review of Systems  Neurological: Positive for seizures.  All other systems reviewed and are negative.     Allergies  Iodinated diagnostic agents; Latex; Statins; and Ceftin  Home Medications   Prior to Admission medications   Medication Sig Start Date End Date Taking? Authorizing Provider  carisoprodol (SOMA) 350 MG tablet TAKE 1 TABLET BY MOUTH 3 TIMES A DAY 11/22/14   Historical Provider, MD  Cholecalciferol 1000 UNITS TBDP Take 2 tablets by mouth daily.     Historical Provider, MD  diazepam (VALIUM) 5 MG tablet Take 5 mg by mouth every 6 (six) hours.  12/13/14   Historical Provider, MD  diphenhydrAMINE (SOMINEX) 25 MG tablet Take 25  mg by mouth at bedtime as needed for sleep.    Historical Provider, MD  DULoxetine (CYMBALTA) 60 MG capsule Take 2 capsules (120 mg total) by mouth every morning. 01/06/15   York Spanielharles K Willis, MD  levETIRAcetam (KEPPRA) 1000 MG tablet TAKE 1 TABLET BY MOUTH TWICE A DAY 08/11/15   York Spanielharles K Willis, MD  LORazepam (ATIVAN) 1 MG tablet Take 1 mg by mouth 3 (three) times daily.    Historical Provider, MD  Melatonin 5 MG TABS Take 5 mg by mouth at bedtime.    Historical Provider, MD  Multiple Vitamin (MULTIVITAMIN WITH MINERALS) TABS tablet Take 1 tablet by mouth every morning.     Historical Provider, MD  omeprazole (PRILOSEC) 20 MG capsule Take 20 mg by mouth daily.    Historical Provider, MD  oxymorphone (OPANA ER) 30 MG 12 hr tablet Take 30 mg by mouth every 8 (eight) hours.    Historical Provider, MD  oxymorphone (OPANA)  10 MG tablet Take 10 mg by mouth every 6 (six) hours as needed for pain.    Historical Provider, MD  propranolol (INDERAL) 20 MG tablet TAKE 1 TABLET BY MOUTH TWICE A DAY 07/15/15   York Spanielharles K Willis, MD   BP 141/85 mmHg  Pulse 79  Temp(Src) 97.7 F (36.5 C) (Oral)  Resp 18  SpO2 97% Physical Exam  Constitutional: He is oriented to person, place, and time. He appears well-developed and well-nourished.  Non-toxic appearance. No distress.  HENT:  Head: Normocephalic and atraumatic.  Eyes: Conjunctivae, EOM and lids are normal. Pupils are equal, round, and reactive to light.  Neck: Normal range of motion. Neck supple. No tracheal deviation present. No thyroid mass present.  Cardiovascular: Normal rate, regular rhythm and normal heart sounds.  Exam reveals no gallop.   No murmur heard. Pulmonary/Chest: Effort normal and breath sounds normal. No stridor. No respiratory distress. He has no decreased breath sounds. He has no wheezes. He has no rhonchi. He has no rales.  Abdominal: Soft. Normal appearance and bowel sounds are normal. He exhibits no distension. There is no tenderness. There is no rebound and no CVA tenderness.  Musculoskeletal: Normal range of motion. He exhibits no edema or tenderness.  Neurological: He is alert and oriented to person, place, and time. He has normal strength. No cranial nerve deficit or sensory deficit. GCS eye subscore is 4. GCS verbal subscore is 5. GCS motor subscore is 6.  Skin: Skin is warm and dry. No abrasion and no rash noted.  Psychiatric: His affect is blunt. His speech is delayed. He is slowed.  Nursing note and vitals reviewed.   ED Course  Procedures (including critical care time) Labs Review Labs Reviewed  COMPREHENSIVE METABOLIC PANEL    Imaging Review No results found. I have personally reviewed and evaluated these images and lab results as part of my medical decision-making.   EKG Interpretation None      MDM   Final diagnoses:   None    Spoke with patient's neurologist, Dr. Anne HahnWillis, states that patient does not have a seizure disorder and that he recommends no change in his current treatment. Patient had no seizure activity while here in the ER and is stable for discharge    Lorre NickAnthony Quandre Polinski, MD 08/27/15 1521

## 2015-08-27 NOTE — Telephone Encounter (Signed)
Please call Dr. Freida BusmanAllen @ [facility] regarding this patient. Dr. Eliot FordAllen's telephone number is [phone]. Thank you.

## 2015-09-11 ENCOUNTER — Encounter: Payer: Self-pay | Admitting: Adult Health

## 2015-09-11 ENCOUNTER — Ambulatory Visit (INDEPENDENT_AMBULATORY_CARE_PROVIDER_SITE_OTHER): Payer: Medicare Other | Admitting: Adult Health

## 2015-09-11 VITALS — BP 129/81 | HR 86 | Ht 69.0 in | Wt 278.6 lb

## 2015-09-11 DIAGNOSIS — R569 Unspecified convulsions: Secondary | ICD-10-CM | POA: Diagnosis not present

## 2015-09-11 DIAGNOSIS — R269 Unspecified abnormalities of gait and mobility: Secondary | ICD-10-CM | POA: Diagnosis not present

## 2015-09-11 MED ORDER — LEVETIRACETAM 1000 MG PO TABS: ORAL_TABLET | ORAL | Status: AC

## 2015-09-11 NOTE — Progress Notes (Signed)
PATIENT: NATANIEL GASPER DOB: 11-16-1950  REASON FOR VISIT: follow up- seizures HISTORY FROM: patient  HISTORY OF PRESENT ILLNESS: Mr. Dollar is a 65 year old male with a history of chronic seizure disorder. He returns today for follow-up. In the past there seems to be some overlapping pseudoseizures as well as seizures. His EEG indicated nonepileptic events. The patient is currently on Keppra 1000 mg twice a day. He reports that he had a seizure 2 days ago. He states that he was in the dining hall and got up from his chair and fell to the floor. He does not remember this. But reports the resident's state that he fell and was shaking in all extremities. He denies loss of bowels or bladder. Denies biting his tongue or cheek. He does state that he hit his left eye on the wheelchair. There is no abrasion or bruise to the eye. EMS was called but the patient was not transported to the emergency room. He states prior to this he has had other seizure events. The patient continues to have trouble with ambulation. He uses a cane. He continues to follow with pain management. He returns today for an evaluation.  HISTORY 01/06/15 (WILLIS): Mr. 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 The patient has episodes of seizures with loss of consciousness, but he also has events of left-sided jerking without loss of consciousness. The patient has had EEG evaluation done last year, he had 2 events of left-sided jerking that revealed nonepileptic events. 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 He has a chronic gait disorder, indicates that his ability to ambulate has worsened over the last 2 or 3 months. The patient had a CT scan of the brain done in June 2016 that showed no acute changes. The patient has a tendency to fall backwards, even while using his walker. The patient does have chronic  left leg discomfort. He is followed through a pain center for this, taking oral pain medications. He is limited in how long he can walk because of pain. He returns to this office for an evaluation. The patient has been to the emergency room recently on 2 occasions, on August 8 and then on August 15. The patient ran out of his pain medications prior to August 15, he had an episode of left-sided jerking while in the emergency room without loss of consciousness. The ER visit on August 8 was for problems with balance and falls  REVIEW OF SYSTEMS: Out of a complete 14 system review of symptoms, the patient complains only of the following symptoms, and all other reviewed systems are negative.  Frequency of urination, headache, seizures, tremors, joint pain, back pain, aching muscles, muscle cramps, walking difficulty, neck pain, neck stiffness, moles, abdominal pain, wheezing, choking, chest pain, leg swelling, palpitations, light sensitivity, fatigue, runny nose  ALLERGIES: Allergies  Allergen Reactions  . Iodinated Diagnostic Agents Other (See Comments)    Patient does not recollect  . Latex Other (See Comments)    Patient does not recollect  . Statins Other (See Comments)    Causes elevation of liver enzymes  . Ceftin [Cefuroxime Axetil] Hives and Rash    HOME MEDICATIONS: Outpatient Prescriptions Prior to Visit  Medication Sig Dispense Refill  . amoxicillin (AMOXIL) 500 MG tablet Take 2,000 mg by mouth as directed. Take 4 tabs (2000 mg) 1 hour prior to surgery.    Marland Kitchen  buPROPion (WELLBUTRIN SR) 150 MG 12 hr tablet Take 150 mg by mouth daily.    . carisoprodol (SOMA) 350 MG tablet TAKE 1 TABLET BY MOUTH 3 TIMES A DAY  3  . diazepam (VALIUM) 5 MG tablet Take 5 mg by mouth every 6 (six) hours.   3  . diazepam (VALIUM) 5 MG tablet Take 5 mg by mouth every 6 (six) hours as needed (seizure activity).    . diphenhydrAMINE (SOMINEX) 25 MG tablet Take 25 mg by mouth at bedtime as needed for sleep.    Marland Kitchen  docusate sodium (COLACE) 100 MG capsule Take 100 mg by mouth daily.    . DULoxetine (CYMBALTA) 60 MG capsule Take 2 capsules (120 mg total) by mouth every morning. 60 capsule 5  . ezetimibe (ZETIA) 10 MG tablet Take 10 mg by mouth daily.    Marland Kitchen levETIRAcetam (KEPPRA) 1000 MG tablet TAKE 1 TABLET BY MOUTH TWICE A DAY 60 tablet 11  . Menthol, Topical Analgesic, (BIOFREEZE) 4 % GEL Apply 1 application topically 3 (three) times daily as needed (pain).    Marland Kitchen metoCLOPramide (REGLAN) 10 MG tablet Take 10 mg by mouth every 8 (eight) hours.    Marland Kitchen oxymorphone (OPANA ER) 30 MG 12 hr tablet Take 30 mg by mouth every 8 (eight) hours.    Marland Kitchen oxymorphone (OPANA) 10 MG tablet Take 10 mg by mouth every 6 (six) hours as needed for pain.    Marland Kitchen propranolol (INDERAL) 20 MG tablet TAKE 1 TABLET BY MOUTH TWICE A DAY 60 tablet 1   No facility-administered medications prior to visit.    PAST MEDICAL HISTORY: Past Medical History  Diagnosis Date  . Hyperlipemia   . Chronic back pain   . GERD (gastroesophageal reflux disease)   . Anxiety   . Chronic narcotic dependence (HCC)   . Hypothyroidism   . Depression   . Myoclonus   . UTI (lower urinary tract infection) 5/14    was confused and was found in [place].  Marland Kitchen Acute encephalopathy 09/06/12    was admitted to [facility]  . Foot drop, bilateral   . Shortness of breath     with exertion  . CHF (congestive heart failure) (HCC)     diurects stopped in April  . DVT (deep venous thrombosis) (HCC)     01/2012  . Seizures (HCC)     Myoclonus  . Neuropathy (HCC)     legs  . Arthritis   . Memory loss of unknown cause   . AVN (avascular necrosis of bone) (HCC)     right  . Obese   . History of cerebrovascular disease   . Abnormality of gait 08/03/2013  . Cervicogenic headache 10/26/2013    PAST SURGICAL HISTORY: Past Surgical History  Procedure Laterality Date  . Back surgery      4 lumbar  . Hip surgery Left   . Cervical fusion    . Knee arthroscopy Right   . Joint  replacement    . Pain pump implantation  Reports no longer has pump  . Tooth extraction N/A 11/16/2012    Procedure: DENTAL EXTRACTIONS OF MULTIPLE NONRESTORABLE TEETH;  Surgeon: Hinton Dyer, DDS;  Location: [facility] OR;  Service: Oral Surgery;  Laterality: N/A;  #12, 14, 15, 22, 23, 24, 25, 26, 27, 28    FAMILY HISTORY: Family History  Problem Relation Age of Onset  . CAD Father   . Heart disease Father   . Heart failure Mother   .  Breast cancer Sister   . CAD Brother     SOCIAL HISTORY: Social History   Social History  . Marital Status: Married    Spouse Name: Mindi Junker  . Number of Children: 3  . Years of Education: COLLEGE-M   Occupational History  .  Other    disability   Social History Main Topics  . Smoking status: Never Smoker   . Smokeless tobacco: Never Used  . Alcohol Use: No     Comment: OCCASSIONAL  . Drug Use: No  . Sexual Activity: Not on file   Other Topics Concern  . Not on file   Social History Narrative   Patient lives at home with spouse.   Caffeine Use: 2 cups daily   Patient is right handed.      PHYSICAL EXAM  Filed Vitals:   09/11/15 1514  BP: 129/81  Pulse: 86  Height: 5\' 9"  (1.753 m)  Weight: 278 lb 9.6 oz (126.372 kg)   Body mass index is 41.12 kg/(m^2).  Generalized: Well developed, in no acute distress   Neurological examination  Mentation: Alert oriented to time, place, history taking. Follows all commands speech and language fluent Cranial nerve II-XII: Pupils were equal round reactive to light. Extraocular movements were full, visual field were full on confrontational test. Facial sensation and strength were normal. Uvula tongue midline. Head turning and shoulder shrug  were normal and symmetric. Motor: The motor testing reveals 5 over 5 strength of all 4 extremities. Good symmetric motor tone is noted throughout.  Sensory: Sensory testing is intact to soft touch on all 4 extremities. No evidence of extinction is noted.    Coordination: Cerebellar testing reveals good finger-nose-finger difficulty with  heel-to-shin bilaterally due to back pain and pain the LE.  Gait and station: Gait is slow and slightly unsteady. Using a cane when ambulating. Tandem gait not attempted.   Reflexes: Deep tendon reflexes are symmetric and normal bilaterally.   DIAGNOSTIC DATA (LABS, IMAGING, TESTING) - I reviewed patient records, labs, notes, testing and imaging myself where available.      Component Value Date/Time   NA 141 08/27/2015 1335   K 4.3 08/27/2015 1335   CL 108 08/27/2015 1335   CO2 29 08/27/2015 1335   GLUCOSE 104* 08/27/2015 1335   BUN 10 08/27/2015 1335   CREATININE 0.79 08/27/2015 1335   CALCIUM 8.8* 08/27/2015 1335   PROT 6.6 08/27/2015 1335   ALBUMIN 3.7 08/27/2015 1335   AST 22 08/27/2015 1335   ALT 16* 08/27/2015 1335   ALKPHOS 103 08/27/2015 1335   BILITOT 0.9 08/27/2015 1335   GFRNONAA >60 08/27/2015 1335   GFRAA >60 08/27/2015 1335        ASSESSMENT AND PLAN 65 y.o. year old male  has a past medical history of Hyperlipemia; Chronic back pain; GERD (gastroesophageal reflux disease); Anxiety; Chronic narcotic dependence (HCC); Hypothyroidism; Depression; Myoclonus; UTI (lower urinary tract infection) (5/14); Acute encephalopathy (09/06/12); Foot drop, bilateral; Shortness of breath; CHF (congestive heart failure) (HCC); DVT (deep venous thrombosis) (HCC); Seizures (HCC); Neuropathy (HCC); Arthritis; Memory loss of unknown cause; AVN (avascular necrosis of bone) (HCC); Obese; History of cerebrovascular disease; Abnormality of gait (08/03/2013); and Cervicogenic headache (10/26/2013). here with :  1. Seizures 2. Abnormality of gait   Patient continues to have seizure events. His latest event was 2 days ago. I will increase Keppra thousand milligrams to 1-1/2 tablets in the morning and 1 tablet in the evening. Patient is amenable to this plan. I  have advised that if he continues to have seizure  events we may have to consider additional testing such as a video EEG monitoring. Patient verbalized understanding. He will follow-up in 6 months with Dr. Anne HahnWillis.    Butch PennyMegan Shawnika Pepin, MSN, NP-C 09/11/2015, 3:11 PM [facility] [address], [address] [place], [place] [address] [phone]

## 2015-09-11 NOTE — Progress Notes (Signed)
I have read the note, and I agree with the clinical assessment and plan.  Catalaya Garr KEITH   

## 2015-09-11 NOTE — Patient Instructions (Signed)
Increase Keppra to 1.5 tablets in the morning and 1 tablet in the evening If your symptoms worsen or you develop new symptoms please let us know.

## 2015-09-20 ENCOUNTER — Emergency Department (HOSPITAL_COMMUNITY): Payer: Medicare Other

## 2015-09-20 ENCOUNTER — Encounter (HOSPITAL_COMMUNITY): Payer: Self-pay

## 2015-09-20 ENCOUNTER — Emergency Department (HOSPITAL_COMMUNITY)
Admission: EM | Admit: 2015-09-20 | Discharge: 2015-09-20 | Disposition: A | Discharge status: Home / Self Care | Payer: Medicare Other | Attending: Emergency Medicine | Admitting: Emergency Medicine

## 2015-09-20 DIAGNOSIS — Z8739 Personal history of other diseases of the musculoskeletal system and connective tissue: Secondary | ICD-10-CM | POA: Diagnosis not present

## 2015-09-20 DIAGNOSIS — Y9289 Other specified places as the place of occurrence of the external cause: Secondary | ICD-10-CM | POA: Insufficient documentation

## 2015-09-20 DIAGNOSIS — Z91040 Latex allergy status: Secondary | ICD-10-CM | POA: Insufficient documentation

## 2015-09-20 DIAGNOSIS — E669 Obesity, unspecified: Secondary | ICD-10-CM | POA: Insufficient documentation

## 2015-09-20 DIAGNOSIS — Z8719 Personal history of other diseases of the digestive system: Secondary | ICD-10-CM | POA: Insufficient documentation

## 2015-09-20 DIAGNOSIS — E785 Hyperlipidemia, unspecified: Secondary | ICD-10-CM | POA: Insufficient documentation

## 2015-09-20 DIAGNOSIS — Z79899 Other long term (current) drug therapy: Secondary | ICD-10-CM | POA: Diagnosis not present

## 2015-09-20 DIAGNOSIS — Y998 Other external cause status: Secondary | ICD-10-CM | POA: Insufficient documentation

## 2015-09-20 DIAGNOSIS — S3992XA Unspecified injury of lower back, initial encounter: Secondary | ICD-10-CM | POA: Diagnosis present

## 2015-09-20 DIAGNOSIS — G8929 Other chronic pain: Secondary | ICD-10-CM | POA: Diagnosis not present

## 2015-09-20 DIAGNOSIS — I509 Heart failure, unspecified: Secondary | ICD-10-CM | POA: Diagnosis not present

## 2015-09-20 DIAGNOSIS — F329 Major depressive disorder, single episode, unspecified: Secondary | ICD-10-CM | POA: Insufficient documentation

## 2015-09-20 DIAGNOSIS — F419 Anxiety disorder, unspecified: Secondary | ICD-10-CM | POA: Insufficient documentation

## 2015-09-20 DIAGNOSIS — Z87440 Personal history of urinary (tract) infections: Secondary | ICD-10-CM | POA: Diagnosis not present

## 2015-09-20 DIAGNOSIS — Y9301 Activity, walking, marching and hiking: Secondary | ICD-10-CM | POA: Insufficient documentation

## 2015-09-20 DIAGNOSIS — W1839XA Other fall on same level, initial encounter: Secondary | ICD-10-CM | POA: Insufficient documentation

## 2015-09-20 DIAGNOSIS — W19XXXA Unspecified fall, initial encounter: Secondary | ICD-10-CM

## 2015-09-20 MED ORDER — MORPHINE SULFATE (PF) 4 MG/ML IV SOLN
4.0000 mg | Freq: Once | INTRAVENOUS | Status: AC
  Administered 2015-09-20: 4 mg via INTRAVENOUS
  Filled 2015-09-20: qty 1

## 2015-09-20 NOTE — ED Notes (Signed)
Bladder scan shows 26mL in bladder post void.

## 2015-09-20 NOTE — ED Notes (Signed)
Darlene, Diplomatic Services operational officersecretary called for [employer].

## 2015-09-20 NOTE — ED Notes (Signed)
[facility] Phone: [phone] and gave report to Morrie Sheldonshley, med tech.
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
## 2015-09-20 NOTE — ED Notes (Signed)
EMS took Fentanyl patch off patient.

## 2015-09-20 NOTE — ED Notes (Signed)
Advised pt [facility], pt advised that his son can possibly come get him.  Called Christoper Sisley, he will come and get patient.  Becky, Diplomatic Services operational officersecretary will cancel [employer].
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
## 2015-09-20 NOTE — ED Notes (Signed)
Pt requesting I/O cath to be done.  Has trouble urinating and is unable to urinate laying in bed.

## 2015-09-20 NOTE — ED Provider Notes (Signed)
CSN: [id number]     Arrival date & time 09/20/15  1415 History   First MD Initiated Contact with Patient 09/20/15 1418     Chief Complaint  Patient presents with  . Fall  . Back Pain     (Consider location/radiation/quality/duration/timing/severity/associated sxs/prior Treatment) HPI Patient presents to the emergency department with a fall that occurred prior to arrival.  Patient states he was recently started back on his fentanyl patches.  Patient states that he does have an adjustment to the patches.  He states that he has had this happen before after starting these fentanyl patches.  The patient states that he did not lose consciousness.  Patient states that he is having pain in his bilateral hips, lower back and neck, but he states is his areas of chronic pain.  He states he normally walks with a walker with a walker was outside of his room and was not readily available to him to use. The patient denies chest pain, shortness of breath, headache,blurred vision, fever, cough, weakness, numbness, dizziness, anorexia, edema, abdominal pain, nausea, vomiting, diarrhea, rash,  dysuria, hematemesis, bloody stool, near syncope, or syncope. Past Medical History  Diagnosis Date  . Hyperlipemia   . Chronic back pain   . GERD (gastroesophageal reflux disease)   . Anxiety   . Chronic narcotic dependence (HCC)   . Hypothyroidism   . Depression   . Myoclonus   . UTI (lower urinary tract infection) 5/14    [place]  Marland Kitchen. Acute encephalopathy 09/06/12    [facility]  . Foot drop, bilateral   . Shortness of breath     with exertion  . CHF (congestive heart failure) (HCC)     diurects stopped in April  . DVT (deep venous thrombosis) (HCC)     01/2012  . Seizures (HCC)     Myoclonus  . Neuropathy (HCC)     legs  . Arthritis   . Memory loss of unknown cause   . AVN (avascular necrosis of bone) (HCC)     right  . Obese   . History of cerebrovascular disease   .  Abnormality of gait 08/03/2013  . Cervicogenic headache 10/26/2013   Past Surgical History  Procedure Laterality Date  . Back surgery      4 lumbar  . Hip surgery Left   . Cervical fusion    . Knee arthroscopy Right   . Joint replacement    . Pain pump implantation  Reports no longer has pump  . Tooth extraction N/A 11/16/2012    Procedure: DENTAL EXTRACTIONS OF MULTIPLE NONRESTORABLE TEETH;  Surgeon: Hinton DyerJoseph L Miller, DDS;  Location: [facility] OR;  Service: Oral Surgery;  Laterality: N/A;  #12, 14, 15, 22, 23, 24, 25, 26, 27, 28   Family History  Problem Relation Age of Onset  . CAD Father   . Heart disease Father   . Heart failure Mother   . Breast cancer Sister   . CAD Brother    Social History  Substance Use Topics  . Smoking status: Never Smoker   . Smokeless tobacco: Never Used  . Alcohol Use: No     Comment: OCCASSIONAL    Review of Systems  All other systems negative except as documented in the HPI. All pertinent positives and negatives as reviewed in the HPI.  Allergies  Iodinated diagnostic agents; Latex; Statins; and Ceftin  Home Medications   Prior to Admission medications   Medication  Sig Start Date End Date Taking? Authorizing Provider  carisoprodol (SOMA) 350 MG tablet TAKE 1 TABLET BY MOUTH 3 TIMES A DAY 11/22/14   Historical Provider, MD  diazepam (VALIUM) 5 MG tablet Take 5 mg by mouth every 6 (six) hours.  12/13/14   Historical Provider, MD  diazepam (VALIUM) 5 MG tablet Take 5 mg by mouth every 6 (six) hours as needed (seizure activity).    Historical Provider, MD  diphenhydrAMINE (SOMINEX) 25 MG tablet Take 25 mg by mouth at bedtime as needed for sleep.    Historical Provider, MD  docusate sodium (COLACE) 100 MG capsule Take 100 mg by mouth daily.    Historical Provider, MD  DULoxetine (CYMBALTA) 60 MG capsule Take 2 capsules (120 mg total) by mouth every morning. 01/06/15   York Spaniel, MD  ezetimibe (ZETIA) 10 MG tablet Take 10 mg by mouth daily.     Historical Provider, MD  levETIRAcetam (KEPPRA) 1000 MG tablet Take 1.5 tablets in the AM and 1 tablet in the PM 09/11/15   Butch Penny, NP  Menthol, Topical Analgesic, (BIOFREEZE) 4 % GEL Apply 1 application topically 3 (three) times daily as needed (pain).    Historical Provider, MD  oxymorphone (OPANA ER) 30 MG 12 hr tablet Take 30 mg by mouth every 8 (eight) hours.    Historical Provider, MD  oxymorphone (OPANA) 10 MG tablet Take 10 mg by mouth every 6 (six) hours as needed for pain.    Historical Provider, MD  propranolol (INDERAL) 20 MG tablet TAKE 1 TABLET BY MOUTH TWICE A DAY 07/15/15   York Spaniel, MD   BP 133/78 mmHg  Pulse 68  Temp(Src) 98 F (36.7 C) (Oral)  Resp 21  Ht 5\' 10"  (1.778 m)  SpO2 94% Physical Exam  Constitutional: He is oriented to person, place, and time. He appears well-developed and well-nourished. No distress.  HENT:  Head: Normocephalic and atraumatic.  Mouth/Throat: Oropharynx is clear and moist.  Eyes: Pupils are equal, round, and reactive to light.  Neck: Normal range of motion. Neck supple.  Cardiovascular: Normal rate, regular rhythm and normal heart sounds.  Exam reveals no gallop and no friction rub.   No murmur heard. Pulmonary/Chest: Effort normal and breath sounds normal. No respiratory distress. He has no wheezes.  Abdominal: Soft. Bowel sounds are normal. He exhibits no distension. There is no tenderness.  Musculoskeletal:       Back:       Legs: Neurological: He is alert and oriented to person, place, and time. He exhibits normal muscle tone. Coordination normal.  Skin: Skin is warm and dry. No rash noted. No erythema.  Psychiatric: He has a normal mood and affect. His behavior is normal.  Nursing note and vitals reviewed.   ED Course  Procedures (including critical care time) Labs Review Labs Reviewed - No data to display  Imaging Review Dg Lumbar Spine Complete  09/20/2015  CLINICAL DATA:  Lower back pain radiating to the left  hip EXAM: LUMBAR SPINE - COMPLETE 4+ VIEW COMPARISON:  03/22/2015 FINDINGS: There are 5 nonrib bearing lumbar-type vertebral bodies. There is a chronic T12 vertebral body compression fracture. The remainder the vertebral body heights are maintained. The alignment is anatomic. There is no spondylolysis. There is no static listhesis. There is no acute fracture. Degenerative disc disease throughout the lumbar spine most severe at L4-5 with disc space narrowing. Bilateral facet arthropathy at L3-4, L4-5 and L5-S1. There is a spinal pain pump catheter  noted within the lumbar spine. The SI joints are unremarkable. IMPRESSION: 1.  No acute osseous injury of the lumbar spine. 2. Lumbar spine spondylosis as described above unchanged from 03/22/2015 Electronically Signed   By: Elige Ko   On: 09/20/2015 17:02   Ct Head Wo Contrast  09/20/2015  CLINICAL DATA:  Fall today with headaches and loss of consciousness EXAM: CT HEAD WITHOUT CONTRAST TECHNIQUE: Contiguous axial images were obtained from the base of the skull through the vertex without intravenous contrast. COMPARISON:  03/22/2015 FINDINGS: Bony calvarium is intact. Diffuse atrophic changes are noted. No findings to suggest acute hemorrhage, acute infarction or space-occupying mass lesion are noted. IMPRESSION: Mild atrophic changes without acute abnormality. Electronically Signed   By: Alcide Clever M.D.   On: 09/20/2015 17:39   Ct Cervical Spine Wo Contrast  09/20/2015  CLINICAL DATA:  Larey Seat.  Cervical pain and left shoulder pain. EXAM: CT CERVICAL SPINE WITHOUT CONTRAST TECHNIQUE: Multidetector CT imaging of the cervical spine was performed without intravenous contrast. Multiplanar CT image reconstructions were also generated. COMPARISON:  03/22/2015 FINDINGS: Foramen magnum is widely patent. There is ordinary osteoarthritis of the C1-2 articulation but no stenosis. C2-3:  Mild facet degeneration.  Anterior osteophytes.  No stenosis. C3-4: Facet degeneration  left more than right. Uncovertebral osteophytes than the left. Mild foraminal stenosis on the left because of osteophytic encroachment. C4-5: Spondylosis with endplate osteophytes. Foraminal encroachment by osteophytes left more than right. C5-6: Spondylosis. Endplate osteophytes more prominent towards the right. No compressive stenosis. C6-7: Fusion across the disc space. Wide patency of the canal and foramina. C7-T1: Facet arthropathy left worse than right. Mild foraminal encroachment on the left by osteophytes. Upper thoracic region: No significant finding. IMPRESSION: No change since the previous study. No acute or traumatic finding. No compressive central canal stenosis. Foraminal narrowing because of osteophytic encroachment on the left at C3-4, bilaterally at C4-5 and on the left at C7-T1. Electronically Signed   By: Paulina Fusi M.D.   On: 09/20/2015 17:04   Dg Hips Bilat With Pelvis 2v  09/20/2015  CLINICAL DATA:  Left hip pain feeling achy. EXAM: DG HIP (WITH OR WITHOUT PELVIS) 2V BILAT COMPARISON:  None. FINDINGS: Total left hip arthroplasty without failure complication. No acute fracture or dislocation. Moderate osteoarthritis of the right hip. Generalized osteopenia. Mild degenerative changes of bilateral SI joints. Degenerative disc disease with disc height loss at L4-5 and L5-S1. IMPRESSION: Total left hip arthroplasty without failure or complication. Electronically Signed   By: Elige Ko   On: 09/20/2015 16:52   I have personally reviewed and evaluated these images and lab results as part of my medical decision-making.   EKG Interpretation   Date/Time:  Saturday Sep 20 2015 14:26:30 EDT Ventricular Rate:  73 PR Interval:  167 QRS Duration: 92 QT Interval:  391 QTC Calculation: 431 R Axis:   51 Text Interpretation:  Sinus rhythm No significant change was found  Confirmed by Manus Gunning  MD, STEPHEN [phone]) on 09/20/2015 2:32:19 PM      MDM   Final diagnoses:  None       Patient will be discharged home, told to follow up with his primary care doctor.  Patient agrees the plan and all questions were answered  Charlestine Night, PA-C 09/21/15 1932  Glynn Octave, MD 09/22/15 [phone]

## 2015-09-20 NOTE — Discharge Instructions (Signed)
Return here as needed. Follow up with your doctor. °

## 2015-09-20 NOTE — ED Notes (Signed)
Pt up out of bed and ambulated into hall, this nurse escorted pt to bathroom and advised him to pull call light when done and this nurse will assist pt back to room.

## 2015-09-25 ENCOUNTER — Encounter (HOSPITAL_COMMUNITY): Payer: Self-pay | Admitting: Emergency Medicine

## 2015-09-25 ENCOUNTER — Emergency Department (HOSPITAL_COMMUNITY)
Admission: EM | Admit: 2015-09-25 | Discharge: 2015-09-25 | Disposition: A | Discharge status: Home / Self Care | Payer: Medicare Other | Attending: Emergency Medicine | Admitting: Emergency Medicine

## 2015-09-25 ENCOUNTER — Emergency Department (HOSPITAL_COMMUNITY): Payer: Medicare Other

## 2015-09-25 DIAGNOSIS — K219 Gastro-esophageal reflux disease without esophagitis: Secondary | ICD-10-CM | POA: Diagnosis not present

## 2015-09-25 DIAGNOSIS — Y939 Activity, unspecified: Secondary | ICD-10-CM | POA: Insufficient documentation

## 2015-09-25 DIAGNOSIS — Y92129 Unspecified place in nursing home as the place of occurrence of the external cause: Secondary | ICD-10-CM | POA: Diagnosis not present

## 2015-09-25 DIAGNOSIS — Z79899 Other long term (current) drug therapy: Secondary | ICD-10-CM | POA: Diagnosis not present

## 2015-09-25 DIAGNOSIS — E669 Obesity, unspecified: Secondary | ICD-10-CM | POA: Insufficient documentation

## 2015-09-25 DIAGNOSIS — R0789 Other chest pain: Secondary | ICD-10-CM | POA: Insufficient documentation

## 2015-09-25 DIAGNOSIS — Z91040 Latex allergy status: Secondary | ICD-10-CM | POA: Insufficient documentation

## 2015-09-25 DIAGNOSIS — Y999 Unspecified external cause status: Secondary | ICD-10-CM | POA: Insufficient documentation

## 2015-09-25 DIAGNOSIS — Z96659 Presence of unspecified artificial knee joint: Secondary | ICD-10-CM | POA: Diagnosis not present

## 2015-09-25 DIAGNOSIS — E039 Hypothyroidism, unspecified: Secondary | ICD-10-CM | POA: Diagnosis not present

## 2015-09-25 DIAGNOSIS — W03XXXA Other fall on same level due to collision with another person, initial encounter: Secondary | ICD-10-CM | POA: Insufficient documentation

## 2015-09-25 DIAGNOSIS — E785 Hyperlipidemia, unspecified: Secondary | ICD-10-CM | POA: Insufficient documentation

## 2015-09-25 DIAGNOSIS — F329 Major depressive disorder, single episode, unspecified: Secondary | ICD-10-CM | POA: Insufficient documentation

## 2015-09-25 DIAGNOSIS — S20301A Unspecified superficial injuries of right front wall of thorax, initial encounter: Secondary | ICD-10-CM | POA: Diagnosis present

## 2015-09-25 DIAGNOSIS — I509 Heart failure, unspecified: Secondary | ICD-10-CM | POA: Diagnosis not present

## 2015-09-25 DIAGNOSIS — M1990 Unspecified osteoarthritis, unspecified site: Secondary | ICD-10-CM | POA: Insufficient documentation

## 2015-09-25 DIAGNOSIS — W19XXXA Unspecified fall, initial encounter: Secondary | ICD-10-CM

## 2015-09-25 MED ORDER — FENTANYL 25 MCG/HR TD PT72
25.0000 ug | MEDICATED_PATCH | TRANSDERMAL | Status: DC
  Administered 2015-09-25: 25 ug via TRANSDERMAL
  Filled 2015-09-25: qty 1

## 2015-09-25 NOTE — ED Notes (Signed)
Pt also has abrasion to R cheek. Does not recollect hitting head.
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
## 2015-09-25 NOTE — Discharge Instructions (Signed)
As discussed, your evaluation today has been largely reassuring.  But, it is important that you monitor your condition carefully, and do not hesitate to return to the ED if you develop new, or concerning changes in your condition. ° °Otherwise, please follow-up with your physician for appropriate ongoing care. ° °Cryotherapy °Cryotherapy means treatment with cold. Ice or gel packs can be used to reduce both pain and swelling. Ice is the most helpful within the first 24 to 48 hours after an injury or flare-up from overusing a muscle or joint. Sprains, strains, spasms, burning pain, shooting pain, and aches can all be eased with ice. Ice can also be used when recovering from surgery. Ice is effective, has very few side effects, and is safe for most people to use. °PRECAUTIONS  °Ice is not a safe treatment option for people with: °· Raynaud phenomenon. This is a condition affecting small blood vessels in the extremities. Exposure to cold may cause your problems to return. °· Cold hypersensitivity. There are many forms of cold hypersensitivity, including: °¨ Cold urticaria. Red, itchy hives appear on the skin when the tissues begin to warm after being iced. °¨ Cold erythema. This is a red, itchy rash caused by exposure to cold. °¨ Cold hemoglobinuria. Red blood cells break down when the tissues begin to warm after being iced. The hemoglobin that carry oxygen are passed into the urine because they cannot combine with blood proteins fast enough. °· Numbness or altered sensitivity in the area being iced. °If you have any of the following conditions, do not use ice until you have discussed cryotherapy with your caregiver: °· Heart conditions, such as arrhythmia, angina, or chronic heart disease. °· High blood pressure. °· Healing wounds or open skin in the area being iced. °· Current infections. °· Rheumatoid arthritis. °· Poor circulation. °· Diabetes. °Ice slows the blood flow in the region it is applied. This is  beneficial when trying to stop inflamed tissues from spreading irritating chemicals to surrounding tissues. However, if you expose your skin to cold temperatures for too long or without the proper protection, you can damage your skin or nerves. Watch for signs of skin damage due to cold. °HOME CARE INSTRUCTIONS °Follow these tips to use ice and cold packs safely. °· Place a dry or damp towel between the ice and skin. A damp towel will cool the skin more quickly, so you may need to shorten the time that the ice is used. °· For a more rapid response, add gentle compression to the ice. °· Ice for no more than 10 to 20 minutes at a time. The bonier the area you are icing, the less time it will take to get the benefits of ice. °· Check your skin after 5 minutes to make sure there are no signs of a poor response to cold or skin damage. °· Rest 20 minutes or more between uses. °· Once your skin is numb, you can end your treatment. You can test numbness by very lightly touching your skin. The touch should be so light that you do not see the skin dimple from the pressure of your fingertip. When using ice, most people will feel these normal sensations in this order: cold, burning, aching, and numbness. °· Do not use ice on someone who cannot communicate their responses to pain, such as small children or people with dementia. °HOW TO MAKE AN ICE PACK °Ice packs are the most common way to use ice therapy. Other methods include ice   massage, ice baths, and cryosprays. Muscle creams that cause a cold, tingly feeling do not offer the same benefits that ice offers and should not be used as a substitute unless recommended by your caregiver. °To make an ice pack, do one of the following: °· Place crushed ice or a bag of frozen vegetables in a sealable plastic bag. Squeeze out the excess air. Place this bag inside another plastic bag. Slide the bag into a pillowcase or place a damp towel between your skin and the bag. °· Mix 3 parts  water with 1 part rubbing alcohol. Freeze the mixture in a sealable plastic bag. When you remove the mixture from the freezer, it will be slushy. Squeeze out the excess air. Place this bag inside another plastic bag. Slide the bag into a pillowcase or place a damp towel between your skin and the bag. °SEEK MEDICAL CARE IF: °· You develop white spots on your skin. This may give the skin a blotchy (mottled) appearance. °· Your skin turns blue or pale. °· Your skin becomes waxy or hard. °· Your swelling gets worse. °MAKE SURE YOU:  °· Understand these instructions. °· Will watch your condition. °· Will get help right away if you are not doing well or get worse. °  °This information is not intended to replace advice given to you by your health care provider. Make sure you discuss any questions you have with your health care provider. °  °Document Released: 12/21/2010 Document Revised: 05/17/2014 Document Reviewed: 12/21/2010 °Elsevier Interactive Patient Education ©2016 [employer] ° °

## 2015-09-25 NOTE — ED Notes (Signed)
MD at bedside. 

## 2015-09-25 NOTE — ED Notes (Signed)
[facility]tar arrived . 

## 2015-09-25 NOTE — ED Provider Notes (Signed)
CSN: [id number]     Arrival date & time 09/25/15  1884 History   First MD Initiated Contact with Patient 09/25/15 0740     Chief Complaint  Patient presents with  . Fall  . Chest Injury     (Consider location/radiation/quality/duration/timing/severity/associated sxs/prior Treatment) HPI Patient presents after mechanical fall. Fall night just prior to ED arrival. Patient is a nursing home resident, with history of prior stroke, chronic pain, unsteady ambulation. He notes that while trying to ambulate around a wheelchair he fell striking his right chest. He also struck his right face. Since that time there's been minor pain in the right lateral face, but severe persistent pain in the right anterior chest wall. Pain is worse with inspiration, palpation. Pain is not appreciably changed in spite of using multiple home medications for chronic pain, including narcotics. No new syncope, confusion, disorientation, nausea, vomiting.  Past Medical History  Diagnosis Date  . Hyperlipemia   . Chronic back pain   . GERD (gastroesophageal reflux disease)   . Anxiety   . Chronic narcotic dependence (HCC)   . Hypothyroidism   . Depression   . Myoclonus   . UTI (lower urinary tract infection) 5/14    was confused and was found in [place].  Marland Kitchen Acute encephalopathy 09/06/12    was admitted to [facility]  . Foot drop, bilateral   . Shortness of breath     with exertion  . CHF (congestive heart failure) (HCC)     diurects stopped in April  . DVT (deep venous thrombosis) (HCC)     01/2012  . Seizures (HCC)     Myoclonus  . Neuropathy (HCC)     legs  . Arthritis   . Memory loss of unknown cause   . AVN (avascular necrosis of bone) (HCC)     right  . Obese   . History of cerebrovascular disease   . Abnormality of gait 08/03/2013  . Cervicogenic headache 10/26/2013   Past Surgical History  Procedure Laterality Date  . Back surgery      4 lumbar  . Hip surgery Left   . Cervical fusion    . Knee  arthroscopy Right   . Joint replacement    . Pain pump implantation  Reports no longer has pump  . Tooth extraction N/A 11/16/2012    Procedure: DENTAL EXTRACTIONS OF MULTIPLE NONRESTORABLE TEETH;  Surgeon: Hinton Dyer, DDS;  Location: [facility] OR;  Service: Oral Surgery;  Laterality: N/A;  #12, 14, 15, 22, 23, 24, 25, 26, 27, 28   Family History  Problem Relation Age of Onset  . CAD Father   . Heart disease Father   . Heart failure Mother   . Breast cancer Sister   . CAD Brother    Social History  Substance Use Topics  . Smoking status: Never Smoker   . Smokeless tobacco: Never Used  . Alcohol Use: No     Comment: OCCASSIONAL    Review of Systems  Constitutional:       Per HPI, otherwise negative  HENT:       Per HPI, otherwise negative  Respiratory:       Per HPI, otherwise negative  Cardiovascular:       Per HPI, otherwise negative  Gastrointestinal: Negative for vomiting.  Endocrine:       Negative aside from HPI  Genitourinary:       Neg aside from HPI   Musculoskeletal:       Per  HPI, otherwise negative  Skin: Positive for color change and wound.  Neurological: Positive for weakness. Negative for syncope.      Allergies  Iodinated diagnostic agents; Latex; Statins; and Ceftin  Home Medications   Prior to Admission medications   Medication Sig Start Date End Date Taking? Authorizing Provider  carisoprodol (SOMA) 350 MG tablet TAKE 1 TABLET BY MOUTH 3 TIMES A DAY 11/22/14  Yes Historical Provider, MD  diazepam (VALIUM) 5 MG tablet Take 5 mg by mouth every 6 (six) hours.  12/13/14  Yes Historical Provider, MD  docusate sodium (COLACE) 100 MG capsule Take 100 mg by mouth 2 (two) times daily as needed.    Yes Historical Provider, MD  DULoxetine (CYMBALTA) 60 MG capsule Take 2 capsules (120 mg total) by mouth every morning. 01/06/15  Yes York Spanielharles K Willis, MD  ezetimibe (ZETIA) 10 MG tablet Take 10 mg by mouth daily.   Yes Historical Provider, MD  fentaNYL (DURAGESIC -  DOSED MCG/HR) 25 MCG/HR patch Place 25 mcg onto the skin every 3 (three) days. Currently has patch on. Needs to be changed today.   Yes Historical Provider, MD  levETIRAcetam (KEPPRA) 1000 MG tablet Take 1.5 tablets in the AM and 1 tablet in the PM 09/11/15  Yes Butch PennyMegan Millikan, NP  Menthol, Topical Analgesic, (BIOFREEZE) 4 % GEL Apply 1 application topically 2 (two) times daily as needed (pain).    Yes Historical Provider, MD  Multiple Vitamin (MULTIVITAMIN WITH MINERALS) TABS tablet Take 1 tablet by mouth daily.   Yes Historical Provider, MD  oxymorphone (OPANA) 10 MG tablet Take 10 mg by mouth at bedtime as needed for pain.    Yes Historical Provider, MD  promethazine (PHENERGAN) 25 MG tablet Take 25 mg by mouth daily as needed. 09/19/15 09/26/15 Yes Historical Provider, MD  propranolol (INDERAL) 20 MG tablet TAKE 1 TABLET BY MOUTH TWICE A DAY 07/15/15  Yes York Spanielharles K Willis, MD   BP 129/81 mmHg  Pulse 68  Temp(Src) 97.5 F (36.4 C) (Oral)  Resp 16  SpO2 92% Physical Exam  Constitutional: He is oriented to person, place, and time. He appears well-developed. No distress.  Obese, sickly appearing elderly male  HENT:  Head: Normocephalic and atraumatic.    Eyes: Conjunctivae and EOM are normal.  Neck: Full passive range of motion without pain. Neck supple. No rigidity. No edema and normal range of motion present.  Cardiovascular: Normal rate and regular rhythm.   Pulmonary/Chest: Effort normal. No stridor. No respiratory distress.    Abdominal: He exhibits no distension.  Musculoskeletal: He exhibits no edema.  Neurological: He is alert and oriented to person, place, and time.  Skin: Skin is warm and dry.  Psychiatric: He has a normal mood and affect.  Nursing note and vitals reviewed.   ED Course  Procedures (including critical care time) Labs Review Labs Reviewed - No data to display  Imaging Review Dg Ribs Unilateral W/chest Right  09/25/2015  CLINICAL DATA:  Pain following fall  EXAM: RIGHT RIBS AND CHEST - 3+ VIEW COMPARISON:  Chest radiograph March 22, 2015 FINDINGS: Frontal chest as well as oblique and cone-down lower rib images obtained. There is no edema or consolidation. Heart size is upper normal with pulmonary vascularity within normal limits. No adenopathy. There is an azygos lobe on the right, an anatomic variant. There is no demonstrable pneumothorax or pleural effusion. There is no apparent rib fracture. IMPRESSION: No demonstrable rib fracture.  No edema or consolidation. Electronically Signed  By: Bretta Bang III M.D.   On: 09/25/2015 08:37   I have personally reviewed and evaluated these images and lab results as part of my medical decision-making.  9:15 AM Patient in no distress.  He is aware of all findings.  Ice therapy and new fentanyl patch advised.  MDM  Patient presents after a fall that occurred yesterday with ongoing pain around a wound on the right anterior thorax. No evidence for fracture, pulmonary contusion, hypoxia, pneumothorax. Patient is on chronic narcotics, including fentanyl patch. This was replaced, and the patient was advised to continue using these, in addition to ice packs for pain relief. Patient discharged in stable condition.  Gerhard Munch, MD 09/25/15 [phone]
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
## 2015-10-07 ENCOUNTER — Emergency Department (HOSPITAL_COMMUNITY): Payer: Medicare Other

## 2015-10-07 ENCOUNTER — Emergency Department (HOSPITAL_COMMUNITY)
Admission: EM | Admit: 2015-10-07 | Discharge: 2015-10-08 | Disposition: A | Discharge status: Home / Self Care | Payer: Medicare Other | Attending: Emergency Medicine | Admitting: Emergency Medicine

## 2015-10-07 DIAGNOSIS — G8929 Other chronic pain: Secondary | ICD-10-CM | POA: Insufficient documentation

## 2015-10-07 DIAGNOSIS — Y929 Unspecified place or not applicable: Secondary | ICD-10-CM | POA: Insufficient documentation

## 2015-10-07 DIAGNOSIS — Z79899 Other long term (current) drug therapy: Secondary | ICD-10-CM | POA: Diagnosis not present

## 2015-10-07 DIAGNOSIS — R569 Unspecified convulsions: Secondary | ICD-10-CM

## 2015-10-07 DIAGNOSIS — Z91040 Latex allergy status: Secondary | ICD-10-CM | POA: Insufficient documentation

## 2015-10-07 DIAGNOSIS — I509 Heart failure, unspecified: Secondary | ICD-10-CM | POA: Diagnosis not present

## 2015-10-07 DIAGNOSIS — W19XXXA Unspecified fall, initial encounter: Secondary | ICD-10-CM | POA: Diagnosis not present

## 2015-10-07 DIAGNOSIS — Y939 Activity, unspecified: Secondary | ICD-10-CM | POA: Insufficient documentation

## 2015-10-07 DIAGNOSIS — Y999 Unspecified external cause status: Secondary | ICD-10-CM | POA: Insufficient documentation

## 2015-10-07 DIAGNOSIS — Z96659 Presence of unspecified artificial knee joint: Secondary | ICD-10-CM | POA: Insufficient documentation

## 2015-10-07 DIAGNOSIS — S2241XA Multiple fractures of ribs, right side, initial encounter for closed fracture: Secondary | ICD-10-CM | POA: Insufficient documentation

## 2015-10-07 DIAGNOSIS — Z8673 Personal history of transient ischemic attack (TIA), and cerebral infarction without residual deficits: Secondary | ICD-10-CM | POA: Diagnosis not present

## 2015-10-07 DIAGNOSIS — G40909 Epilepsy, unspecified, not intractable, without status epilepticus: Secondary | ICD-10-CM | POA: Diagnosis present

## 2015-10-07 DIAGNOSIS — M545 Low back pain: Secondary | ICD-10-CM | POA: Insufficient documentation

## 2015-10-07 DIAGNOSIS — R109 Unspecified abdominal pain: Secondary | ICD-10-CM | POA: Diagnosis not present

## 2015-10-07 DIAGNOSIS — M549 Dorsalgia, unspecified: Secondary | ICD-10-CM

## 2015-10-07 NOTE — ED Notes (Signed)
MD at bedside. 

## 2015-10-07 NOTE — ED Notes (Signed)
[facility] Expected date:  Expected time:  Means of arrival:  Comments: Back pain/seizures

## 2015-10-07 NOTE — ED Notes (Signed)
Pt BIB by [facility]. Facility called stating that pt was experiencing a seizure. EMS witnessed the behavior and did not feel it was seizure like. Pt is c/o R lower back pain. Last dose of oxymorphone 10mg  at 2115 this evening. Facility just gave him a new fentanyl patch today. Pt appears lethargic and sleepy. No grimacing during transfer. Pt was able to stand and sit on the side of the bed for transfer. Complaining of 10/10 pain. EMS states that pt sats at 91% when he falls asleep so they placed him on 2 L. 93% on RA upon arrival.

## 2015-10-08 ENCOUNTER — Emergency Department (HOSPITAL_COMMUNITY): Payer: Medicare Other

## 2015-10-08 DIAGNOSIS — S2241XA Multiple fractures of ribs, right side, initial encounter for closed fracture: Secondary | ICD-10-CM | POA: Diagnosis not present

## 2015-10-08 LAB — CBC WITH DIFFERENTIAL/PLATELET
Basophils Absolute: 0 10*3/uL (ref 0.0–0.1)
Basophils Relative: 0 %
EOS PCT: 1 %
Eosinophils Absolute: 0.1 10*3/uL (ref 0.0–0.7)
HCT: 43.1 % (ref 39.0–52.0)
HEMOGLOBIN: 14.5 g/dL (ref 13.0–17.0)
LYMPHS ABS: 3.3 10*3/uL (ref 0.7–4.0)
LYMPHS PCT: 35 %
MCH: 29.6 pg (ref 26.0–34.0)
MCHC: 33.6 g/dL (ref 30.0–36.0)
MCV: 88 fL (ref 78.0–100.0)
Monocytes Absolute: 0.9 10*3/uL (ref 0.1–1.0)
Monocytes Relative: 9 %
Neutro Abs: 5.2 10*3/uL (ref 1.7–7.7)
Neutrophils Relative %: 55 %
PLATELETS: 186 10*3/uL (ref 150–400)
RBC: 4.9 MIL/uL (ref 4.22–5.81)
RDW: 13.1 % (ref 11.5–15.5)
WBC: 9.4 10*3/uL (ref 4.0–10.5)

## 2015-10-08 LAB — COMPREHENSIVE METABOLIC PANEL
ALK PHOS: 144 U/L — AB (ref 38–126)
ALT: 20 U/L (ref 17–63)
AST: 25 U/L (ref 15–41)
Albumin: 3.8 g/dL (ref 3.5–5.0)
Anion gap: 4 — ABNORMAL LOW (ref 5–15)
BILIRUBIN TOTAL: 0.8 mg/dL (ref 0.3–1.2)
BUN: 10 mg/dL (ref 6–20)
CALCIUM: 8.9 mg/dL (ref 8.9–10.3)
CO2: 29 mmol/L (ref 22–32)
CREATININE: 0.89 mg/dL (ref 0.61–1.24)
Chloride: 105 mmol/L (ref 101–111)
Glucose, Bld: 125 mg/dL — ABNORMAL HIGH (ref 65–99)
Potassium: 4.1 mmol/L (ref 3.5–5.1)
Sodium: 138 mmol/L (ref 135–145)
TOTAL PROTEIN: 6.7 g/dL (ref 6.5–8.1)

## 2015-10-08 LAB — URINALYSIS, ROUTINE W REFLEX MICROSCOPIC
BILIRUBIN URINE: NEGATIVE
Glucose, UA: NEGATIVE mg/dL
Hgb urine dipstick: NEGATIVE
KETONES UR: NEGATIVE mg/dL
Leukocytes, UA: NEGATIVE
NITRITE: NEGATIVE
Protein, ur: NEGATIVE mg/dL
Specific Gravity, Urine: 1.01 (ref 1.005–1.030)
pH: 5.5 (ref 5.0–8.0)

## 2015-10-08 MED ORDER — SODIUM CHLORIDE 0.9 % IV SOLN
1500.0000 mg | Freq: Once | INTRAVENOUS | Status: AC
  Administered 2015-10-08: 1500 mg via INTRAVENOUS
  Filled 2015-10-08: qty 15

## 2015-10-08 NOTE — ED Provider Notes (Signed)
CSN: [id number]     Arrival date & time 10/07/15  2232 History   First MD Initiated Contact with Patient 10/07/15 2259     Chief Complaint  Patient presents with  . Back Pain  . Seizures     (Consider location/radiation/quality/duration/timing/severity/associated sxs/prior Treatment) HPI Comments: 65 year old male with history of seizures, chronic back pain presents for seizure. The patient was sent in from his nursing facility after reportedly having seizure-like activity there. The patient reports that recently his Keppra has been adjusted. He reports that they decreased his dose. On review of his recent neurology note they actually increase the patient's dose to 1500 mg in the morning and 1000 mg at night.  The patient also reports increased lower back pain from his chronic pain. He said this happened after the episode of seizure-like activity. He reports that he had been on the toilet before the seizure and had called the bowel because he had felt like he needed assistance back to his bed when he got back in his bad as when he had the seizure. Currently denies headache. Denies recent illness. No injury to his back. Reports normal urinary patterns.  Patient is a 65 y.o. male presenting with back pain and seizures.  Back Pain Associated symptoms: no abdominal pain, no chest pain, no dysuria, no fever, no headaches, no numbness and no weakness   Seizures   Past Medical History  Diagnosis Date  . Hyperlipemia   . Chronic back pain   . GERD (gastroesophageal reflux disease)   . Anxiety   . Chronic narcotic dependence (HCC)   . Hypothyroidism   . Depression   . Myoclonus   . UTI (lower urinary tract infection) 5/14    [place]  [facility]. Acute encephalopathy 09/06/12    [facility]  . Foot drop, bilateral   . Shortness of breath     with exertion  . CHF (congestive heart failure) (HCC)     diurects stopped in April  . DVT (deep venous thrombosis) (HCC)    01/2012  . Seizures (HCC)     Myoclonus  . Neuropathy (HCC)     legs  . Arthritis   . Memory loss of unknown cause   . AVN (avascular necrosis of bone) (HCC)     right  . Obese   . History of cerebrovascular disease   . Abnormality of gait 08/03/2013  . Cervicogenic headache 10/26/2013   Past Surgical History  Procedure Laterality Date  . Back surgery      4 lumbar  . Hip surgery Left   . Cervical fusion    . Knee arthroscopy Right   . Joint replacement    . Pain pump implantation  Reports no longer has pump  . Tooth extraction N/A 11/16/2012    Procedure: DENTAL EXTRACTIONS OF MULTIPLE NONRESTORABLE TEETH;  Surgeon: Hinton DyerJoseph L Miller, DDS;  Location: [facility] OR;  Service: Oral Surgery;  Laterality: N/A;  #12, 14, 15, 22, 23, 24, 25, 26, 27, 28   Family History  Problem Relation Age of Onset  . CAD Father   . Heart disease Father   . Heart failure Mother   . Breast cancer Sister   . CAD Brother    Social History  Substance Use Topics  . Smoking status: Never Smoker   . Smokeless tobacco: Never Used  . Alcohol Use: No     Comment: OCCASSIONAL    Review of Systems  Constitutional:  Negative for fever, chills and fatigue.  HENT: Negative for congestion, postnasal drip and rhinorrhea.   Eyes: Negative for visual disturbance.  Respiratory: Negative for cough, chest tightness and shortness of breath.   Cardiovascular: Negative for chest pain and palpitations.  Gastrointestinal: Negative for nausea, vomiting, abdominal pain and diarrhea.  Genitourinary: Negative for dysuria, urgency and hematuria.  Musculoskeletal: Positive for back pain. Negative for myalgias and neck stiffness.  Skin: Negative for rash and wound.  Neurological: Positive for seizures. Negative for dizziness, weakness, light-headedness, numbness and headaches.  Hematological: Does not bruise/bleed easily.      Allergies  Iodinated diagnostic agents; Latex; Statins; and Ceftin  Home Medications   Prior to  Admission medications   Medication Sig Start Date End Date Taking? Authorizing Provider  ARIPiprazole (ABILIFY) 5 MG tablet Take 5 mg by mouth daily.   Yes Historical Provider, MD  carisoprodol (SOMA) 350 MG tablet TAKE 1 TABLET BY MOUTH 3 TIMES A DAY 11/22/14  Yes Historical Provider, MD  diazepam (VALIUM) 5 MG tablet Take 5 mg by mouth every 6 (six) hours.  12/13/14  Yes Historical Provider, MD  diphenhydrAMINE (BENADRYL) 25 mg capsule Take 50 mg by mouth at bedtime as needed for sleep.   Yes Historical Provider, MD  docusate sodium (COLACE) 100 MG capsule Take 100 mg by mouth daily.    Yes Historical Provider, MD  DULoxetine (CYMBALTA) 60 MG capsule Take 2 capsules (120 mg total) by mouth every morning. 01/06/15  Yes York Spaniel, MD  ezetimibe (ZETIA) 10 MG tablet Take 10 mg by mouth daily.   Yes Historical Provider, MD  fentaNYL (DURAGESIC - DOSED MCG/HR) 25 MCG/HR patch Place 25 mcg onto the skin every 3 (three) days. Currently has patch on. Needs to be changed today.   Yes Historical Provider, MD  levETIRAcetam (KEPPRA) 1000 MG tablet Take 1.5 tablets in the AM and 1 tablet in the PM 09/11/15  Yes Butch Penny, NP  Menthol, Topical Analgesic, (BIOFREEZE) 4 % GEL Apply 1 application topically 2 (two) times daily as needed (pain).    Yes Historical Provider, MD  Multiple Vitamin (MULTIVITAMIN WITH MINERALS) TABS tablet Take 1 tablet by mouth daily.   Yes Historical Provider, MD  oxymorphone (OPANA) 10 MG tablet Take 10 mg by mouth every 6 (six) hours as needed for pain.   Yes Historical Provider, MD  propranolol (INDERAL) 20 MG tablet TAKE 1 TABLET BY MOUTH TWICE A DAY 07/15/15  Yes York Spaniel, MD  promethazine (PHENERGAN) 25 MG tablet Take 25 mg by mouth daily as needed. 09/19/15 09/26/15  Historical Provider, MD   BP 123/72 mmHg  Pulse 69  Temp(Src) 97.9 F (36.6 C) (Oral)  Resp 16  SpO2 95% Physical Exam  Constitutional: He is oriented to person, place, and time. He appears  well-developed and well-nourished. No distress.  HENT:  Head: Normocephalic and atraumatic.  Right Ear: External ear normal.  Left Ear: External ear normal.  Mouth/Throat: Oropharynx is clear and moist. No oropharyngeal exudate.  Eyes: EOM are normal. Pupils are equal, round, and reactive to light.  Neck: Normal range of motion. Neck supple.  Cardiovascular: Normal rate, regular rhythm, normal heart sounds and intact distal pulses.   No murmur heard. Pulmonary/Chest: Effort normal. No respiratory distress. He has no wheezes. He has no rales.    Abdominal: Soft. He exhibits no distension. There is no tenderness.  Musculoskeletal: He exhibits no edema.       Right hip: Normal.  Left hip: Normal.       Thoracic back: Normal.       Lumbar back: He exhibits decreased range of motion, tenderness and pain. He exhibits no swelling, no edema and no laceration.  Neurological: He is alert and oriented to person, place, and time. He has normal strength. No cranial nerve deficit or sensory deficit. He exhibits normal muscle tone.  Patient moving all extremities without difficulty. No saddle anesthesia. Normal sensation without any noted deficits or reported deficits.  Skin: Skin is warm and dry. No rash noted. He is not diaphoretic.  Psychiatric: His affect is blunt.  Vitals reviewed.   ED Course  Procedures (including critical care time) Labs Review Labs Reviewed  COMPREHENSIVE METABOLIC PANEL - Abnormal; Notable for the following:    Glucose, Bld 125 (*)    Alkaline Phosphatase 144 (*)    Anion gap 4 (*)    All other components within normal limits  CBC WITH DIFFERENTIAL/PLATELET  URINALYSIS, ROUTINE W REFLEX MICROSCOPIC (NOT AT [facility])    Imaging Review Ct Abdomen Pelvis Wo Contrast  10/08/2015  CLINICAL DATA:  Right lower back pain. Right flank pain. Bruising over anterior chest. EXAM: CT ABDOMEN AND PELVIS WITHOUT CONTRAST TECHNIQUE: Multidetector CT imaging of the abdomen and  pelvis was performed following the standard protocol without IV contrast. COMPARISON:  Lumbar spine radiographs 09/20/2015 FINDINGS: Lower chest: Linear atelectasis or scarring in the right lower lobe. Coronary artery calcifications. The heart appears enlarged. Soft tissue stranding of the right anterior chest, partially included. Nondisplaced fracture of anterior most ninth and tenth ribs. Liver: Homogeneous attenuation. No evidence of focal lesion or acute abnormality allowing for lack contrast. No perihepatic fluid. Hepatobiliary: Gallbladder physiologically distended, no calcified stone. No biliary dilatation. Pancreas: No ductal dilatation or inflammation. Spleen: Homogeneous attenuation. No focal abnormality allowing for lack contrast. Adrenal glands: Normal. Kidneys: Symmetric in size without stones or hydronephrosis. There is no perinephric stranding. Both ureters are decompressed without stones along the course. Stomach/Bowel: Stomach physiologically distended. There are no dilated or thickened small bowel loops. Small volume of stool throughout the colon without colonic wall thickening. The appendix is not confidently identified. Vascular/Lymphatic: No retroperitoneal adenopathy. Abdominal aorta is normal in caliber. Atherosclerosis without aneurysm. Reproductive: Prostate gland not well seen. Bladder: Physiologically distended, no stone or wall thickening. Other: No free air, free fluid, or intra-abdominal fluid collection. No soft tissue stranding of the chest wall. Musculoskeletal: Compression deformity of T12, chronic compared to lumbar spine radiographs November 2016. There are no acute or suspicious osseous abnormalities. Degenerative change in the lumbar spine most prominent at L4-L5. Electrode is seen terminating in the spinal canal at the level of T12. Left hip arthroplasty. Mild avascular necrosis of the right femoral head. IMPRESSION: 1. Soft tissue edema about the right lower anterior chest  wall. Nondisplaced fractures of anterior-most right ninth and tenth ribs. 2. No evidence of acute abnormality or traumatic injury to the abdomen or pelvis. 3. Chronic compression deformity of T12. No acute fracture or subluxation of the lumbar spine. Electronically Signed   By: Rubye Oaks M.D.   On: 10/08/2015 01:50   Dg Chest 2 View  10/08/2015  CLINICAL DATA:  Right flank and back pain. Bruising over anterior chest. Seizure this evening. Shortness of breath. EXAM: CHEST  2 VIEW COMPARISON:  Right rib radiographs 09/25/2015. FINDINGS: Lung volumes are low. In combination with body habitus this limits assessment. Cardiomediastinal contours are unchanged allowing for differences in technique. An azygos fissure is  seen. No pleural effusion or pneumothorax. Minimal subsegmental atelectasis at the left lung base. No focal opacity. Osseous structures are grossly intact. IMPRESSION: Hypoventilatory chest without evidence of acute process. Electronically Signed   By: Rubye Oaks M.D.   On: 10/08/2015 00:20   I have personally reviewed and evaluated these images and lab results as part of my medical decision-making.   EKG Interpretation   Date/Time:  Tuesday Oct 07 2015 23:34:36 EDT Ventricular Rate:  70 PR Interval:  174 QRS Duration: 82 QT Interval:  414 QTC Calculation: 447 R Axis:   21 Text Interpretation:  Normal sinus rhythm Inferior infarct , age  undetermined Abnormal ECG Confirmed by Donnald Garre, MD, Lebron Conners [phone]) on  10/07/2015 11:50:47 PM      MDM  Patient was seen and evaluated in stable condition. Unremarkable neurologic examination. There are no nodes reviewed. Patient given loading dose of Keppra. At this time he should continue his Keppra dosing per his neurologist and follow-up with them outpatient. Laboratory studies including CBC, CMP, UA were unremarkable. EKG without acute finding. CT abdomen and pelvis without acute finding but of note the anterior most right ninth and  10th ribs have nondisplaced fractures. This corresponds to the bruise on the patient's chest wall. There is a chronic compression deformity of T12. Likely that is the cause of his pain. The patient is already on narcotics outpatient and was started on a fentanyl patch today. Patient. Overly medicated in the emergency department and was not given further pain medication here. He was ejected to continue his home pain medications. He was provided with an incentive spirometer and educated on its use. He was discharged back to his nursing facility in stable condition. Final diagnoses:  Seizure (HCC)  Chronic back pain    1. Breakthrough seizure 2. Chronic back pain 3. Rib fracture    Leta Baptist, MD 10/08/15 [phone]

## 2015-10-08 NOTE — ED Notes (Signed)
Pt transported to CT ?

## 2015-10-08 NOTE — Discharge Instructions (Signed)
You were seen and evaluated today for your seizure and increased back pain. Please continue to take the Keppra as prescribed by her neurologist. Follow-up with your neurologist for further recommendations. You have been given an x-ray dose of Keppra here this evening. Imaging shows that your back pain is still related to an old compression fracture. There were no acute findings. Continue to use the pain medicine you are prescribed outpatient. Imaging did also show that you have a rib fracture in the area where he had your bruising. Please use the incentive spirometer provided to help keep your lungs open - try to do this at least do this 3-4 times a day.  Please follow up with her primary care physician.  Rib Fracture A rib fracture is a break or crack in one of the bones of the ribs. The ribs are a group of long, curved bones that wrap around your chest and attach to your spine. They protect your lungs and other organs in the chest cavity. A broken or cracked rib is often painful, but most do not cause other problems. Most rib fractures heal on their own over time. However, rib fractures can be more serious if multiple ribs are broken or if broken ribs move out of place and push against other structures. CAUSES   A direct blow to the chest. For example, this could happen during contact sports, a car accident, or a fall against a hard object.  Repetitive movements with high force, such as pitching a baseball or having severe coughing spells. SYMPTOMS   Pain when you breathe in or cough.  Pain when someone presses on the injured area. DIAGNOSIS  Your caregiver will perform a physical exam. Various imaging tests may be ordered to confirm the diagnosis and to look for related injuries. These tests may include a chest X-ray, computed tomography (CT), magnetic resonance imaging (MRI), or a bone scan. TREATMENT  Rib fractures usually heal on their own in 1-3 months. The longer healing period is often  associated with a continued cough or other aggravating activities. During the healing period, pain control is very important. Medication is usually given to control pain. Hospitalization or surgery may be needed for more severe injuries, such as those in which multiple ribs are broken or the ribs have moved out of place.  HOME CARE INSTRUCTIONS   Avoid strenuous activity and any activities or movements that cause pain. Be careful during activities and avoid bumping the injured rib.  Gradually increase activity as directed by your caregiver.  Only take over-the-counter or prescription medications as directed by your caregiver. Do not take other medications without asking your caregiver first.  Apply ice to the injured area for the first 1-2 days after you have been treated or as directed by your caregiver. Applying ice helps to reduce inflammation and pain.  Put ice in a plastic bag.  Place a towel between your skin and the bag.   Leave the ice on for 15-20 minutes at a time, every 2 hours while you are awake.  Perform deep breathing as directed by your caregiver. This will help prevent pneumonia, which is a common complication of a broken rib. Your caregiver may instruct you to:  Take deep breaths several times a day.  Try to cough several times a day, holding a pillow against the injured area.  Use a device called an incentive spirometer to practice deep breathing several times a day.  Drink enough fluids to keep your urine clear  or pale yellow. This will help you avoid constipation.   Do not wear a rib belt or binder. These restrict breathing, which can lead to pneumonia.  SEEK IMMEDIATE MEDICAL CARE IF:   You have a fever.   You have difficulty breathing or shortness of breath.   You develop a continual cough, or you cough up thick or bloody sputum.  You feel sick to your stomach (nausea), throw up (vomit), or have abdominal pain.   You have worsening pain not  controlled with medications.  MAKE SURE YOU:  Understand these instructions.  Will watch your condition.  Will get help right away if you are not doing well or get worse.   This information is not intended to replace advice given to you by your health care provider. Make sure you discuss any questions you have with your health care provider.   Document Released: 04/26/2005 Document Revised: 12/27/2012 Document Reviewed: 06/28/2012 Elsevier Interactive Patient Education 2016 Else[employer] Inc[employer]  Vertebral Fracture A vertebral fracture is a break in one of the bones that make up the spine (vertebrae). The vertebrae are stacked on top of each other to form the spinal column. They support the body and protect the spinal cord. The vertebral column has an upper part (cervical spine), a middle part (thoracic spine), and a lower part (lumbar spine). Most vertebral fractures occur in the thoracic spine or lumbar spine. There are three main types of vertebral fractures:  Flexion fracture. This happens when vertebrae collapse. Vertebrae can collapse:  In the front (compression fracture). This type of fracture is common in people who have a condition that causes their bones to be weak and brittle (osteoporosis). The fracture can make a person lose height.  In the front and back (axial burst fracture).  Extension fracture. This happens when an external force pulls apart the vertebrae.  Rotation fracture. This happens when the spine bends extremely in one direction. This type can cause a piece of a vertebra to break off (transverse process fracture) or move out of its normal position (fracture dislocation). This type of fracture has a high risk for spinal cord injury. Vertebral fractures can range from mild to very severe. The most severe types are those that cause the broken bones to move out of place (unstable) and those that injure or press on the spinal cord. CAUSES This condition is usually caused by  a forceful injury. This type of injury commonly results from:  Car accidents.  Falling or jumping from a great height.  Collisions in contact sports.  Violent acts, such as an assault or a gunshot wound. RISK FACTORS This injury is more likely to happen to people who:  Have osteoporosis.  Participate in contact sports.  Are in situations that could result in falls or other violent injuries. SYMPTOMS Symptoms of this injury depend on the location and the type of fracture. The most common symptom is back pain that gets worse with movement. You may also have trouble standing or walking. If a fracture has damaged your spinal cord or is pressing on it, you may also have:  Numbness.  Tingling.  Weakness.  Loss of movement.  Loss of bowel or bladder control. DIAGNOSIS This injury may be diagnosed based on symptoms, medical history, and a physical exam. You may also have imaging tests to confirm the diagnosis. These may include:  Spine X-ray.  CT scan.  MRI. TREATMENT Treatment for this injury depends on the type of fracture. If your fracture is  stable and does not affect your spinal cord, it may heal with nonsurgical treatment, such as:  Taking pain medicine.  Wearing a cast or a brace.  Doing physical therapy exercises. If your vertebral fracture is unstable or it affects your spinal cord, you may need surgical treatment, such as:  Laminectomy. This procedure involves removing the part of a vertebra that is pushing on the spinal cord (spinal decompression surgery). Bone fragments may also be removed.  Spinal fusion. This procedure is used to stabilize an unstable fracture. Vertebrae may be joined together with a piece of bone from another part of your body (graft) and held in place with rods, plates, or screws.  Vertebroplasty. In this procedure, bone cement is used to rebuild collapsed vertebrae. HOME CARE INSTRUCTIONS General Instructions  Take medicines only as  directed by your health care provider.  Do not drive or operate heavy machinery while taking pain medicine.  If directed, apply ice to the injured area:  Put ice in a plastic bag.  Place a towel between your skin and the bag.  Leave the ice on for 30 minutes every two hours at first. Then apply the ice as needed.  Wear your neck brace or back brace as directed by your health care provider.  Do not drink alcohol. Alcohol can interfere with your treatment.  Keep all follow-up visits as directed by your health care provider. This is important. It can help to prevent permanent injury, disability, and long-lasting (chronic) pain. Activity  Stay in bed (on bed rest) only as directed by your health care provider. Being on bed rest for too long can make your condition worse.  Return to your normal activities as directed by your health care provider. Ask what activities are safe for you.  Do exercises to improve motion and strength in your back (physical therapy), as recommended by your health care provider.   Exercise regularly as directed by your health care provider. SEEK MEDICAL CARE IF:  You have a fever.  You develop a cough that makes your pain worse.  Your pain medicine is not helping.  Your pain does not get better over time.  You cannot return to your normal activities as planned or expected. SEEK IMMEDIATE MEDICAL CARE IF:  Your pain is very bad and it suddenly gets worse.  You are unable to move any body part (paralysis) that is below the level of your injury.  You have numbness, tingling, or weakness in any body part that is below the level of your injury.  You cannot control your bladder or bowels.   This information is not intended to replace advice given to you by your health care provider. Make sure you discuss any questions you have with your health care provider.   Document Released: 06/03/2004 Document Revised: 09/10/2014 Document Reviewed:  05/01/2014 Elsevier Interactive Patient Education [employer]  Seizure, Adult A seizure is abnormal electrical activity in the brain. Seizures usually last from 30 seconds to 2 minutes. There are various types of seizures. Before a seizure, you may have a warning sensation (aura) that a seizure is about to occur. An aura may include the following symptoms:   Fear or anxiety.  Nausea.  Feeling like the room is spinning (vertigo).  Vision changes, such as seeing flashing lights or spots. Common symptoms during a seizure include:  A change in attention or behavior (altered mental status).  Convulsions with rhythmic jerking movements.  Drooling.  Rapid eye movements.  Grunting.  Loss of bladder and bowel control.  Bitter taste in the mouth.  Tongue biting. After a seizure, you may feel confused and sleepy. You may also have an injury resulting from convulsions during the seizure. HOME CARE INSTRUCTIONS   If you are given medicines, take them exactly as prescribed by your health care provider.  Keep all follow-up appointments as directed by your health care provider.  Do not swim or drive or engage in risky activity during which a seizure could cause further injury to you or others until your health care provider says it is OK.  Get adequate rest.  Teach friends and family what to do if you have a seizure. They should:  Lay you on the ground to prevent a fall.  Put a cushion under your head.  Loosen any tight clothing around your neck.  Turn you on your side. If vomiting occurs, this helps keep your airway clear.  Stay with you until you recover.  Know whether or not you need emergency care. SEEK IMMEDIATE MEDICAL CARE IF:  The seizure lasts longer than 5 minutes.  The seizure is severe or you do not wake up immediately after the seizure.  You have an altered mental status after the seizure.  You are having more frequent or worsening  seizures. Someone should drive you to the emergency department or call local emergency services (911 in [place] MAKE SURE YOU:  Understand these instructions.  Will watch your condition.  Will get help right away if you are not doing well or get worse.   This information is not intended to replace advice given to you by your health care provider. Make sure you discuss any questions you have with your health care provider.   Document Released: 04/23/2000 Document Revised: 05/17/2014 Document Reviewed: 12/06/2012 Elsevier Interactive Patient Education [employer] Inc[employer]

## 2015-10-08 NOTE — ED Notes (Signed)
[facility] called  

## 2015-10-08 NOTE — ED Notes (Addendum)
[facility] arrived.  

## 2015-10-08 NOTE — ED Notes (Signed)
Called facilities for an incentive spirometer.

## 2015-12-03 ENCOUNTER — Emergency Department (HOSPITAL_COMMUNITY): Payer: Medicare Other

## 2015-12-03 ENCOUNTER — Emergency Department (HOSPITAL_COMMUNITY)
Admission: EM | Admit: 2015-12-03 | Discharge: 2015-12-03 | Disposition: A | Discharge status: Home / Self Care | Payer: Medicare Other | Attending: Emergency Medicine | Admitting: Emergency Medicine

## 2015-12-03 ENCOUNTER — Encounter (HOSPITAL_COMMUNITY): Payer: Self-pay | Admitting: Emergency Medicine

## 2015-12-03 DIAGNOSIS — E039 Hypothyroidism, unspecified: Secondary | ICD-10-CM | POA: Insufficient documentation

## 2015-12-03 DIAGNOSIS — W07XXXA Fall from chair, initial encounter: Secondary | ICD-10-CM | POA: Insufficient documentation

## 2015-12-03 DIAGNOSIS — Z6841 Body Mass Index (BMI) 40.0 and over, adult: Secondary | ICD-10-CM | POA: Diagnosis not present

## 2015-12-03 DIAGNOSIS — Z79899 Other long term (current) drug therapy: Secondary | ICD-10-CM | POA: Diagnosis not present

## 2015-12-03 DIAGNOSIS — Y999 Unspecified external cause status: Secondary | ICD-10-CM | POA: Diagnosis not present

## 2015-12-03 DIAGNOSIS — Y92019 Unspecified place in single-family (private) house as the place of occurrence of the external cause: Secondary | ICD-10-CM | POA: Diagnosis not present

## 2015-12-03 DIAGNOSIS — Y939 Activity, unspecified: Secondary | ICD-10-CM | POA: Diagnosis not present

## 2015-12-03 DIAGNOSIS — R0781 Pleurodynia: Secondary | ICD-10-CM | POA: Diagnosis present

## 2015-12-03 DIAGNOSIS — M1990 Unspecified osteoarthritis, unspecified site: Secondary | ICD-10-CM | POA: Diagnosis not present

## 2015-12-03 DIAGNOSIS — F329 Major depressive disorder, single episode, unspecified: Secondary | ICD-10-CM | POA: Diagnosis not present

## 2015-12-03 DIAGNOSIS — I509 Heart failure, unspecified: Secondary | ICD-10-CM | POA: Insufficient documentation

## 2015-12-03 DIAGNOSIS — E669 Obesity, unspecified: Secondary | ICD-10-CM | POA: Diagnosis not present

## 2015-12-03 DIAGNOSIS — E785 Hyperlipidemia, unspecified: Secondary | ICD-10-CM | POA: Diagnosis not present

## 2015-12-03 NOTE — Progress Notes (Signed)
Rt gave pt and IS. Pt knows and understands how to use. Family at bedside.

## 2015-12-03 NOTE — Discharge Instructions (Signed)
Please read and follow all provided instructions.  Your diagnoses today include:  1. Rib pain on right side     Tests performed today include: Vital signs. See below for your results today.   Medications prescribed:  Take as prescribed   Home care instructions:  Follow any educational materials contained in this packet.  Follow-up instructions: Please follow-up with your primary care provider for further evaluation of symptoms and treatment   Return instructions:  Please return to the Emergency Department if you do not get better, if you get worse, or new symptoms OR  - Fever (temperature greater than 101.8F)  - Bleeding that does not stop with holding pressure to the area    -Severe pain (please note that you may be more sore the day after your accident)  - Chest Pain  - Difficulty breathing  - Severe nausea or vomiting  - Inability to tolerate food and liquids  - Passing out  - Skin becoming red around your wounds  - Change in mental status (confusion or lethargy)  - New numbness or weakness    Please return if you have any other emergent concerns.  Additional Information:  Your vital signs today were: BP 129/89 (BP Location: Left Arm)    Pulse 74    Temp 98.8 F (37.1 C) (Oral)    Resp 19    Ht 5\' 10"  (1.778 m)    Wt 127 kg    SpO2 94%    BMI 40.18 kg/m  If your blood pressure (BP) was elevated above 135/85 this visit, please have this repeated by your doctor within one month. ---------------

## 2015-12-03 NOTE — ED Provider Notes (Signed)
[facility]-EMERGENCY DEPT Provider Note   CSN: [id number] Arrival date & time: 12/03/15  1516  First Provider Contact:  First MD Initiated Contact with Patient 12/03/15 1642     History   Chief Complaint Chief Complaint  Patient presents with  . Fall  . rib cage pain    HPI Alex Mcintosh is a 65 y.o. male.  HPI  65 y.o. male, presents to the Emergency Department today from [facility] house for fall out of chair today. Pt complains of right rib cage pain. States that he sat down on a chair outside and noticed that the chair was uneven due to being placed on uneven rocks. The chair flipped the right and he landed on his right rib cage. No head trauma. No LOC. States pain is 10/10 and isolated on right rib. OTC without relief. Pt currently takes morphine, fentanyl, and oxycodone for pain from pain management clinic. No fevers. No SOB. No N/V. No headaches. No numbness/tingling. No other symptoms noted.    Past Medical History:  Diagnosis Date  . Abnormality of gait 08/03/2013  . Acute encephalopathy 09/06/12   was admitted to [facility]  . Anxiety   . Arthritis   . 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 Cervicogenic headache 10/26/2013  . CHF (congestive heart failure) (HCC)    diurects stopped in April  . Chronic back pain   . Chronic narcotic dependence (HCC)   . Depression   . DVT (deep venous thrombosis) (HCC)    01/2012  . Foot drop, bilateral   . GERD (gastroesophageal reflux disease)   . History of cerebrovascular disease   . Hyperlipemia   . Hypothyroidism   . Memory loss of unknown cause   . Myoclonus   . Neuropathy (HCC)    legs  . Obese   . Seizures (HCC)    Myoclonus  . Shortness of breath    with exertion  . UTI (lower urinary tract infection) 5/14   was confused and was found in [place].    Patient Active Problem List   Diagnosis Date Noted  . Cervicogenic headache 10/26/2013  . Acute respiratory failure (HCC) 08/31/2013  . Pneumonia, organism unspecified  08/30/2013  . Depression 08/30/2013  . GERD (gastroesophageal reflux disease) 08/30/2013  . Obesity 08/30/2013  . Abnormality of gait 08/03/2013  . Hypokalemia 02/12/2013  . Bacteremia due to Gram-positive bacteria 02/12/2013  . Diarrhea 02/12/2013  . Convulsions/seizures (HCC) 02/12/2013  . Chest discomfort 01/07/2013  . Acute encephalopathy 09/06/2012  . Hyperlipidemia 09/06/2012  . History of DVT of lower extremity 09/06/2012  . Chronic pain 09/06/2012  . UTI (lower urinary tract infection) 09/06/2012    Past Surgical History:  Procedure Laterality Date  . BACK SURGERY     4 lumbar  . CERVICAL FUSION    . HIP SURGERY Left   . JOINT REPLACEMENT    . KNEE ARTHROSCOPY Right   . PAIN PUMP IMPLANTATION  Reports no longer has pump  . TOOTH EXTRACTION N/A 11/16/2012   Procedure: DENTAL EXTRACTIONS OF MULTIPLE NONRESTORABLE TEETH;  Surgeon: Hinton Dyer, DDS;  Location: [facility] OR;  Service: Oral Surgery;  Laterality: N/A;  #12, 14, 15, 22, 23, 24, 25, 26, 27, 28    Home Medications    Prior to Admission medications   Medication Sig Start Date End Date Taking? Authorizing Provider  ARIPiprazole (ABILIFY) 5 MG tablet Take 5 mg by mouth daily.   Yes Historical Provider, MD  carisoprodol (SOMA) 350 MG tablet TAKE 350 MG  BY MOUTH 3 TIMES A DAY 11/22/14  Yes Historical Provider, MD  diazepam (VALIUM) 5 MG tablet Take 5 mg by mouth 4 (four) times daily.  12/13/14  Yes Historical Provider, MD  diphenhydrAMINE (BENADRYL) 25 mg capsule Take 50 mg by mouth at bedtime as needed for sleep.   Yes Historical Provider, MD  DULoxetine (CYMBALTA) 60 MG capsule Take 2 capsules (120 mg total) by mouth every morning. 01/06/15  Yes York Spaniel, MD  ezetimibe (ZETIA) 10 MG tablet Take 10 mg by mouth daily.   Yes Historical Provider, MD  fentaNYL (DURAGESIC - DOSED MCG/HR) 25 MCG/HR patch Place 25 mcg onto the skin every 3 (three) days. Currently has patch on. Needs to be changed today.   Yes Historical  Provider, MD  levETIRAcetam (KEPPRA) 1000 MG tablet Take 1.5 tablets in the AM and 1 tablet in the PM Patient taking differently: Take 1,500 mg by mouth at bedtime.  09/11/15  Yes Butch Penny, NP  Menthol, Topical Analgesic, (BIOFREEZE) 4 % GEL Apply 1 application topically 2 (two) times daily as needed (pain).    Yes Historical Provider, MD  oxymorphone (OPANA) 10 MG tablet Take 10 mg by mouth every 6 (six) hours.    Yes Historical Provider, MD  propranolol (INDERAL) 20 MG tablet TAKE 1 TABLET BY MOUTH TWICE A DAY Patient taking differently: TAKE 20 MG BY MOUTH TWICE DAILY 07/15/15  Yes York Spaniel, MD  promethazine (PHENERGAN) 25 MG tablet Take 25 mg by mouth daily as needed. 09/19/15 09/26/15  Historical Provider, MD    Family History Family History  Problem Relation Age of Onset  . CAD Father   . Heart disease Father   . Heart failure Mother   . Breast cancer Sister   . CAD Brother     Social History Social History  Substance Use Topics  . Smoking status: Never Smoker  . Smokeless tobacco: Never Used  . Alcohol use No     Comment: OCCASSIONAL     Allergies   Iodinated diagnostic agents; Latex; Statins; and Ceftin [cefuroxime axetil]   Review of Systems Review of Systems ROS reviewed and all are negative for acute change except as noted in the HPI.  Physical Exam Updated Vital Signs BP 129/89 (BP Location: Left Arm)   Pulse 74   Temp 98.8 F (37.1 C) (Oral)   Resp 19   Ht 5\' 10"  (1.778 m)   Wt 127 kg   SpO2 94%   BMI 40.18 kg/m   Physical Exam  Constitutional: He is oriented to person, place, and time. Vital signs are normal. He appears well-developed and well-nourished.  HENT:  Head: Normocephalic.  Right Ear: Hearing normal.  Left Ear: Hearing normal.  Eyes: Conjunctivae and EOM are normal. Pupils are equal, round, and reactive to light.  Neck: Normal range of motion. Neck supple.  Cardiovascular: Normal rate, regular rhythm, normal heart sounds and  intact distal pulses.   Pulmonary/Chest: Effort normal and breath sounds normal. No accessory muscle usage. No tachypnea and no bradypnea. No respiratory distress. He has no decreased breath sounds. He has no wheezes. He has no rhonchi. He has no rales. He exhibits tenderness.  Right chest TTP along 6th and 7th ribs. No visible ecchymosis or erythema. No palpable deformities.   Neurological: He is alert and oriented to person, place, and time.  Skin: Skin is warm and dry.  Psychiatric: He has a normal mood  and affect. His speech is normal and behavior is normal. Thought content normal.   ED Treatments / Results  Labs (all labs ordered are listed, but only abnormal results are displayed) Labs Reviewed - No data to display  EKG  EKG Interpretation None      Radiology Dg Ribs Unilateral W/chest Right  Result Date: 12/03/2015 CLINICAL DATA:  Right rib pain secondary to a fall from a chair today. EXAM: RIGHT RIBS AND CHEST - 3+ VIEW COMPARISON:  Chest x-ray dated 10/08/2015 and rib radiographs dated 09/25/2015 FINDINGS: There are healing fractures of the anterior lateral aspects of the right sixth and seventh ribs. There is callus formation but the fracture line at the seventh rib is not completely healed. No pneumothorax. Bones are otherwise normal. Heart size and pulmonary vascularity are normal. IMPRESSION: Healing fractures of the anterior lateral aspects of the right sixth and seventh rib. Electronically Signed   By: Francene Boyers M.D.   On: 12/03/2015 16:04   Procedures Procedures (including critical care time)  Medications Ordered in ED Medications - No data to display   Initial Impression / Assessment and Plan / ED Course  I have reviewed the triage vital signs and the nursing notes.  Pertinent labs & imaging results that were available during my care of the patient were reviewed by me and considered in my medical decision making (see chart for details).  Clinical Course     Final Clinical Impressions(s) / ED Diagnoses  I have reviewed and evaluated the relevant imaging studies.  I have reviewed the relevant previous healthcare records. I obtained HPI from historian.  ED Course:  Assessment: Pt is a 64yM who presents with right rib pain s/p fall today. Notes sitting on chair on and tipping to the right. Landed on right rib. No head trauma. No LOC. On exam, pt in NAD. Nontoxic/nonseptic appearing. VSS. Afebrile. Lungs CTA. Heart RRR. Abdomen nontender soft. TTP along right ribs 6 and 7. No deformities palpable. CXR shows healing fractures of anterior lateral ribs 6 and 7. Pt has adequate pain regiment at facility. Will DC home with spirometer. Likely contusion of ribs. Plan is to DC home with follow up to PCP. At time of discharge, Patient is in no acute distress. Vital Signs are stable. Patient is able to ambulate. Patient able to tolerate PO.    Disposition/Plan:  DC Home Additional Verbal discharge instructions given and discussed with patient.  Pt Instructed to f/u with PCP in the next week for evaluation and treatment of symptoms. Return precautions given Pt acknowledges and agrees with plan  Supervising Physician Mancel Bale, MD   Final diagnoses:  Rib pain on right side    New Prescriptions New Prescriptions   No medications on file     Audry Pili, PA-C 12/03/15 1709    Mancel Bale, MD 12/09/15 [phone]

## 2015-12-03 NOTE — ED Triage Notes (Signed)
Patient from [facility] for fall out of chair that fell over. Patient c/o right rib cage pain.  Denies LOC or blood thinners.

## 2016-01-09 ENCOUNTER — Emergency Department (HOSPITAL_COMMUNITY)
Admission: EM | Admit: 2016-01-09 | Discharge: 2016-01-09 | Disposition: A | Discharge status: Home / Self Care | Payer: Medicare Other | Attending: Emergency Medicine | Admitting: Emergency Medicine

## 2016-01-09 ENCOUNTER — Encounter (HOSPITAL_COMMUNITY): Payer: Self-pay | Admitting: Emergency Medicine

## 2016-01-09 DIAGNOSIS — W19XXXA Unspecified fall, initial encounter: Secondary | ICD-10-CM | POA: Insufficient documentation

## 2016-01-09 DIAGNOSIS — M545 Low back pain: Secondary | ICD-10-CM | POA: Diagnosis not present

## 2016-01-09 DIAGNOSIS — Y9301 Activity, walking, marching and hiking: Secondary | ICD-10-CM | POA: Diagnosis not present

## 2016-01-09 DIAGNOSIS — E039 Hypothyroidism, unspecified: Secondary | ICD-10-CM | POA: Diagnosis not present

## 2016-01-09 DIAGNOSIS — Y999 Unspecified external cause status: Secondary | ICD-10-CM | POA: Insufficient documentation

## 2016-01-09 DIAGNOSIS — Z91040 Latex allergy status: Secondary | ICD-10-CM | POA: Diagnosis not present

## 2016-01-09 DIAGNOSIS — M549 Dorsalgia, unspecified: Secondary | ICD-10-CM | POA: Diagnosis present

## 2016-01-09 DIAGNOSIS — Y92129 Unspecified place in nursing home as the place of occurrence of the external cause: Secondary | ICD-10-CM | POA: Diagnosis not present

## 2016-01-09 DIAGNOSIS — I509 Heart failure, unspecified: Secondary | ICD-10-CM | POA: Diagnosis not present

## 2016-01-09 LAB — BASIC METABOLIC PANEL
Anion gap: 8 (ref 5–15)
BUN: 8 mg/dL (ref 6–20)
CHLORIDE: 103 mmol/L (ref 101–111)
CO2: 29 mmol/L (ref 22–32)
CREATININE: 0.86 mg/dL (ref 0.61–1.24)
Calcium: 9.6 mg/dL (ref 8.9–10.3)
Glucose, Bld: 107 mg/dL — ABNORMAL HIGH (ref 65–99)
POTASSIUM: 4.7 mmol/L (ref 3.5–5.1)
SODIUM: 140 mmol/L (ref 135–145)

## 2016-01-09 LAB — CBC
HEMATOCRIT: 45.9 % (ref 39.0–52.0)
Hemoglobin: 15.1 g/dL (ref 13.0–17.0)
MCH: 29.7 pg (ref 26.0–34.0)
MCHC: 32.9 g/dL (ref 30.0–36.0)
MCV: 90.2 fL (ref 78.0–100.0)
PLATELETS: 173 10*3/uL (ref 150–400)
RBC: 5.09 MIL/uL (ref 4.22–5.81)
RDW: 13.1 % (ref 11.5–15.5)
WBC: 8.2 10*3/uL (ref 4.0–10.5)

## 2016-01-09 LAB — CBG MONITORING, ED: GLUCOSE-CAPILLARY: 94 mg/dL (ref 65–99)

## 2016-01-09 MED ORDER — MORPHINE SULFATE 15 MG PO TABS
15.0000 mg | ORAL_TABLET | Freq: Once | ORAL | Status: AC
  Administered 2016-01-09: 15 mg via ORAL
  Filled 2016-01-09: qty 1

## 2016-01-09 NOTE — Discharge Instructions (Signed)
All of our cardiac workup is normal, including labs, EKG and chest X-RAY are normal.  Cardiac monitoring was also normal.

## 2016-01-09 NOTE — ED Notes (Signed)
Awaiting PTAR. 

## 2016-01-09 NOTE — ED Notes (Signed)
[facility] , patient is stable and able to be transferred back at this time.  Discharge instructions sent with PTAR back to facility. Patient verbalized understanding of the discharge instructions and [facility]

## 2016-01-09 NOTE — ED Provider Notes (Signed)
[facility]-EMERGENCY DEPT Provider Note   CSN: [id number] Arrival date & time: 01/09/16  1252     History   Chief Complaint Chief Complaint  Patient presents with  . Loss of Consciousness    HPI Alex Mcintosh is a 65 y.o. male.  HPI PT comes in with cc of fall. Pt has hx of CHF, chronic pain and poor gait. Pt reports that he was walking back from bathroom. The next thing he recalls is that he was on the floor/bookshelf. Pt is having R sided rib pain and worsening of his chronic back pain on the L side due to the fall. No current headache. No nausea, vomiting, visual complains, seizures, altered mental status, loss of consciousness, new weakness, or numbness. Pt also reports that the facility has stopped his meds abruptly and he thinks that's why he fell.  Past Medical History:  Diagnosis Date  . Abnormality of gait 08/03/2013  . Acute encephalopathy 09/06/12   was admitted to [facility]  . Anxiety   . Arthritis   . 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 Cervicogenic headache 10/26/2013  . CHF (congestive heart failure) (HCC)    diurects stopped in April  . Chronic back pain   . Chronic narcotic dependence (HCC)   . Depression   . DVT (deep venous thrombosis) (HCC)    01/2012  . Foot drop, bilateral   . GERD (gastroesophageal reflux disease)   . History of cerebrovascular disease   . Hyperlipemia   . Hypothyroidism   . Memory loss of unknown cause   . Myoclonus   . Neuropathy (HCC)    legs  . Obese   . Seizures (HCC)    Myoclonus  . Shortness of breath    with exertion  . UTI (lower urinary tract infection) 5/14   was confused and was found in [place].    Patient Active Problem List   Diagnosis Date Noted  . Cervicogenic headache 10/26/2013  . Acute respiratory failure (HCC) 08/31/2013  . Pneumonia, organism unspecified 08/30/2013  . Depression 08/30/2013  . GERD (gastroesophageal reflux disease) 08/30/2013  . Obesity 08/30/2013  . Abnormality of gait 08/03/2013    . Hypokalemia 02/12/2013  . Bacteremia due to Gram-positive bacteria 02/12/2013  . Diarrhea 02/12/2013  . Convulsions/seizures (HCC) 02/12/2013  . Chest discomfort 01/07/2013  . Acute encephalopathy 09/06/2012  . Hyperlipidemia 09/06/2012  . History of DVT of lower extremity 09/06/2012  . Chronic pain 09/06/2012  . UTI (lower urinary tract infection) 09/06/2012    Past Surgical History:  Procedure Laterality Date  . BACK SURGERY     4 lumbar  . CERVICAL FUSION    . HIP SURGERY Left   . JOINT REPLACEMENT    . KNEE ARTHROSCOPY Right   . PAIN PUMP IMPLANTATION  Reports no longer has pump  . TOOTH EXTRACTION N/A 11/16/2012   Procedure: DENTAL EXTRACTIONS OF MULTIPLE NONRESTORABLE TEETH;  Surgeon: Hinton Dyer, DDS;  Location: [facility] OR;  Service: Oral Surgery;  Laterality: N/A;  #12, 14, 15, 22, 23, 24, 25, 26, 27, 28       Home Medications    Prior to Admission medications   Medication Sig Start Date End Date Taking? Authorizing Provider  ARIPiprazole (ABILIFY) 5 MG tablet Take 5 mg by mouth daily.   Yes Historical Provider, MD  carisoprodol (SOMA) 350 MG tablet TAKE 350 MG  BY MOUTH 3 TIMES A DAY 11/22/14  Yes Historical Provider, MD  diazepam (  VALIUM) 5 MG tablet Take 5 mg by mouth 4 (four) times daily.  12/13/14  Yes Historical Provider, MD  diphenhydrAMINE (BENADRYL) 25 mg capsule Take 50 mg by mouth at bedtime as needed for sleep.   Yes Historical Provider, MD  DULoxetine (CYMBALTA) 60 MG capsule Take 2 capsules (120 mg total) by mouth every morning. 01/06/15  Yes York Spanielharles K Willis, MD  ezetimibe (ZETIA) 10 MG tablet Take 10 mg by mouth daily.   Yes Historical Provider, MD  fentaNYL (DURAGESIC - DOSED MCG/HR) 25 MCG/HR patch Place 25 mcg onto the skin every 3 (three) days. Currently has patch on. Needs to be changed today.   Yes Historical Provider, MD  levETIRAcetam (KEPPRA) 1000 MG tablet Take 1.5 tablets in the AM and 1 tablet in the PM Patient taking differently: Take 1,500  mg by mouth at bedtime.  09/11/15  Yes Butch PennyMegan Millikan, NP  Menthol, Topical Analgesic, (BIOFREEZE) 4 % GEL Apply 1 application topically 2 (two) times daily as needed (pain).    Yes Historical Provider, MD  promethazine (PHENERGAN) 25 MG tablet Take 25 mg by mouth every 8 (eight) hours as needed for nausea or vomiting.   Yes Historical Provider, MD  propranolol (INDERAL) 20 MG tablet TAKE 1 TABLET BY MOUTH TWICE A DAY Patient taking differently: TAKE 20 MG BY MOUTH TWICE DAILY 07/15/15  Yes York Spanielharles K Willis, MD  oxymorphone (OPANA) 10 MG tablet Take 10 mg by mouth every 6 (six) hours.     Historical Provider, MD  promethazine (PHENERGAN) 25 MG tablet Take 25 mg by mouth daily as needed. 09/19/15 09/26/15  Historical Provider, MD    Family History Family History  Problem Relation Age of Onset  . CAD Father   . Heart disease Father   . Heart failure Mother   . Breast cancer Sister   . CAD Brother     Social History Social History  Substance Use Topics  . Smoking status: Never Smoker  . Smokeless tobacco: Never Used  . Alcohol use No     Comment: OCCASSIONAL     Allergies   Iodinated diagnostic agents; Latex; Statins; and Ceftin [cefuroxime axetil]   Review of Systems Review of Systems  ROS 10 Systems reviewed and are negative for acute change except as noted in the HPI.     Physical Exam Updated Vital Signs BP 130/94 (BP Location: Left Arm)   Pulse 71   Temp 97.7 F (36.5 C) (Oral)   Resp 12   Ht 5\' 10"  (1.778 m)   Wt 280 lb (127 kg)   SpO2 92%   BMI 40.18 kg/m   Physical Exam  Constitutional: He is oriented to person, place, and time. He appears well-developed.  HENT:  Head: Atraumatic.  Neck: Neck supple.  No midline c-spine tenderness, pt able to turn head to 45 degrees bilaterally without any pain and able to flex neck to the chest and extend without any pain or neurologic symptoms.   Cardiovascular: Normal rate.   Pulmonary/Chest: Effort normal and breath  sounds normal. He has no wheezes. He has no rales.  Abdominal: He exhibits no distension. There is no tenderness.  Musculoskeletal:  Head to toe evaluation shows no hematoma, bleeding of the scalp, no facial abrasions, step offs, crepitus, no tenderness to palpation of the bilateral upper and lower extremities, no gross deformities, no chest tenderness, no pelvic pain.  Pt has tenderness over the lumbar region and on the L paraspinal region. No step offs, no  erythema. Pt has 2+ patellar reflex bilaterally. Able to discriminate between sharp and dull. Able to ambulate   Neurological: He is alert and oriented to person, place, and time.  Skin: Skin is warm.  Nursing note and vitals reviewed.    ED Treatments / Results  Labs (all labs ordered are listed, but only abnormal results are displayed) Labs Reviewed  BASIC METABOLIC PANEL - Abnormal; Notable for the following:       Result Value   Glucose, Bld 107 (*)    All other components within normal limits  CBC  CBG MONITORING, ED    EKG  EKG Interpretation  Date/Time:  Friday January 09 2016 13:01:46 EDT Ventricular Rate:  79 PR Interval:    QRS Duration: 93 QT Interval:  399 QTC Calculation: 458 R Axis:   21 Text Interpretation:  Sinus rhythm [id number] No acute changes No significant change since last tracing Confirmed by Rhunette Croft, MD, Janey Genta [phone]) on 01/09/2016 2:11:55 PM       Radiology No results found.  Procedures Procedures (including critical care time)  Medications Ordered in ED Medications  morphine (MSIR) tablet 15 mg (15 mg Oral Given 01/09/16 1622)     Initial Impression / Assessment and Plan / ED Course  I have reviewed the triage vital signs and the nursing notes.  Pertinent labs & imaging results that were available during my care of the patient were reviewed by me and considered in my medical decision making (see chart for details).  Clinical Course    DDx includes: Orthostatic  hypotension Stroke Vertebral artery dissection/stenosis Dysrhythmia PE Vasovagal/neurocardiogenic syncope Aortic stenosis Valvular disorder/Cardiomyopathy Anemia Medication related  PT comes in with cc of fall. Possible syncope. I called nursing home, as patient's story was not reliable and it was hard to tell whether he had syncope or a fall. Nursing home reported that when they went to check on him he was on the floor. They dont know how long, but he had been checked few minutes prior about food. They also reported that pt has had fall in the past when his meds were changed.  In the interim, pt's ekg looks normal. He has mild CHF if any. He has no chest pain, dib, palpitations. Head and cspine cleared clinically. No signs of DVT. No concerns for PE. No signs of severe trauma that would requiring imaging. We will monitor patient in the ER for 2+ hours on telemetry. If tele monitoring is normal, we will d/c.   Final Clinical Impressions(s) / ED Diagnoses   Final diagnoses:  Fall, initial encounter    New Prescriptions Discharge Medication List as of 01/09/2016  4:22 PM       Derwood Kaplan, MD 01/10/16 [phone]

## 2016-01-09 NOTE — ED Triage Notes (Addendum)
[facility] after [facility] syncopal episode lasting approx 2 mins. Pt fell forward. Pt reporting pain to right rib cage, left hip, and chronic lower back pain. Pt reports he was taken off of hydromorphone yesterday. Pt a/o x 4.

## 2016-01-27 ENCOUNTER — Other Ambulatory Visit (HOSPITAL_COMMUNITY): Payer: Self-pay | Admitting: Orthopedic Surgery

## 2016-01-27 DIAGNOSIS — R296 Repeated falls: Secondary | ICD-10-CM

## 2016-01-27 DIAGNOSIS — M25552 Pain in left hip: Secondary | ICD-10-CM

## 2016-02-05 ENCOUNTER — Ambulatory Visit (HOSPITAL_COMMUNITY)
Admission: RE | Admit: 2016-02-05 | Discharge: 2016-02-05 | Disposition: A | Discharge status: Home / Self Care | Payer: Medicare Other | Source: Ambulatory Visit | Attending: Orthopedic Surgery | Admitting: Orthopedic Surgery

## 2016-02-05 ENCOUNTER — Encounter (HOSPITAL_COMMUNITY)
Admission: RE | Admit: 2016-02-05 | Discharge: 2016-02-05 | Disposition: A | Discharge status: Home / Self Care | Payer: Medicare Other | Source: Ambulatory Visit | Attending: Orthopedic Surgery | Admitting: Orthopedic Surgery

## 2016-02-05 DIAGNOSIS — R296 Repeated falls: Secondary | ICD-10-CM

## 2016-02-05 DIAGNOSIS — M25552 Pain in left hip: Secondary | ICD-10-CM

## 2016-02-05 DIAGNOSIS — Z96642 Presence of left artificial hip joint: Secondary | ICD-10-CM | POA: Diagnosis not present

## 2016-02-05 MED ORDER — TECHNETIUM TC 99M MEDRONATE IV KIT
25.0000 | PACK | Freq: Once | INTRAVENOUS | Status: AC | PRN
  Administered 2016-02-05: 25 via INTRAVENOUS

## 2016-03-15 ENCOUNTER — Ambulatory Visit: Payer: Medicare Other | Admitting: Neurology

## 2016-03-15 ENCOUNTER — Telehealth: Payer: Self-pay

## 2016-03-15 ENCOUNTER — Telehealth: Payer: Self-pay | Admitting: Neurology

## 2016-03-15 NOTE — Telephone Encounter (Signed)
This patient did not show for a revisit appointment today. 

## 2016-03-15 NOTE — Telephone Encounter (Signed)
Pt no-showed his follow-up appt this afternoon. 

## 2016-03-16 ENCOUNTER — Encounter: Payer: Self-pay | Admitting: Neurology

## 2016-05-20 ENCOUNTER — Ambulatory Visit (INDEPENDENT_AMBULATORY_CARE_PROVIDER_SITE_OTHER): Payer: Medicare Other | Admitting: Neurology

## 2016-05-20 ENCOUNTER — Encounter: Payer: Self-pay | Admitting: Neurology

## 2016-05-20 VITALS — BP 112/78 | HR 72 | Ht 70.0 in | Wt 290.5 lb

## 2016-05-20 DIAGNOSIS — R269 Unspecified abnormalities of gait and mobility: Secondary | ICD-10-CM | POA: Diagnosis not present

## 2016-05-20 DIAGNOSIS — R569 Unspecified convulsions: Secondary | ICD-10-CM

## 2016-05-20 NOTE — Progress Notes (Signed)
Reason for visit: Seizures  Alex Mcintosh is an 66 y.o. male  History of present illness:  Alex Mcintosh is a 66 year old right-handed white male with a history of seizures and pseudoseizures. The patient has been residing in an extended care facility, he is on Keppra taking 1500 mg in the morning and 1000 mg in the evening. He has an underlying memory problem and he comes to the office today by himself. The patient claims that he has not had any falls or seizures since last seen, but records indicate that he was in the emergency room on 01/09/2016 following a fall. It is not clear whether this was a seizure or not. The patient uses a walker for ambulation, he is relatively stable with using this. He denies any other significant issues at this time. He claims that he has been tapered off of his hydrocodone and that we he will be coming off of Soma in the near future. He is sent back to this office for an evaluation. He does not operate a motor vehicle.  Past Medical History:  Diagnosis Date  . Abnormality of gait 08/03/2013  . Acute encephalopathy 09/06/12   was admitted to [facility]  . Anxiety   . Arthritis   . 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 Cervicogenic headache 10/26/2013  . CHF (congestive heart failure) (HCC)    diurects stopped in April  . Chronic back pain   . Chronic narcotic dependence (HCC)   . Depression   . DVT (deep venous thrombosis) (HCC)    01/2012  . Foot drop, bilateral   . GERD (gastroesophageal reflux disease)   . History of cerebrovascular disease   . Hyperlipemia   . Hypothyroidism   . Memory loss of unknown cause   . Myoclonus   . Neuropathy (HCC)    legs  . Obese   . Seizures (HCC)    Myoclonus  . Shortness of breath    with exertion  . UTI (lower urinary tract infection) 5/14   was confused and was found in [place].    Past Surgical History:  Procedure Laterality Date  . BACK SURGERY     4 lumbar  . CERVICAL FUSION    . HIP SURGERY Left     . JOINT REPLACEMENT    . KNEE ARTHROSCOPY Right   . PAIN PUMP IMPLANTATION  Reports no longer has pump  . TOOTH EXTRACTION N/A 11/16/2012   Procedure: DENTAL EXTRACTIONS OF MULTIPLE NONRESTORABLE TEETH;  Surgeon: Hinton Dyer, DDS;  Location: [facility] OR;  Service: Oral Surgery;  Laterality: N/A;  #12, 14, 15, 22, 23, 24, 25, 26, 27, 28    Family History  Problem Relation Age of Onset  . CAD Father   . Heart disease Father   . Heart failure Mother   . Breast cancer Sister   . CAD Brother     Social history:  reports that he has never smoked. He has never used smokeless tobacco. He reports that he does not drink alcohol or use drugs.    Allergies  Allergen Reactions  . Iodinated Diagnostic Agents Other (See Comments)    Patient does not recollect  . Latex Other (See Comments)    Patient does not recollect  . Statins Other (See Comments)    Causes elevation of liver enzymes  . Ceftin [Cefuroxime Axetil] Hives and Rash    Medications:  Prior to Admission medications   Medication Sig Start  Date End Date Taking? Authorizing Provider  acetaminophen (TYLENOL) 500 MG tablet Take 500 mg by mouth.   Yes Historical Provider, MD  ARIPiprazole (ABILIFY) 5 MG tablet Take 5 mg by mouth daily.   Yes Historical Provider, MD  carisoprodol (SOMA) 350 MG tablet TAKE 350 MG  BY MOUTH 3 TIMES A DAY 11/22/14  Yes Historical Provider, MD  diazepam (VALIUM) 5 MG tablet Take 5 mg by mouth 2 (two) times daily.  12/13/14  Yes Historical Provider, MD  diphenhydrAMINE (BENADRYL) 25 mg capsule Take 50 mg by mouth at bedtime as needed for sleep.   Yes Historical Provider, MD  DULoxetine (CYMBALTA) 60 MG capsule Take 2 capsules (120 mg total) by mouth every morning. 01/06/15  Yes York Spaniel, MD  ezetimibe (ZETIA) 10 MG tablet Take 10 mg by mouth daily.   Yes Historical Provider, MD  fentaNYL (DURAGESIC - DOSED MCG/HR) 50 MCG/HR Place onto the skin. 05/12/16  Yes Historical Provider, MD  furosemide (LASIX) 20  MG tablet Take 20 mg by mouth.   Yes Historical Provider, MD  HYDROcodone-homatropine (HYCODAN) 5-1.5 MG/5ML syrup Take by mouth.   Yes Historical Provider, MD  levETIRAcetam (KEPPRA) 1000 MG tablet Take 1.5 tablets in the AM and 1 tablet in the PM Patient taking differently: Take 1,500 mg by mouth at bedtime.  09/11/15  Yes Butch Penny, NP  Menthol, Topical Analgesic, (BIOFREEZE EX) Apply 1 application topically 2 (two) times daily as needed (pain).    Yes Historical Provider, MD  Multiple Vitamin (MULTIVITAMIN) tablet Take 1 tablet by mouth daily.   Yes Historical Provider, MD  promethazine (PHENERGAN) 25 MG tablet Take 25 mg by mouth every 8 (eight) hours as needed for nausea or vomiting.   Yes Historical Provider, MD  propranolol (INDERAL) 20 MG tablet TAKE 1 TABLET BY MOUTH TWICE A DAY Patient taking differently: TAKE 20 MG BY MOUTH TWICE DAILY 07/15/15  Yes York Spaniel, MD  promethazine (PHENERGAN) 25 MG tablet Take 25 mg by mouth daily as needed. 09/19/15 09/26/15  Historical Provider, MD  traZODone (DESYREL) 50 MG tablet Take 50 mg by mouth.    Historical Provider, MD    ROS:  Out of a complete 14 system review of symptoms, the patient complains only of the following symptoms, and all other reviewed systems are negative.  Fatigue History of seizures Gait disturbance  Blood pressure 112/78, pulse 72, height 5\' 10"  (1.778 m), weight 290 lb 8 oz (131.8 kg).  Physical Exam  General: The patient is alert and cooperative at the time of the examination. The patient is markedly obese.  Skin: No significant peripheral edema is noted.   Neurologic Exam  Mental status: The patient is alert and oriented x 3 at the time of the examination. The patient has apparent normal recent and remote memory, with an apparently normal attention span and concentration ability.   Cranial nerves: Facial symmetry is present. Speech is normal, no aphasia or dysarthria is noted. Extraocular movements  are full. Visual fields are full. Mild masking of the face is seen.  Motor: The patient has good strength in all 4 extremities.  Sensory examination: Soft touch sensation is symmetric on the face, arms, and legs.  Coordination: The patient has good finger-nose-finger and heel-to-shin bilaterally.  Gait and station: The patient has a minimally wide-based gait, the patient usually uses a walker for ambulation. Tandem gait is slightly unsteady. Romberg is negative. No drift is seen.  Reflexes: Deep tendon reflexes are symmetric.  Assessment/Plan:  1. History of seizures and pseudoseizures  2. Gait disorder  The patient is doing well at this time, he does not report any significant issues with epileptic events or with falls, but he does have an underlying memory issue and he comes to the office today alone. There are no caretakers with him to verify his history. The patient will continue his current medication dosing. 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 The patient will follow-up in one year, sooner if needed.  Marlan Palau. Keith Willis MD 05/20/2016 11:38 AM  [facility] [address] [place], [place] [id number]  Phone [phone] Fax [phone]

## 2017-01-22 IMAGING — NM NM BONE 3 PHASE
10 series · 20 of 20 positions shown · non-contrast
Comparison: CT 10/08/2015

CLINICAL DATA: LEFT hip pain. LEFT hip replacement 4894. RIGHT knee
replacement.

EXAM:
NUCLEAR MEDICINE 3-PHASE BONE SCAN
TECHNIQUE: Radionuclide angiographic images, immediate static blood pool
images, and 3-hour delayed static images were obtained of the hips
after intravenous injection of radiopharmaceutical.
RADIOPHARMACEUTICALS:  25.4 mCi Vc-44m MDP

[Series 1: flow · 2.07mm/px · 6 of 48 frames shown (1 of 2)]
[frame 5/48  full-range]
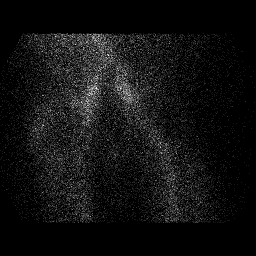
[frame 13/48  full-range]
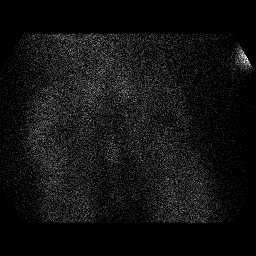
[frame 21/48  full-range]
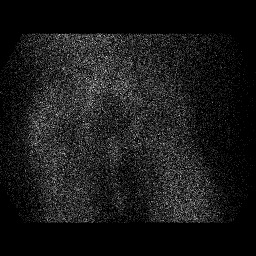
[frame 29/48  full-range]
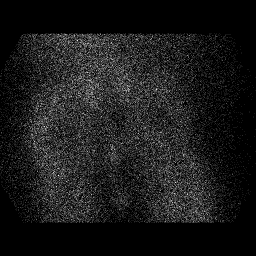
[frame 37/48  full-range]
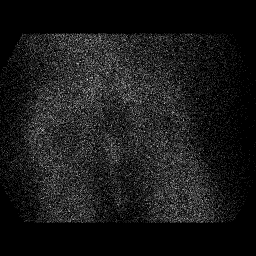
[frame 45/48  full-range]
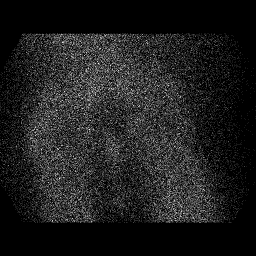

[Series 1: flow · 2.07mm/px · 6 of 48 frames shown (2 of 2)]
[frame 5/48  full-range]
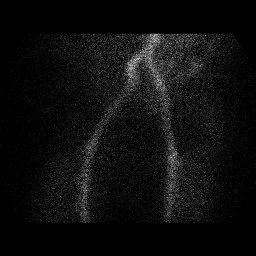
[frame 13/48  full-range]
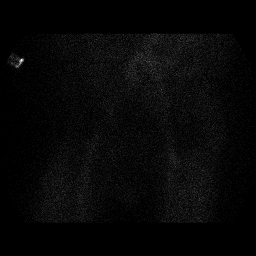
[frame 21/48  full-range]
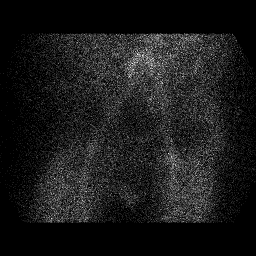
[frame 29/48  full-range]
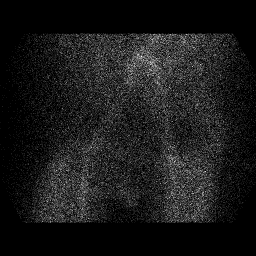
[frame 37/48  full-range]
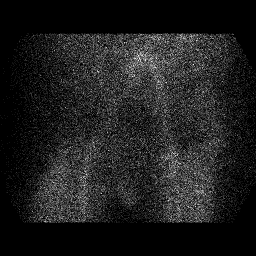
[frame 45/48  full-range]
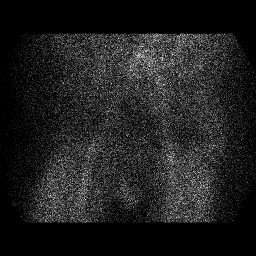

[Series 2: blood pool · 2.07mm/px · 1 of 1 slices shown (1 of 6)]
[im 1/1]
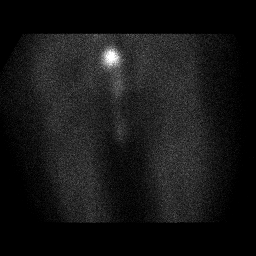

[Series 2: blood pool · 2.07mm/px · 1 of 1 slices shown (2 of 6)]
[im 1/1]
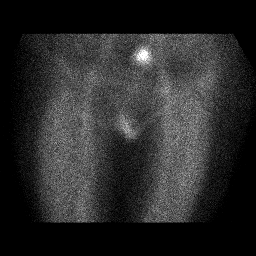

[Series 3: lat bp · 2.07mm/px · 1 of 1 slices shown (1 of 2)]
[im 1/1]
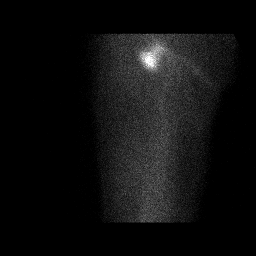

[Series 3: lat bp · 2.07mm/px · 1 of 1 slices shown (2 of 2)]
[im 1/1]
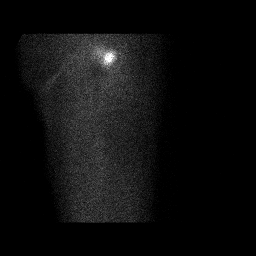

[Series 4: blood pool · 2.07mm/px · 1 of 1 slices shown (3 of 6)]
[im 1/1]
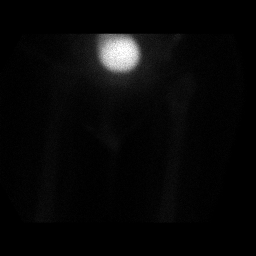

[Series 4: blood pool · 2.07mm/px · 1 of 1 slices shown (4 of 6)]
[im 1/1]
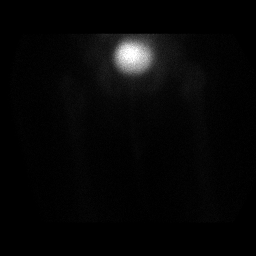

[Series 5: blood pool · 2.07mm/px · 1 of 1 slices shown (5 of 6)]
[im 1/1]
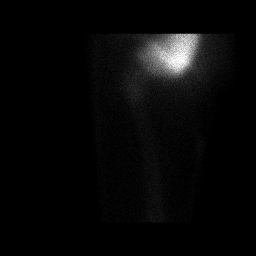

[Series 5: blood pool · 2.07mm/px · 1 of 1 slices shown (6 of 6)]
[im 1/1]
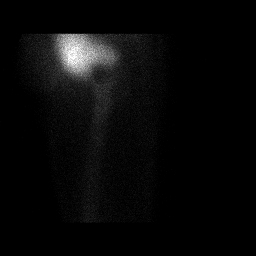

[20 of 20 positions shown; findings below may reference images not displayed]

FINDINGS: Vascular phase: No abnormal increased blood flow to the LEFT or
RIGHT hip.

Blood pool phase: No abnormal blood pool activity.

Delayed phase: No abnormal delayed activity in LEFT or RIGHT hip.
Photopenia noted in the expected location of the LEFT hip
prosthetic.
IMPRESSION: No evidence of loosening or infection of the LEFT hip prosthetic.

## 2017-05-23 ENCOUNTER — Telehealth: Payer: Self-pay | Admitting: *Deleted

## 2017-05-23 ENCOUNTER — Ambulatory Visit: Payer: Medicare Other | Admitting: Adult Health

## 2017-05-23 NOTE — Telephone Encounter (Signed)
Patient was no show for follow up today with NP. 

## 2017-05-24 ENCOUNTER — Encounter: Payer: Self-pay | Admitting: Adult Health

## 2018-07-27 HISTORY — PX: BREAST BIOPSY: SHX20

## 2019-07-13 ENCOUNTER — Ambulatory Visit: Payer: Medicare Other | Attending: Internal Medicine

## 2019-07-13 ENCOUNTER — Ambulatory Visit: Payer: Medicare Other

## 2019-07-13 DIAGNOSIS — Z23 Encounter for immunization: Secondary | ICD-10-CM | POA: Insufficient documentation

## 2019-07-13 NOTE — Progress Notes (Signed)
   Covid-19 Vaccination Clinic  Name:  Alex Mcintosh    MRN: [id number] DOB: June 26, 1950  07/13/2019  Mr. Dowers was observed post Covid-19 immunization for 15 minutes without incident. He was provided with Vaccine Information Sheet and instruction to access the V-Safe system.   Mr. Harbaugh was instructed to call 911 with any severe reactions post vaccine: Marland Kitchen Difficulty breathing  . Swelling of face and throat  . A fast heartbeat  . A bad rash all over body  . Dizziness and weakness   Immunizations Administered    Name Date Dose VIS Date Route   Pfizer COVID-19 Vaccine 07/13/2019  8:34 AM 0.3 mL 04/20/2019 Intramuscular   Manufacturer: [employer], [employer]   Lot: [id number]   NDC: [address]-3

## 2019-08-15 ENCOUNTER — Ambulatory Visit: Payer: Medicare Other | Attending: Internal Medicine

## 2019-08-15 DIAGNOSIS — Z23 Encounter for immunization: Secondary | ICD-10-CM

## 2019-08-15 NOTE — Progress Notes (Signed)
   Covid-19 Vaccination Clinic  Name:  Alex Mcintosh    MRN: [id number] DOB: 1950/12/24  08/15/2019  Alex Mcintosh was observed post Covid-19 immunization for 15 minutes without incident. He was provided with Vaccine Information Sheet and instruction to access the V-Safe system.   Alex Mcintosh was instructed to call 911 with any severe reactions post vaccine: Marland Kitchen Difficulty breathing  . Swelling of face and throat  . A fast heartbeat  . A bad rash all over body  . Dizziness and weakness   Immunizations Administered    Name Date Dose VIS Date Route   Pfizer COVID-19 Vaccine 08/15/2019  8:59 AM 0.3 mL 04/20/2019 Intramuscular   Manufacturer: [employer], [employer]   Lot: [id number]   NDC: [address]-7

## 2019-09-26 ENCOUNTER — Other Ambulatory Visit: Payer: Self-pay | Admitting: Family Medicine

## 2019-09-26 DIAGNOSIS — Z8489 Family history of other specified conditions: Secondary | ICD-10-CM

## 2019-09-26 DIAGNOSIS — N63 Unspecified lump in unspecified breast: Secondary | ICD-10-CM

## 2019-10-02 ENCOUNTER — Other Ambulatory Visit: Payer: Medicare Other

## 2019-10-04 ENCOUNTER — Other Ambulatory Visit: Payer: Medicare Other

## 2019-10-18 ENCOUNTER — Other Ambulatory Visit: Payer: Self-pay

## 2019-10-18 ENCOUNTER — Ambulatory Visit
Admission: RE | Admit: 2019-10-18 | Discharge: 2019-10-18 | Disposition: A | Discharge status: Home / Self Care | Payer: Medicare Other | Source: Ambulatory Visit | Attending: Family Medicine | Admitting: Family Medicine

## 2019-10-18 DIAGNOSIS — Z8489 Family history of other specified conditions: Secondary | ICD-10-CM

## 2019-10-18 DIAGNOSIS — N63 Unspecified lump in unspecified breast: Secondary | ICD-10-CM
